# Patient Record
Sex: Male | Born: 1950 | Race: White | Hispanic: No | Marital: Married | Smoking: Never smoker
Health system: Southern US, Community
[De-identification: ages and names within clinical notes are randomized; demographics above are authoritative.]

## PROBLEM LIST (undated history)

## (undated) DIAGNOSIS — R42 Dizziness and giddiness: Secondary | ICD-10-CM

## (undated) DIAGNOSIS — E669 Obesity, unspecified: Secondary | ICD-10-CM

## (undated) DIAGNOSIS — E119 Type 2 diabetes mellitus without complications: Secondary | ICD-10-CM

## (undated) DIAGNOSIS — Z85820 Personal history of malignant melanoma of skin: Secondary | ICD-10-CM

## (undated) DIAGNOSIS — Z79899 Other long term (current) drug therapy: Secondary | ICD-10-CM

## (undated) DIAGNOSIS — K5900 Constipation, unspecified: Secondary | ICD-10-CM

## (undated) DIAGNOSIS — D72829 Elevated white blood cell count, unspecified: Secondary | ICD-10-CM

## (undated) DIAGNOSIS — D649 Anemia, unspecified: Secondary | ICD-10-CM

## (undated) DIAGNOSIS — M1990 Unspecified osteoarthritis, unspecified site: Secondary | ICD-10-CM

## (undated) DIAGNOSIS — Z85828 Personal history of other malignant neoplasm of skin: Secondary | ICD-10-CM

## (undated) DIAGNOSIS — Z8589 Personal history of malignant neoplasm of other organs and systems: Secondary | ICD-10-CM

## (undated) DIAGNOSIS — K219 Gastro-esophageal reflux disease without esophagitis: Secondary | ICD-10-CM

## (undated) DIAGNOSIS — F32A Depression, unspecified: Secondary | ICD-10-CM

## (undated) DIAGNOSIS — F5104 Psychophysiologic insomnia: Secondary | ICD-10-CM

## (undated) DIAGNOSIS — E785 Hyperlipidemia, unspecified: Secondary | ICD-10-CM

## (undated) DIAGNOSIS — N528 Other male erectile dysfunction: Secondary | ICD-10-CM

## (undated) DIAGNOSIS — F419 Anxiety disorder, unspecified: Secondary | ICD-10-CM

## (undated) DIAGNOSIS — F329 Major depressive disorder, single episode, unspecified: Secondary | ICD-10-CM

## (undated) HISTORY — DX: Major depressive disorder, single episode, unspecified: F32.9

## (undated) HISTORY — DX: Anxiety disorder, unspecified: F41.9

## (undated) HISTORY — DX: Hyperlipidemia, unspecified: E78.5

## (undated) HISTORY — DX: Other long term (current) drug therapy: Z79.899

## (undated) HISTORY — DX: Constipation, unspecified: K59.00

## (undated) HISTORY — DX: Obesity, unspecified: E66.9

## (undated) HISTORY — DX: Depression, unspecified: F32.A

## (undated) HISTORY — DX: Psychophysiologic insomnia: F51.04

## (undated) HISTORY — DX: Personal history of malignant melanoma of skin: Z85.820

## (undated) HISTORY — DX: Other male erectile dysfunction: N52.8

## (undated) HISTORY — DX: Personal history of malignant neoplasm of other organs and systems: Z85.89

## (undated) HISTORY — DX: Elevated white blood cell count, unspecified: D72.829

## (undated) HISTORY — DX: Unspecified osteoarthritis, unspecified site: M19.90

## (undated) HISTORY — DX: Anemia, unspecified: D64.9

## (undated) HISTORY — DX: Gastro-esophageal reflux disease without esophagitis: K21.9

## (undated) HISTORY — DX: Type 2 diabetes mellitus without complications: E11.9

## (undated) HISTORY — DX: Personal history of other malignant neoplasm of skin: Z85.828

## (undated) HISTORY — PX: NASAL SEPTUM SURGERY: SHX37

## (undated) HISTORY — PX: COLONOSCOPY: SHX174

---

## 2007-02-03 LAB — HM COLONOSCOPY

## 2007-02-15 ENCOUNTER — Ambulatory Visit: Payer: Self-pay | Admitting: Gastroenterology

## 2012-09-10 LAB — PSA: PSA: NORMAL

## 2014-08-25 LAB — LIPID PANEL
CHOLESTEROL: 139 mg/dL (ref 0–200)
HDL: 52 mg/dL (ref 35–70)
LDL Cholesterol: 51 mg/dL
Triglycerides: 182 mg/dL — AB (ref 40–160)

## 2014-09-29 ENCOUNTER — Emergency Department: Payer: Self-pay | Admitting: Emergency Medicine

## 2014-09-29 LAB — CBC
HCT: 40.4 % (ref 40.0–52.0)
HGB: 12.9 g/dL — AB (ref 13.0–18.0)
MCH: 26.8 pg (ref 26.0–34.0)
MCHC: 31.9 g/dL — ABNORMAL LOW (ref 32.0–36.0)
MCV: 84 fL (ref 80–100)
PLATELETS: 203 10*3/uL (ref 150–440)
RBC: 4.8 10*6/uL (ref 4.40–5.90)
RDW: 15.5 % — AB (ref 11.5–14.5)
WBC: 11.2 10*3/uL — AB (ref 3.8–10.6)

## 2014-09-29 LAB — BASIC METABOLIC PANEL
Anion Gap: 7 (ref 7–16)
BUN: 10 mg/dL (ref 7–18)
CALCIUM: 8.6 mg/dL (ref 8.5–10.1)
Chloride: 104 mmol/L (ref 98–107)
Co2: 30 mmol/L (ref 21–32)
Creatinine: 0.96 mg/dL (ref 0.60–1.30)
EGFR (Non-African Amer.): >60
Glucose: 191 mg/dL — ABNORMAL HIGH (ref 65–99)
Osmolality: 285 (ref 275–301)
POTASSIUM: 4.1 mmol/L (ref 3.5–5.1)
SODIUM: 141 mmol/L (ref 136–145)

## 2014-09-29 LAB — TROPONIN I: Troponin-I: <0.02 ng/mL

## 2015-04-21 LAB — HEMOGLOBIN A1C: Hgb A1c MFr Bld: 6 % (ref 4.0–6.0)

## 2015-07-13 LAB — HM DIABETES EYE EXAM

## 2015-07-19 ENCOUNTER — Encounter: Payer: Self-pay | Admitting: Family Medicine

## 2015-07-19 DIAGNOSIS — F339 Major depressive disorder, recurrent, unspecified: Secondary | ICD-10-CM | POA: Insufficient documentation

## 2015-07-19 DIAGNOSIS — N529 Male erectile dysfunction, unspecified: Secondary | ICD-10-CM | POA: Insufficient documentation

## 2015-07-19 DIAGNOSIS — E1140 Type 2 diabetes mellitus with diabetic neuropathy, unspecified: Secondary | ICD-10-CM | POA: Insufficient documentation

## 2015-07-19 DIAGNOSIS — E785 Hyperlipidemia, unspecified: Secondary | ICD-10-CM | POA: Insufficient documentation

## 2015-07-19 DIAGNOSIS — Z8619 Personal history of other infectious and parasitic diseases: Secondary | ICD-10-CM | POA: Insufficient documentation

## 2015-07-19 DIAGNOSIS — K5900 Constipation, unspecified: Secondary | ICD-10-CM | POA: Insufficient documentation

## 2015-07-19 DIAGNOSIS — Z8589 Personal history of malignant neoplasm of other organs and systems: Secondary | ICD-10-CM | POA: Insufficient documentation

## 2015-07-19 DIAGNOSIS — F419 Anxiety disorder, unspecified: Secondary | ICD-10-CM | POA: Insufficient documentation

## 2015-07-19 DIAGNOSIS — N521 Erectile dysfunction due to diseases classified elsewhere: Secondary | ICD-10-CM

## 2015-07-19 DIAGNOSIS — Z85820 Personal history of malignant melanoma of skin: Secondary | ICD-10-CM | POA: Insufficient documentation

## 2015-07-19 DIAGNOSIS — Z85828 Personal history of other malignant neoplasm of skin: Secondary | ICD-10-CM | POA: Insufficient documentation

## 2015-07-19 DIAGNOSIS — E669 Obesity, unspecified: Secondary | ICD-10-CM | POA: Insufficient documentation

## 2015-07-19 DIAGNOSIS — G4700 Insomnia, unspecified: Secondary | ICD-10-CM | POA: Insufficient documentation

## 2015-07-19 DIAGNOSIS — K219 Gastro-esophageal reflux disease without esophagitis: Secondary | ICD-10-CM | POA: Insufficient documentation

## 2015-07-23 ENCOUNTER — Ambulatory Visit: Payer: Self-pay | Admitting: Family Medicine

## 2015-07-29 ENCOUNTER — Ambulatory Visit (INDEPENDENT_AMBULATORY_CARE_PROVIDER_SITE_OTHER): Payer: Medicare Other | Admitting: Family Medicine

## 2015-07-29 ENCOUNTER — Encounter: Payer: Self-pay | Admitting: Family Medicine

## 2015-07-29 VITALS — BP 110/62 | HR 85 | Temp 98.7°F | Resp 14 | Ht 69.0 in | Wt 219.4 lb

## 2015-07-29 DIAGNOSIS — Z79899 Other long term (current) drug therapy: Secondary | ICD-10-CM | POA: Diagnosis not present

## 2015-07-29 DIAGNOSIS — E1140 Type 2 diabetes mellitus with diabetic neuropathy, unspecified: Principal | ICD-10-CM

## 2015-07-29 DIAGNOSIS — N521 Erectile dysfunction due to diseases classified elsewhere: Secondary | ICD-10-CM | POA: Diagnosis not present

## 2015-07-29 DIAGNOSIS — G4700 Insomnia, unspecified: Secondary | ICD-10-CM | POA: Diagnosis not present

## 2015-07-29 DIAGNOSIS — D649 Anemia, unspecified: Secondary | ICD-10-CM

## 2015-07-29 DIAGNOSIS — F419 Anxiety disorder, unspecified: Secondary | ICD-10-CM | POA: Diagnosis not present

## 2015-07-29 DIAGNOSIS — F339 Major depressive disorder, recurrent, unspecified: Secondary | ICD-10-CM

## 2015-07-29 DIAGNOSIS — E1149 Type 2 diabetes mellitus with other diabetic neurological complication: Secondary | ICD-10-CM | POA: Diagnosis not present

## 2015-07-29 DIAGNOSIS — M542 Cervicalgia: Secondary | ICD-10-CM | POA: Insufficient documentation

## 2015-07-29 DIAGNOSIS — E669 Obesity, unspecified: Secondary | ICD-10-CM | POA: Diagnosis not present

## 2015-07-29 DIAGNOSIS — Z23 Encounter for immunization: Secondary | ICD-10-CM

## 2015-07-29 DIAGNOSIS — N528 Other male erectile dysfunction: Secondary | ICD-10-CM | POA: Diagnosis not present

## 2015-07-29 DIAGNOSIS — E66811 Obesity, class 1: Secondary | ICD-10-CM

## 2015-07-29 DIAGNOSIS — E785 Hyperlipidemia, unspecified: Secondary | ICD-10-CM

## 2015-07-29 LAB — POCT GLYCOSYLATED HEMOGLOBIN (HGB A1C): HEMOGLOBIN A1C: 6.2

## 2015-07-29 MED ORDER — AMPHETAMINE-DEXTROAMPHETAMINE 15 MG PO TABS: 1.0000 | ORAL_TABLET | Freq: Two times a day (BID) | ORAL | Status: DC

## 2015-07-29 MED ORDER — AMPHETAMINE-DEXTROAMPHETAMINE 15 MG PO TABS: 15.0000 mg | ORAL_TABLET | Freq: Every day | ORAL | Status: DC

## 2015-07-29 MED ORDER — TIZANIDINE HCL 4 MG PO CAPS: 4.0000 mg | ORAL_CAPSULE | Freq: Three times a day (TID) | ORAL | Status: DC

## 2015-07-29 MED ORDER — SILDENAFIL CITRATE 20 MG PO TABS: 20.0000 mg | ORAL_TABLET | ORAL | Status: DC | PRN

## 2015-07-29 MED ORDER — ASPIRIN EC 81 MG PO TBEC: 81.0000 mg | DELAYED_RELEASE_TABLET | Freq: Every day | ORAL | Status: DC

## 2015-07-29 MED ORDER — ATORVASTATIN CALCIUM 80 MG PO TABS: 80.0000 mg | ORAL_TABLET | Freq: Every day | ORAL | Status: DC

## 2015-07-29 MED ORDER — ALPRAZOLAM 0.5 MG PO TABS: 0.5000 mg | ORAL_TABLET | ORAL | Status: DC | PRN

## 2015-07-29 MED ORDER — METFORMIN HCL 500 MG PO TABS: 500.0000 mg | ORAL_TABLET | Freq: Two times a day (BID) | ORAL | Status: DC

## 2015-07-29 MED ORDER — VALACYCLOVIR HCL 1 G PO TABS: 1000.0000 mg | ORAL_TABLET | ORAL | Status: DC | PRN

## 2015-07-29 MED ORDER — BUPROPION HCL ER (XL) 300 MG PO TB24: 300.0000 mg | ORAL_TABLET | Freq: Every day | ORAL | Status: DC

## 2015-07-29 MED ORDER — DOCUSATE SODIUM 100 MG PO CAPS: 100.0000 mg | ORAL_CAPSULE | Freq: Two times a day (BID) | ORAL | Status: DC

## 2015-07-29 MED ORDER — OMEPRAZOLE 40 MG PO CPDR: 40.0000 mg | DELAYED_RELEASE_CAPSULE | Freq: Every day | ORAL | Status: DC

## 2015-07-29 NOTE — Progress Notes (Signed)
Name: Diane Mochizuki.   MRN: [id number]    DOB: 01-14-1951   Date:07/29/2015       Progress Note  Subjective  Chief Complaint  Chief Complaint  Patient presents with  . Diabetes  . Depression  . Hyperlipidemia  . Gastrophageal Reflux    HPI  DMII with ED: he is taking Metformin, weight is stable, denies polyphagia, polydipsia or polyuria. He has ED but is doing well on medication  ED: taking generic Viagra and is doing well, denies side effects of medication  Major Depressive Disorder: he states he is doing well, taking his medications, doing well with his new routine since his wife retired last year. He takes Adderal to help with his energy and focus, he is happy with therapy at this time.   GERD: he takes medication daily, if he skips medication symptoms are severe , he is aware of possible side effects of medication.   Neck pain: he has noticed that his neck is very sore on the left side for the past few months, worse at the end of the day, feels tight.  He needs to shift his position to make it better  Patient Active Problem List   Diagnosis Date Noted  . Anxiety 07/19/2015  . Insomnia, persistent 07/19/2015  . CN (constipation) 07/19/2015  . 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 Dyslipidemia 07/19/2015  . Gastric reflux 07/19/2015  . History of shingles 07/19/2015  . History of basal cell cancer 07/19/2015  . H/O Malignant melanoma 07/19/2015  . Personal history of malignant neoplasm of head and neck 07/19/2015  . Chronic recurrent major depressive disorder 07/19/2015  . Obesity (BMI 30.0-34.9) 07/19/2015  . ED (erectile dysfunction) 07/19/2015    Past Surgical History  Procedure Laterality Date  . Nasal septum surgery      Family History  Problem Relation Age of Onset  . Diabetes Mother   . Heart disease Mother   . Hyperlipidemia Mother   . CVA Father   . Cancer Father     Colon  . Depression Father     Social  History   Social History  . Marital Status: Married    Spouse Name: N/A  . Number of Children: N/A  . Years of Education: N/A   Occupational History  . Not on file.   Social History Main Topics  . Smoking status: Never Smoker   . Smokeless tobacco: Never Used  . Alcohol Use: 0.0 oz/week    0 Standard drinks or equivalent per week  . Drug Use: No  . Sexual Activity: Yes   Other Topics Concern  . Not on file   Social History Narrative     Current outpatient prescriptions:  .  ALPRAZolam (XANAX) 0.5 MG tablet, Take 1 tablet (0.5 mg total) by mouth as needed., Disp: 60 tablet, Rfl: 2 .  amphetamine-dextroamphetamine (ADDERALL) 15 MG tablet, Take 1 tablet by mouth 2 (two) times daily., Disp: 60 tablet, Rfl: 0 .  atorvastatin (LIPITOR) 80 MG tablet, Take 1 tablet (80 mg total) by mouth daily., Disp: 90 tablet, Rfl: 1 .  buPROPion (WELLBUTRIN XL) 300 MG 24 hr tablet, Take 1 tablet (300 mg total) by mouth daily., Disp: 90 tablet, Rfl: 1 .  metFORMIN (GLUCOPHAGE) 500 MG tablet, Take 1 tablet (500 mg total) by mouth 2 (two) times daily., Disp: 90 tablet, Rfl: 1 .  omeprazole (PRILOSEC) 40 MG capsule, Take 1 capsule (40 mg total) by mouth daily., Disp: 90  capsule, Rfl: 1 .  sildenafil (REVATIO) 20 MG tablet, Take 1 tablet (20 mg total) by mouth as needed., Disp: 20 tablet, Rfl: 5 .  valACYclovir (VALTREX) 1000 MG tablet, Take 1 tablet (1,000 mg total) by mouth as needed., Disp: 30 tablet, Rfl: 0 .  amphetamine-dextroamphetamine (ADDERALL) 15 MG tablet, Take 1 tablet by mouth daily., Disp: 60 tablet, Rfl: 0 .  amphetamine-dextroamphetamine (ADDERALL) 15 MG tablet, Take 1 tablet by mouth daily., Disp: 60 tablet, Rfl: 0 .  aspirin EC 81 MG tablet, Take 1 tablet (81 mg total) by mouth daily., Disp: 30 tablet, Rfl: 0 .  docusate sodium (COLACE) 100 MG capsule, Take 1 capsule (100 mg total) by mouth 2 (two) times daily., Disp: 30 capsule, Rfl: 0 .  tiZANidine (ZANAFLEX) 4 MG capsule, Take 1  capsule (4 mg total) by mouth 3 (three) times daily., Disp: 90 capsule, Rfl: 1  No Known Allergies   ROS  Constitutional: Negative for fever or weight change.  Respiratory: Negative for cough and shortness of breath.   Cardiovascular: Negative for chest pain or palpitations.  Gastrointestinal: Negative for abdominal pain, no bowel changes.  Musculoskeletal: Negative for gait problem or joint swelling.  Skin: Negative for rash.  Neurological: Negative for dizziness or headache.  No other specific complaints in a complete review of systems (except as listed in HPI above).  Objective  Filed Vitals:   07/29/15 1207  BP: 110/62  Pulse: 85  Temp: 98.7 F (37.1 C)  TempSrc: Oral  Resp: 14  Height: 5\' 9"  (1.753 m)  Weight: 219 lb 6.4 oz (99.519 kg)  SpO2: 95%    Body mass index is 32.38 kg/(m^2).  Physical Exam  Constitutional: Patient appears well-developed and well-nourished. Obese  No distress.  HEENT: head atraumatic, normocephalic, pupils equal and reactive to light, neck supple, throat within normal limits Cardiovascular: Normal rate, regular rhythm and normal heart sounds.  No murmur heard. No BLE edema. Pulmonary/Chest: Effort normal and breath sounds normal. No respiratory distress. Abdominal: Soft.  There is no tenderness. Psychiatric: Patient has a normal mood and affect. behavior is normal. Judgment and thought content normal. Muscular Skeletal: neck pain on left lateral area around C-7, normal ROM of neck  Recent Results (from the past 2160 hour(s))  HM DIABETES EYE EXAM     Status: None   Collection Time: 07/13/15 12:00 AM  Result Value Ref Range   HM Diabetic Eye Exam No Retinopathy No Retinopathy    Comment: Done by [facility], and recommends yearly exam  POCT HgB A1C     Status: Abnormal   Collection Time: 07/29/15 12:19 PM  Result Value Ref Range   Hemoglobin A1C 6.2     Diabetic Foot Exam: Diabetic Foot Exam - Simple   Simple Foot Form   Visual Inspection  No deformities, no ulcerations, no other skin breakdown bilaterally:  Yes  Sensation Testing  Intact to touch and monofilament testing bilaterally:  Yes  Pulse Check  Posterior Tibialis and Dorsalis pulse intact bilaterally:  Yes  Comments      PHQ2/9: Depression screen PHQ 2/9 07/29/2015  Decreased Interest 1  Down, Depressed, Hopeless 1  PHQ - 2 Score 2  Altered sleeping 0  Tired, decreased energy 1  Change in appetite 1  Feeling bad or failure about yourself  0  Trouble concentrating 0  Moving slowly or fidgety/restless 0  Suicidal thoughts 0  PHQ-9 Score 4  Difficult doing work/chores Not difficult at all  Doing well on medication   Fall Risk: Fall Risk  07/29/2015  Falls in the past year? No     Assessment & Plan  1. 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 Needs flu shot High dose today  3. Need for pneumococcal vaccination  - Pneumococcal conjugate vaccine 13-valent IM  4. Obesity (BMI 30.0-34.9) Discussed with the patient the risk posed by an increased BMI. Discussed importance of portion control, calorie counting and at least 150 minutes of physical activity weekly. Avoid sweet beverages and drink more water. Eat at least 6 servings of fruit and vegetables daily   5. Chronic recurrent major depressive disorder Doing well on medication   6. Dyslipidemia  - Lipid panel  7. Insomnia, persistent Sleeping well now  8. Anemia, unspecified anemia type  - CBC with Differential/Platelet  9. Other male erectile dysfunction Continue generic Viagra  10. Long-term use of high-risk medication  - Comprehensive metabolic panel  11. Anxiety Continue Alprazolam prn

## 2015-08-31 ENCOUNTER — Telehealth: Payer: Self-pay

## 2015-08-31 NOTE — Telephone Encounter (Signed)
Pharmacist called regarding Adderall prescription, stating one of the prescriptions stated take 1 tablet two times daily, but the other two prescriptions stated one tablet daily with quantity 60 for 30 days. I informed the pharmacist from the old chart in [employer] patient has always taken 1 tablet two times daily and Dr. Ancil Boozer probably just had the wrong instructions on the other two prescriptions, but the first one seemed to be right with what he was always given in the past.

## 2015-09-30 DIAGNOSIS — Z85828 Personal history of other malignant neoplasm of skin: Secondary | ICD-10-CM | POA: Insufficient documentation

## 2015-10-05 ENCOUNTER — Other Ambulatory Visit: Payer: Self-pay | Admitting: Family Medicine

## 2015-10-05 NOTE — Telephone Encounter (Signed)
Patient requesting refill. 

## 2015-10-12 ENCOUNTER — Telehealth: Payer: Self-pay | Admitting: Family Medicine

## 2015-10-12 ENCOUNTER — Other Ambulatory Visit: Payer: Self-pay | Admitting: Family Medicine

## 2015-10-12 MED ORDER — ALPRAZOLAM 0.5 MG PO TABS: 0.5000 mg | ORAL_TABLET | Freq: Two times a day (BID) | ORAL | Status: DC | PRN

## 2015-10-12 NOTE — Telephone Encounter (Signed)
Shane Boyd from [place] need new script for [facility]. [name] usually take it twice daily, however, they received a hard copy with directions for him to take it by mouth once daily with a quantity of 60. He will need a new prescription for the patient to take it twice daily #60 for insurance to cover.

## 2015-10-12 NOTE — Telephone Encounter (Signed)
Called in.

## 2015-11-07 LAB — COMPREHENSIVE METABOLIC PANEL
A/G RATIO: 2 (ref 1.1–2.5)
ALBUMIN: 4.3 g/dL (ref 3.6–4.8)
ALK PHOS: 118 IU/L — AB (ref 39–117)
ALT: 15 IU/L (ref 0–44)
AST: 22 IU/L (ref 0–40)
BUN / CREAT RATIO: 14 (ref 10–22)
BUN: 13 mg/dL (ref 8–27)
Bilirubin Total: 0.3 mg/dL (ref 0.0–1.2)
CO2: 25 mmol/L (ref 18–29)
CREATININE: 0.91 mg/dL (ref 0.76–1.27)
Calcium: 9.4 mg/dL (ref 8.6–10.2)
Chloride: 102 mmol/L (ref 97–106)
GFR calc Af Amer: 101 mL/min/{1.73_m2} (ref >59)
GFR, EST NON AFRICAN AMERICAN: 88 mL/min/{1.73_m2} (ref >59)
GLOBULIN, TOTAL: 2.1 g/dL (ref 1.5–4.5)
Glucose: 99 mg/dL (ref 65–99)
Potassium: 5.1 mmol/L (ref 3.5–5.2)
SODIUM: 144 mmol/L (ref 136–144)
Total Protein: 6.4 g/dL (ref 6.0–8.5)

## 2015-11-07 LAB — CBC WITH DIFFERENTIAL/PLATELET
BASOS: 0 %
Basophils Absolute: 0 10*3/uL (ref 0.0–0.2)
EOS (ABSOLUTE): 0.2 10*3/uL (ref 0.0–0.4)
EOS: 3 %
HEMATOCRIT: 39.7 % (ref 37.5–51.0)
Hemoglobin: 13.2 g/dL (ref 12.6–17.7)
Immature Grans (Abs): 0 10*3/uL (ref 0.0–0.1)
Immature Granulocytes: 0 %
LYMPHS ABS: 2.7 10*3/uL (ref 0.7–3.1)
Lymphs: 40 %
MCH: 26.8 pg (ref 26.6–33.0)
MCHC: 33.2 g/dL (ref 31.5–35.7)
MCV: 81 fL (ref 79–97)
MONOS ABS: 0.5 10*3/uL (ref 0.1–0.9)
Monocytes: 8 %
Neutrophils Absolute: 3.5 10*3/uL (ref 1.4–7.0)
Neutrophils: 49 %
Platelets: 235 10*3/uL (ref 150–379)
RBC: 4.92 x10E6/uL (ref 4.14–5.80)
RDW: 15.3 % (ref 12.3–15.4)
WBC: 6.9 10*3/uL (ref 3.4–10.8)

## 2015-11-07 LAB — HEMOGLOBIN A1C
Est. average glucose Bld gHb Est-mCnc: 140 mg/dL
Hgb A1c MFr Bld: 6.5 % — ABNORMAL HIGH (ref 4.8–5.6)

## 2015-11-07 LAB — LIPID PANEL
CHOLESTEROL TOTAL: 148 mg/dL (ref 100–199)
Chol/HDL Ratio: 3.8 ratio units (ref 0.0–5.0)
HDL: 39 mg/dL — AB (ref >39)
LDL Calculated: 60 mg/dL (ref 0–99)
TRIGLYCERIDES: 246 mg/dL — AB (ref 0–149)
VLDL CHOLESTEROL CAL: 49 mg/dL — AB (ref 5–40)

## 2015-11-11 ENCOUNTER — Ambulatory Visit (INDEPENDENT_AMBULATORY_CARE_PROVIDER_SITE_OTHER): Payer: Medicare Other | Admitting: Family Medicine

## 2015-11-11 ENCOUNTER — Encounter: Payer: Self-pay | Admitting: Family Medicine

## 2015-11-11 VITALS — BP 118/76 | HR 92 | Temp 98.0°F | Resp 18 | Ht 69.0 in | Wt 215.6 lb

## 2015-11-11 DIAGNOSIS — N529 Male erectile dysfunction, unspecified: Secondary | ICD-10-CM

## 2015-11-11 DIAGNOSIS — E1140 Type 2 diabetes mellitus with diabetic neuropathy, unspecified: Principal | ICD-10-CM

## 2015-11-11 DIAGNOSIS — E1149 Type 2 diabetes mellitus with other diabetic neurological complication: Secondary | ICD-10-CM | POA: Diagnosis not present

## 2015-11-11 DIAGNOSIS — E785 Hyperlipidemia, unspecified: Secondary | ICD-10-CM

## 2015-11-11 DIAGNOSIS — G4700 Insomnia, unspecified: Secondary | ICD-10-CM

## 2015-11-11 DIAGNOSIS — K219 Gastro-esophageal reflux disease without esophagitis: Secondary | ICD-10-CM | POA: Diagnosis not present

## 2015-11-11 DIAGNOSIS — Z23 Encounter for immunization: Secondary | ICD-10-CM

## 2015-11-11 DIAGNOSIS — N521 Erectile dysfunction due to diseases classified elsewhere: Secondary | ICD-10-CM

## 2015-11-11 DIAGNOSIS — F339 Major depressive disorder, recurrent, unspecified: Secondary | ICD-10-CM

## 2015-11-11 DIAGNOSIS — M542 Cervicalgia: Secondary | ICD-10-CM | POA: Diagnosis not present

## 2015-11-11 MED ORDER — METFORMIN HCL 500 MG PO TABS: 500.0000 mg | ORAL_TABLET | Freq: Two times a day (BID) | ORAL | Status: DC

## 2015-11-11 MED ORDER — AMPHETAMINE-DEXTROAMPHETAMINE 15 MG PO TABS: 15.0000 mg | ORAL_TABLET | Freq: Every day | ORAL | Status: DC

## 2015-11-11 MED ORDER — TIZANIDINE HCL 4 MG PO CAPS: ORAL_CAPSULE | ORAL | Status: DC

## 2015-11-11 MED ORDER — BUPROPION HCL ER (XL) 300 MG PO TB24: 300.0000 mg | ORAL_TABLET | Freq: Every day | ORAL | Status: DC

## 2015-11-11 MED ORDER — AMPHETAMINE-DEXTROAMPHETAMINE 15 MG PO TABS: 1.0000 | ORAL_TABLET | Freq: Two times a day (BID) | ORAL | Status: DC

## 2015-11-11 MED ORDER — OMEPRAZOLE 40 MG PO CPDR: 40.0000 mg | DELAYED_RELEASE_CAPSULE | Freq: Every day | ORAL | Status: DC

## 2015-11-11 MED ORDER — ATORVASTATIN CALCIUM 80 MG PO TABS: 80.0000 mg | ORAL_TABLET | Freq: Every day | ORAL | Status: DC

## 2015-11-11 NOTE — Progress Notes (Signed)
Name: Shane Boyd.   MRN: [id number]    DOB: March 02, 1951   Date:11/11/2015       Progress Note  Subjective  Chief Complaint  Chief Complaint  Patient presents with  . Medication Refill    follow-up  . Diabetes  . Depression  . Hyperlipidemia  . 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 He has ED but is doing well on medication. He states that he has not been compliant with his diet, eating potatoes, bread and candy bar. HgbA1C has increased but still at goal.  ED: taking generic Viagra and is doing well, denies side effects of medication.   Major Depressive Disorder: he states he is doing well, taking his medications, doing well with his new routine since his wife retired last year. He takes Adderal to help with his energy and focus, he is happy with therapy at this time. He has been racking his yard three times weekly. They are trying to motivate each other to join [employer] sne[employer] in 2017  GERD: he takes medication daily, if he skips medication symptoms are severe , he is aware of possible side effects of medication.He does not want to try weaning self off at this time   Neck pain: he has noticed that his neck was very sore on the left side about 6 months ago, on his last visit we gave him Zanaflex and he states he is doing much better, symptoms are intermittent. Described as a sore , spasms sometimes.   Patient Active Problem List   Diagnosis Date Noted  . Neck pain 07/29/2015  . Anxiety 07/19/2015  . Insomnia, persistent 07/19/2015  . CN (constipation) 07/19/2015  . 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 Dyslipidemia 07/19/2015  . Gastric reflux 07/19/2015  . History of shingles 07/19/2015  . History of basal cell cancer 07/19/2015  . H/O Malignant melanoma 07/19/2015  . Personal history of malignant neoplasm of head and neck 07/19/2015  .  Chronic recurrent major depressive disorder ([place]) 07/19/2015  . Obesity (BMI 30.0-34.9) 07/19/2015  . ED (erectile dysfunction) 07/19/2015    Past Surgical History  Procedure Laterality Date  . Nasal septum surgery      Family History  Problem Relation Age of Onset  . Diabetes Mother   . Heart disease Mother   . Hyperlipidemia Mother   . CVA Father   . Cancer Father     Colon  . Depression Father     Social History   Social History  . Marital Status: Married    Spouse Name: N/A  . Number of Children: N/A  . Years of Education: N/A   Occupational History  . Not on file.   Social History Main Topics  . Smoking status: Never Smoker   . Smokeless tobacco: Never Used  . Alcohol Use: 0.0 oz/week    0 Standard drinks or equivalent per week  . Drug Use: No  . Sexual Activity: Yes   Other Topics Concern  . Not on file   Social History Narrative     Current outpatient prescriptions:  .  ALPRAZolam (XANAX) 0.5 MG tablet, Take 1 tablet (0.5 mg total) by mouth 2 (two) times daily as needed., Disp: 60 tablet, Rfl: 2 .  amphetamine-dextroamphetamine (ADDERALL) 15 MG tablet, Take 1 tablet by mouth 2 (two) times daily., Disp: 60 tablet, Rfl: 0 .  amphetamine-dextroamphetamine (ADDERALL) 15 MG  tablet, Take 1 tablet by mouth daily., Disp: 60 tablet, Rfl: 0 .  amphetamine-dextroamphetamine (ADDERALL) 15 MG tablet, Take 1 tablet by mouth daily., Disp: 60 tablet, Rfl: 0 .  aspirin EC 81 MG tablet, Take 1 tablet (81 mg total) by mouth daily., Disp: 30 tablet, Rfl: 0 .  atorvastatin (LIPITOR) 80 MG tablet, Take 1 tablet (80 mg total) by mouth daily., Disp: 90 tablet, Rfl: 1 .  buPROPion (WELLBUTRIN XL) 300 MG 24 hr tablet, Take 1 tablet (300 mg total) by mouth daily., Disp: 90 tablet, Rfl: 1 .  docusate sodium (COLACE) 100 MG capsule, Take 1 capsule (100 mg total) by mouth 2 (two) times daily., Disp: 30 capsule, Rfl: 0 .  metFORMIN (GLUCOPHAGE) 500 MG tablet, Take 1 tablet (500 mg  total) by mouth 2 (two) times daily., Disp: 180 tablet, Rfl: 1 .  omeprazole (PRILOSEC) 40 MG capsule, Take 1 capsule (40 mg total) by mouth daily., Disp: 90 capsule, Rfl: 1 .  sildenafil (REVATIO) 20 MG tablet, Take 1 tablet (20 mg total) by mouth as needed., Disp: 20 tablet, Rfl: 5 .  tiZANidine (ZANAFLEX) 4 MG capsule, take 1 capsule by mouth three times a day, Disp: 90 capsule, Rfl: 2 .  valACYclovir (VALTREX) 1000 MG tablet, Take 1 tablet (1,000 mg total) by mouth as needed., Disp: 30 tablet, Rfl: 0  No Known Allergies   ROS  Constitutional: Negative for fever or weight change.  Respiratory: Negative for cough and shortness of breath.   Cardiovascular: Negative for chest pain or palpitations.  Gastrointestinal: Negative for abdominal pain, no bowel changes.  Musculoskeletal: Negative for gait problem or joint swelling.  Skin: Negative for rash.  Neurological: Negative for dizziness or headache.  No other specific complaints in a complete review of systems (except as listed in HPI above).  Objective  Filed Vitals:   11/11/15 1202  BP: 118/76  Pulse: 92  Temp: 98 F (36.7 C)  TempSrc: Oral  Resp: 18  Height: 5\' 9"  (1.753 m)  Weight: 215 lb 9.6 oz (97.796 kg)  SpO2: 96%    Body mass index is 31.82 kg/(m^2).  Physical Exam  Constitutional: Patient appears well-developed and well-nourished. Obese No distress.  HEENT: head atraumatic, normocephalic, pupils equal and reactive to light, neck supple, throat within normal limits Cardiovascular: Normal rate, regular rhythm and normal heart sounds.  No murmur heard. No BLE edema. Pulmonary/Chest: Effort normal and breath sounds normal. No respiratory distress. Abdominal: Soft.  There is no tenderness. Psychiatric: Patient has a normal mood and affect. behavior is normal. Judgment and thought content normal.  Recent Results (from the past 2160 hour(s))  Lipid panel     Status: Abnormal   Collection Time: 11/06/15  8:32 AM   Result Value Ref Range   Cholesterol, Total 148 100 - 199 mg/dL   Triglycerides 246 (H) 0 - 149 mg/dL   HDL 39 (L) >39 mg/dL   VLDL Cholesterol Cal 49 (H) 5 - 40 mg/dL   LDL Calculated 60 0 - 99 mg/dL   Chol/HDL Ratio 3.8 0.0 - 5.0 ratio units    Comment:                                   T. Chol/HDL Ratio  Men  Women                               1/2 Avg.Risk  3.4    3.3                                   Avg.Risk  5.0    4.4                                2X Avg.Risk  9.6    7.1                                3X Avg.Risk 23.4   11.0   Comprehensive metabolic panel     Status: Abnormal   Collection Time: 11/06/15  8:32 AM  Result Value Ref Range   Glucose 99 65 - 99 mg/dL   BUN 13 8 - 27 mg/dL   Creatinine, Ser 0.91 0.76 - 1.27 mg/dL   GFR calc non Af Amer 88 >59 mL/min/1.73   GFR calc Af Amer 101 >59 mL/min/1.73   BUN/Creatinine Ratio 14 10 - 22   Sodium 144 136 - 144 mmol/L    Comment: **Effective November 16, 2015 the reference interval**   for Sodium, Serum will be changing to:                                             134 - 144    Potassium 5.1 3.5 - 5.2 mmol/L   Chloride 102 97 - 106 mmol/L    Comment: **Effective November 16, 2015 the reference interval**   for Chloride, Serum will be changing to:                                              96 - 106    CO2 25 18 - 29 mmol/L   Calcium 9.4 8.6 - 10.2 mg/dL   Total Protein 6.4 6.0 - 8.5 g/dL   Albumin 4.3 3.6 - 4.8 g/dL   Globulin, Total 2.1 1.5 - 4.5 g/dL   Albumin/Globulin Ratio 2.0 1.1 - 2.5   Bilirubin Total 0.3 0.0 - 1.2 mg/dL   Alkaline Phosphatase 118 (H) 39 - 117 IU/L   AST 22 0 - 40 IU/L   ALT 15 0 - 44 IU/L  CBC with Differential/Platelet     Status: None   Collection Time: 11/06/15  8:32 AM  Result Value Ref Range   WBC 6.9 3.4 - 10.8 x10E3/uL   RBC 4.92 4.14 - 5.80 x10E6/uL   Hemoglobin 13.2 12.6 - 17.7 g/dL   Hematocrit 39.7 37.5 - 51.0 %   MCV 81 79 -  97 fL   MCH 26.8 26.6 - 33.0 pg   MCHC 33.2 31.5 - 35.7 g/dL   RDW 15.3 12.3 - 15.4 %   Platelets 235 150 - 379 x10E3/uL   Neutrophils 49 %   Lymphs 40 %   Monocytes 8 %   Eos 3 %   Basos 0 %  Neutrophils Absolute 3.5 1.4 - 7.0 x10E3/uL   Lymphocytes Absolute 2.7 0.7 - 3.1 x10E3/uL   Monocytes Absolute 0.5 0.1 - 0.9 x10E3/uL   EOS (ABSOLUTE) 0.2 0.0 - 0.4 x10E3/uL   Basophils Absolute 0.0 0.0 - 0.2 x10E3/uL   Immature Granulocytes 0 %   Immature Grans (Abs) 0.0 0.0 - 0.1 x10E3/uL  Hemoglobin A1c     Status: Abnormal   Collection Time: 11/06/15  8:32 AM  Result Value Ref Range   Hgb A1c MFr Bld 6.5 (H) 4.8 - 5.6 %    Comment:          Pre-diabetes: 5.7 - 6.4          Diabetes: >6.4          Glycemic control for adults with diabetes: <7.0    Est. average glucose Bld gHb Est-mCnc 140 mg/dL     PHQ2/9: Depression screen [facility] 2/9 11/11/2015 07/29/2015  Decreased Interest 0 1  Down, Depressed, Hopeless 1 1  PHQ - 2 Score 1 2  Altered sleeping - 0  Tired, decreased energy - 1  Change in appetite - 1  Feeling bad or failure about yourself  - 0  Trouble concentrating - 0  Moving slowly or fidgety/restless - 0  Suicidal thoughts - 0  PHQ-9 Score - 4  Difficult doing work/chores - Not difficult at all    Fall Risk: Fall Risk  11/11/2015 07/29/2015  Falls in the past year? No No    Functional Status Survey: Is the patient deaf or have difficulty hearing?: No Does the patient have difficulty seeing, even when wearing glasses/contacts?: Yes (glassses) Does the patient have difficulty concentrating, remembering, or making decisions?: No Does the patient have difficulty walking or climbing stairs?: No Does the patient have difficulty dressing or bathing?: No Does the patient have difficulty doing errands alone such as visiting a doctor's office or shopping?: No   Assessment & Plan  1. 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  Dispense: 180 tablet; Refill: 1  2. Insomnia, persistent  Continue prn medication   3. Need for pneumococcal vaccination  - Pneumococcal conjugate vaccine 13-valent IM - not given   4. Chronic recurrent major depressive disorder ([place])  Doing well on medication  - amphetamine-dextroamphetamine (ADDERALL) 15 MG tablet; Take 1 tablet by mouth 2 (two) times daily.  Dispense: 60 tablet; Refill: 0 - amphetamine-dextroamphetamine (ADDERALL) 15 MG tablet; Take 1 tablet by mouth daily.  Dispense: 60 tablet; Refill: 0 - amphetamine-dextroamphetamine (ADDERALL) 15 MG tablet; Take 1 tablet by mouth daily.  Dispense: 60 tablet; Refill: 0 - buPROPion (WELLBUTRIN XL) 300 MG 24 hr tablet; Take 1 tablet (300 mg total) by mouth daily.  Dispense: 90 tablet; Refill: 1  5. Dyslipidemia  Advised to  improve HDL patient  needs to eat tree nuts ( pecans/pistachios/almonds ) four times weekly, eat fish two times weekly  and exercise  at least 150 minutes per week   atorvastatin (LIPITOR) 80 MG tablet; Take 1 tablet (80 mg total) by mouth daily.  Dispense: 90 tablet; Refill: 1  6. Erectile dysfunction, unspecified erectile dysfunction type  Continue prn medication   7. Neck pain  Doing well with Zanaflex prn  - tiZANidine (ZANAFLEX) 4 MG capsule; take 1 capsule by mouth three times a day  Dispense: 90 capsule; Refill:  2  8. Gastric reflux  He does not want to stop medication  - omeprazole (PRILOSEC) 40 MG capsule; Take 1 capsule (40 mg total) by mouth daily.  Dispense: 90 capsule; Refill: 1

## 2015-11-18 ENCOUNTER — Other Ambulatory Visit: Payer: Self-pay | Admitting: Family Medicine

## 2015-11-18 ENCOUNTER — Telehealth: Payer: Self-pay

## 2015-11-18 DIAGNOSIS — F339 Major depressive disorder, recurrent, unspecified: Secondary | ICD-10-CM

## 2015-11-18 MED ORDER — AMPHETAMINE-DEXTROAMPHETAMINE 15 MG PO TABS: 15.0000 mg | ORAL_TABLET | Freq: Two times a day (BID) | ORAL | Status: DC

## 2015-11-18 NOTE — Telephone Encounter (Signed)
I called the pharmacy, you wrote the 2 out of the 3 adderall wrong.  So you will have to rewrite and he will have to pick up since controlled they will not take over the phone!  You wrote 1 tab qd [id number]

## 2015-11-18 NOTE — Telephone Encounter (Signed)
done

## 2015-11-23 ENCOUNTER — Telehealth: Payer: Self-pay | Admitting: Family Medicine

## 2015-11-23 NOTE — Telephone Encounter (Signed)
Patient wanted me to express his concerns about him and his wife's prescriptions being wrong.  He States him and his wife on the old rx's their metformin was written for only #90 instead of #180 and had problems with his adderall as well.  They have been corrected and the metformin was fixed at this last appointment but he said he was very concerned.

## 2015-11-23 NOTE — Telephone Encounter (Signed)
Yes I did verify and it was incorrect from the last old rx, this new one you just wrote in dec. Is correct.

## 2015-11-23 NOTE — Telephone Encounter (Signed)
Pt would like a call back about a prescription that has been written incorrectly. Pt states this is the third time over the past several weeks that his prescription has been written wrong. Metformin was written for 45 days and is supposed to be written for either 30 or 90 days.Pt is running out of this meds. He states he is not sure whats going on and wants a call back.

## 2015-11-23 NOTE — Telephone Encounter (Signed)
Did you verify? Was it incorrect?

## 2015-12-10 ENCOUNTER — Telehealth: Payer: Self-pay

## 2015-12-10 NOTE — Telephone Encounter (Signed)
Patient called due to not having any refills on his Alprazolam prescription from November and has been out since the beginning of December. I called [facility] and they informed me his prescription did not show any refills in which our system did have 2 refills. So I called in a 1 month supply with 1 refill per Dr. Ancil Boozer due to patient being out and pharmacy making this mistake.

## 2016-02-09 ENCOUNTER — Ambulatory Visit (INDEPENDENT_AMBULATORY_CARE_PROVIDER_SITE_OTHER): Payer: Medicare Other | Admitting: Family Medicine

## 2016-02-09 ENCOUNTER — Encounter: Payer: Self-pay | Admitting: Family Medicine

## 2016-02-09 VITALS — BP 120/78 | HR 101 | Temp 98.2°F | Resp 16 | Ht 69.0 in | Wt 221.5 lb

## 2016-02-09 DIAGNOSIS — G4700 Insomnia, unspecified: Secondary | ICD-10-CM

## 2016-02-09 DIAGNOSIS — E1140 Type 2 diabetes mellitus with diabetic neuropathy, unspecified: Secondary | ICD-10-CM | POA: Diagnosis not present

## 2016-02-09 DIAGNOSIS — N521 Erectile dysfunction due to diseases classified elsewhere: Secondary | ICD-10-CM | POA: Diagnosis not present

## 2016-02-09 DIAGNOSIS — Z23 Encounter for immunization: Secondary | ICD-10-CM | POA: Diagnosis not present

## 2016-02-09 DIAGNOSIS — K219 Gastro-esophageal reflux disease without esophagitis: Secondary | ICD-10-CM

## 2016-02-09 DIAGNOSIS — F339 Major depressive disorder, recurrent, unspecified: Secondary | ICD-10-CM | POA: Diagnosis not present

## 2016-02-09 DIAGNOSIS — E785 Hyperlipidemia, unspecified: Secondary | ICD-10-CM

## 2016-02-09 LAB — POCT GLYCOSYLATED HEMOGLOBIN (HGB A1C): HEMOGLOBIN A1C: 6.2

## 2016-02-09 MED ORDER — AMPHETAMINE-DEXTROAMPHETAMINE 15 MG PO TABS: 1.0000 | ORAL_TABLET | Freq: Two times a day (BID) | ORAL | Status: DC

## 2016-02-09 MED ORDER — AMPHETAMINE-DEXTROAMPHETAMINE 15 MG PO TABS: 15.0000 mg | ORAL_TABLET | Freq: Two times a day (BID) | ORAL | Status: DC

## 2016-02-09 MED ORDER — ALPRAZOLAM 1 MG PO TABS: 1.0000 mg | ORAL_TABLET | Freq: Two times a day (BID) | ORAL | Status: DC

## 2016-02-09 MED ORDER — CITALOPRAM HYDROBROMIDE 20 MG PO TABS: 20.0000 mg | ORAL_TABLET | Freq: Every day | ORAL | Status: DC

## 2016-02-09 NOTE — Progress Notes (Signed)
Name: Shane Boyd.   MRN: [id number]    DOB: [birth year]   Date:02/09/2016       Progress Note  Subjective  Chief Complaint  Chief Complaint  Patient presents with  . Diabetes    patient is here for a 48-month f/u. patient has not been checking his blood sugar. he states he has not had any negative s/s from his dm  . Insomnia  . Depression  . Erectile Dysfunction  . 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 He has ED . He states that he has not been compliant with his diet, eating potatoes, bread and candy bar. HgbA1C has improved since last visit  ED: taking generic Viagra and is doing well, denies side effects of medication.   Major Depressive Disorder: he is doing much worse, having sexual problems with his wife, and is very upset. Depression is worse. He is willing to try SSRI now, not worried about sexual side effects, but wants to feel better. Denies suicidal planning.   GERD: he takes medication daily, if he skips medication symptoms are severe , he is aware of possible side effects of medication.He does not want to try weaning self off at this time   Neck pain: he has noticed that his neck was very sore on the left side about 6 months ago, on his last visit we gave him Zanaflex and he states he is doing much better, symptoms are intermittent. Described as a sore , spasms sometimes. He states secondary to the way he sits down and reading from his phone  Dyslipidemia: taking statin therapy and denies myalgia.   Insomnia: taking Alprazolam prn and is working well for her  Patient Active Problem List   Diagnosis Date Noted  . Neck pain 07/29/2015  . Anxiety 07/19/2015  . Insomnia, persistent 07/19/2015  . CN (constipation) 07/19/2015  . 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 Dyslipidemia 07/19/2015  . Gastric reflux 07/19/2015  . History of  shingles 07/19/2015  . History of basal cell cancer 07/19/2015  . H/O Malignant melanoma 07/19/2015  . Personal history of malignant neoplasm of head and neck 07/19/2015  . Chronic recurrent major depressive disorder ([place]) 07/19/2015  . Obesity (BMI 30.0-34.9) 07/19/2015  . ED (erectile dysfunction) 07/19/2015    Past Surgical History  Procedure Laterality Date  . Nasal septum surgery      Family History  Problem Relation Age of Onset  . Diabetes Mother   . Heart disease Mother   . Hyperlipidemia Mother   . CVA Father   . Cancer Father     Colon  . Depression Father     Social History   Social History  . Marital Status: Married    Spouse Name: N/A  . Number of Children: N/A  . Years of Education: N/A   Occupational History  . retired    Social History Main Topics  . Smoking status: Never Smoker   . Smokeless tobacco: Never Used  . Alcohol Use: 0.0 oz/week    0 Standard drinks or equivalent per week  . Drug Use: No  . Sexual Activity: Yes   Other Topics Concern  . Not on file   Social History Narrative   One son: [name] lives in [place]   One son Trip , died from possible suicide while living in [place]     Current outpatient prescriptions:  .  ALPRAZolam (XANAX) 0.5 MG tablet, Take 1 tablet (0.5 mg total) by mouth 2 (two) times daily as needed., Disp: 60 tablet, Rfl: 2 .  ALPRAZolam (XANAX) 1 MG tablet, , Disp: , Rfl: 0 .  amphetamine-dextroamphetamine (ADDERALL) 15 MG tablet, Take 1 tablet by mouth 2 (two) times daily., Disp: 60 tablet, Rfl: 0 .  amphetamine-dextroamphetamine (ADDERALL) 15 MG tablet, Take 1 tablet by mouth 2 (two) times daily., Disp: 60 tablet, Rfl: 0 .  amphetamine-dextroamphetamine (ADDERALL) 15 MG tablet, Take 1 tablet by mouth 2 (two) times daily., Disp: 60 tablet, Rfl: 0 .  aspirin EC 81 MG tablet, Take 1 tablet (81 mg total) by mouth daily., Disp: 30 tablet, Rfl: 0 .  atorvastatin (LIPITOR) 80 MG tablet, Take 1 tablet (80 mg total) by  mouth daily., Disp: 90 tablet, Rfl: 1 .  buPROPion (WELLBUTRIN XL) 300 MG 24 hr tablet, Take 1 tablet (300 mg total) by mouth daily., Disp: 90 tablet, Rfl: 1 .  docusate sodium (COLACE) 100 MG capsule, Take 1 capsule (100 mg total) by mouth 2 (two) times daily., Disp: 30 capsule, Rfl: 0 .  metFORMIN (GLUCOPHAGE) 500 MG tablet, Take 1 tablet (500 mg total) by mouth 2 (two) times daily., Disp: 180 tablet, Rfl: 1 .  omeprazole (PRILOSEC) 40 MG capsule, Take 1 capsule (40 mg total) by mouth daily., Disp: 90 capsule, Rfl: 1 .  sildenafil (REVATIO) 20 MG tablet, Take 1 tablet (20 mg total) by mouth as needed., Disp: 20 tablet, Rfl: 5 .  tiZANidine (ZANAFLEX) 4 MG capsule, take 1 capsule by mouth three times a day, Disp: 90 capsule, Rfl: 2 .  valACYclovir (VALTREX) 1000 MG tablet, Take 1 tablet (1,000 mg total) by mouth as needed., Disp: 30 tablet, Rfl: 0  No Known Allergies   ROS  Constitutional: Negative for fever , positive for weight change.  Respiratory: Negative for cough and shortness of breath.   Cardiovascular: Negative for chest pain or palpitations.  Gastrointestinal: Negative for abdominal pain, no bowel changes.  Musculoskeletal: Negative for gait problem or joint swelling.  Skin: Negative for rash.  Neurological: Negative for dizziness or headache.  No other specific complaints in a complete review of systems (except as listed in HPI above).  Objective  Filed Vitals:   02/09/16 1142  BP: 120/78  Pulse: 101  Temp: 98.2 F (36.8 C)  TempSrc: Oral  Resp: 16  Height: 5\' 9"  (1.753 m)  Weight: 221 lb 8 oz (100.472 kg)  SpO2: 98%    Body mass index is 32.69 kg/(m^2).  Physical Exam  Constitutional: Patient appears well-developed and well-nourished. Obese  No distress.  HEENT: head atraumatic, normocephalic, pupils equal and reactive to light,neck supple, throat within normal limits Cardiovascular: Normal rate, regular rhythm and normal heart sounds.  No murmur heard. No  BLE edema. Pulmonary/Chest: Effort normal and breath sounds normal. No respiratory distress. Abdominal: Soft.  There is no tenderness. Psychiatric: Patient has a depressed mood,  behavior is normal. Judgment and thought content normal.  Recent Results (from the past 2160 hour(s))  POCT glycosylated hemoglobin (Hb A1C)     Status: Normal   Collection Time: 02/09/16 11:54 AM  Result Value Ref Range   Hemoglobin A1C 6.2      PHQ2/9: Depression screen [facility] 2/9 02/09/2016 11/11/2015 07/29/2015  Decreased Interest 0 0 1  Down, Depressed, Hopeless 0 1 1  PHQ - 2 Score 0 1 2  Altered sleeping - - 0  Tired, decreased energy - - 1  Change in appetite - - 1  Feeling bad or failure about yourself  - - 0  Trouble concentrating - - 0  Moving slowly or fidgety/restless - - 0  Suicidal thoughts - - 0  PHQ-9 Score - - 4  Difficult doing work/chores - - Not difficult at all    Fall Risk: Fall Risk  02/09/2016 11/11/2015 07/29/2015  Falls in the past year? Yes No No  Number falls in past yr: 1 - -  Injury with Fall? Yes - -    Functional Status Survey: Is the patient deaf or have difficulty hearing?: No Does the patient have difficulty seeing, even when wearing glasses/contacts?: No Does the patient have difficulty concentrating, remembering, or making decisions?: No Does the patient have difficulty walking or climbing stairs?: No Does the patient have difficulty dressing or bathing?: No Does the patient have difficulty doing errands alone such as visiting a doctor's office or shopping?: No    Assessment & Plan  1. 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 Chronic recurrent major depressive disorder [facility]  Discussed grieving through Hospice. He is willing to start an SSRI, discussed possible side effects of medications - amphetamine-dextroamphetamine (ADDERALL) 15 MG tablet; Take 1 tablet by  mouth 2 (two) times daily.  Dispense: 60 tablet; Refill: 0 - amphetamine-dextroamphetamine (ADDERALL) 15 MG tablet; Take 1 tablet by mouth 2 (two) times daily.  Dispense: 60 tablet; Refill: 0 - amphetamine-dextroamphetamine (ADDERALL) 15 MG tablet; Take 1 tablet by mouth 2 (two) times daily.  Dispense: 60 tablet; Refill: 0 - citalopram (CELEXA) 20 MG tablet; Take 1 tablet (20 mg total) by mouth daily.  Dispense: 30 tablet; Refill: 0  3. Need for pneumococcal vaccination  - Pneumococcal conjugate vaccine 13-valent IM  4. Insomnia, persistent  Continue medication , Alprazolam, also taking it during the day for anxiety  5. Dyslipidemia  Taking medication, up to date  6. Gastric reflux  Stable at this time

## 2016-03-09 ENCOUNTER — Encounter: Payer: Self-pay | Admitting: Family Medicine

## 2016-03-09 ENCOUNTER — Ambulatory Visit (INDEPENDENT_AMBULATORY_CARE_PROVIDER_SITE_OTHER): Payer: Medicare Other | Admitting: Family Medicine

## 2016-03-09 VITALS — BP 112/74 | HR 95 | Temp 98.2°F | Resp 14 | Wt 222.9 lb

## 2016-03-09 DIAGNOSIS — R42 Dizziness and giddiness: Secondary | ICD-10-CM | POA: Diagnosis not present

## 2016-03-09 DIAGNOSIS — F339 Major depressive disorder, recurrent, unspecified: Secondary | ICD-10-CM | POA: Diagnosis not present

## 2016-03-09 DIAGNOSIS — M542 Cervicalgia: Secondary | ICD-10-CM | POA: Diagnosis not present

## 2016-03-09 MED ORDER — TIZANIDINE HCL 4 MG PO CAPS: ORAL_CAPSULE | ORAL | Status: DC

## 2016-03-09 NOTE — Progress Notes (Signed)
Name: Shane Boyd.   MRN: [id number]    DOB: 11-16-1951   Date:03/09/2016       Progress Note  Subjective  Chief Complaint  Chief Complaint  Patient presents with  . Depression    chronic recurrent major  . Follow-up    1 month   . Dizziness    HPI  Dizziness: he states that for years he has episodes of dizziness/vertigo that is intermittent for the past 10 years. Episodes can last days, not associated with headache, tinnitus, or hearing loss. Usually associated with head movement, it has happened when he first woke up in the morning, and feels like the room is shaking, when severe associated with nausea and vomiting. Symptoms can last days. Symptoms improves when he stays still.  Last episode was last week, resolved now. He will call me back during his next episode for referral to ENT.   Major Depressive Disorder: he is feeling better now since started on Celexa, still has not talked to his wife about what triggered this episode, but is rehearsing his speech. He still has sexual problems with his wife, but Celexa has caused lack of libido and is helping somei. Denies suicidal planning.   Patient Active Problem List   Diagnosis Date Noted  . Dizziness 03/09/2016  . Neck pain 07/29/2015  . Anxiety 07/19/2015  . Insomnia, persistent 07/19/2015  . CN (constipation) 07/19/2015  . 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) 07/19/2015  . Dyslipidemia 07/19/2015  . Gastric reflux 07/19/2015  . History of shingles 07/19/2015  . History of basal cell cancer 07/19/2015  . H/O Malignant melanoma 07/19/2015  . Personal history of malignant neoplasm of head and neck 07/19/2015  . Chronic recurrent major depressive disorder ([place]) 07/19/2015  . Obesity (BMI 30.0-34.9) 07/19/2015  . ED (erectile dysfunction) 07/19/2015    Past Surgical History  Procedure Laterality Date  . Nasal septum surgery      Family History  Problem Relation Age of Onset  . Diabetes  Mother   . Heart disease Mother   . Hyperlipidemia Mother   . CVA Father   . Cancer Father     Colon  . Depression Father     Social History   Social History  . Marital Status: Married    Spouse Name: N/A  . Number of Children: N/A  . Years of Education: N/A   Occupational History  . retired    Social History Main Topics  . Smoking status: Never Smoker   . Smokeless tobacco: Never Used  . Alcohol Use: 0.0 oz/week    0 Standard drinks or equivalent per week  . Drug Use: No  . Sexual Activity: Yes   Other Topics Concern  . Not on file   Social History Narrative   One son: [name] lives in [place]   One son Trip , died from possible suicide while living in [place]     Current outpatient prescriptions:  .  ALPRAZolam (XANAX) 1 MG tablet, Take 1 tablet (1 mg total) by mouth 2 (two) times daily., Disp: 60 tablet, Rfl: 2 .  amphetamine-dextroamphetamine (ADDERALL) 15 MG tablet, Take 1 tablet by mouth 2 (two) times daily., Disp: 60 tablet, Rfl: 0 .  amphetamine-dextroamphetamine (ADDERALL) 15 MG tablet, Take 1 tablet by mouth 2 (two) times daily., Disp: 60 tablet, Rfl: 0 .  amphetamine-dextroamphetamine (ADDERALL) 15 MG tablet, Take 1 tablet by mouth 2 (two) times daily., Disp: 60 tablet, Rfl: 0 .  aspirin EC  81 MG tablet, Take 1 tablet (81 mg total) by mouth daily., Disp: 30 tablet, Rfl: 0 .  atorvastatin (LIPITOR) 80 MG tablet, Take 1 tablet (80 mg total) by mouth daily., Disp: 90 tablet, Rfl: 1 .  buPROPion (WELLBUTRIN XL) 300 MG 24 hr tablet, Take 1 tablet (300 mg total) by mouth daily., Disp: 90 tablet, Rfl: 1 .  citalopram (CELEXA) 20 MG tablet, Take 1 tablet (20 mg total) by mouth daily., Disp: 30 tablet, Rfl: 0 .  docusate sodium (COLACE) 100 MG capsule, Take 1 capsule (100 mg total) by mouth 2 (two) times daily., Disp: 30 capsule, Rfl: 0 .  metFORMIN (GLUCOPHAGE) 500 MG tablet, Take 1 tablet (500 mg total) by mouth 2 (two) times daily., Disp: 180 tablet, Rfl: 1 .   omeprazole (PRILOSEC) 40 MG capsule, Take 1 capsule (40 mg total) by mouth daily., Disp: 90 capsule, Rfl: 1 .  sildenafil (REVATIO) 20 MG tablet, Take 1 tablet (20 mg total) by mouth as needed., Disp: 20 tablet, Rfl: 5 .  tiZANidine (ZANAFLEX) 4 MG capsule, take 1 capsule by mouth three times a day, Disp: 90 capsule, Rfl: 2 .  valACYclovir (VALTREX) 1000 MG tablet, Take 1 tablet (1,000 mg total) by mouth as needed., Disp: 30 tablet, Rfl: 0  No Known Allergies   ROS  Ten systems reviewed and is negative except as mentioned in HPI   Objective  Filed Vitals:   03/09/16 1146  BP: 112/74  Pulse: 95  Temp: 98.2 F (36.8 C)  TempSrc: Oral  Resp: 14  Weight: 222 lb 14.4 oz (101.107 kg)  SpO2: 96%    Body mass index is 32.9 kg/(m^2).  Physical Exam  Constitutional: Patient appears well-developed and well-nourished. Obese  No distress.  HEENT: head atraumatic, normocephalic, pupils equal and reactive to light, ears TM normal bilaterally,  neck supple, throat within normal limits Cardiovascular: Normal rate, regular rhythm and normal heart sounds.  No murmur heard. No BLE edema. Pulmonary/Chest: Effort normal and breath sounds normal. No respiratory distress. Abdominal: Soft.  There is no tenderness. Psychiatric: Patient has a normal mood and affect. behavior is normal. Judgment and thought content normal. Neurological exam: Romberg negative, cranial nerves intact, normal sensation, no nystagmus  Recent Results (from the past 2160 hour(s))  POCT glycosylated hemoglobin (Hb A1C)     Status: Normal   Collection Time: 02/09/16 11:54 AM  Result Value Ref Range   Hemoglobin A1C 6.2      PHQ2/9: Depression screen [facility] 2/9 03/09/2016 02/09/2016 11/11/2015 07/29/2015  Decreased Interest 0 0 0 1  Down, Depressed, Hopeless 0 0 1 1  PHQ - 2 Score 0 0 1 2  Altered sleeping - - - 0  Tired, decreased energy - - - 1  Change in appetite - - - 1  Feeling bad or failure about yourself  - - - 0   Trouble concentrating - - - 0  Moving slowly or fidgety/restless - - - 0  Suicidal thoughts - - - 0  PHQ-9 Score - - - 4  Difficult doing work/chores - - - Not difficult at all     Fall Risk: Fall Risk  03/09/2016 02/09/2016 11/11/2015 07/29/2015  Falls in the past year? No Yes No No  Number falls in past yr: - 1 - -  Injury with Fall? - Yes - -     Functional Status Survey: Is the patient deaf or have difficulty hearing?: No Does the patient have difficulty seeing, even when wearing  glasses/contacts?: No Does the patient have difficulty concentrating, remembering, or making decisions?: No Does the patient have difficulty walking or climbing stairs?: No Does the patient have difficulty dressing or bathing?: No Does the patient have difficulty doing errands alone such as visiting a doctor's office or shopping?: No    Assessment & Plan  1. Chronic recurrent major depressive disorder (HCC)  Continue medication , he is feeling better 2. Dizziness  He will call back for referral to ENT if symptoms returns, seems to be BPPV  3. Neck pain  Doing well on medication and needs a refill - tiZANidine (ZANAFLEX) 4 MG capsule; take 1 capsule by mouth three times a day  Dispense: 90 capsule; Refill: 2

## 2016-03-16 ENCOUNTER — Other Ambulatory Visit: Payer: Self-pay | Admitting: Family Medicine

## 2016-03-17 ENCOUNTER — Other Ambulatory Visit: Payer: Self-pay

## 2016-03-17 NOTE — Telephone Encounter (Signed)
Patient requesting refill. 

## 2016-03-18 MED ORDER — CITALOPRAM HYDROBROMIDE 20 MG PO TABS: 20.0000 mg | ORAL_TABLET | Freq: Every day | ORAL | Status: DC

## 2016-05-10 ENCOUNTER — Other Ambulatory Visit: Payer: Self-pay | Admitting: Family Medicine

## 2016-05-10 DIAGNOSIS — E785 Hyperlipidemia, unspecified: Secondary | ICD-10-CM

## 2016-05-10 DIAGNOSIS — F339 Major depressive disorder, recurrent, unspecified: Secondary | ICD-10-CM

## 2016-05-10 MED ORDER — ATORVASTATIN CALCIUM 80 MG PO TABS: 80.0000 mg | ORAL_TABLET | Freq: Every day | ORAL | Status: DC

## 2016-05-10 MED ORDER — BUPROPION HCL ER (XL) 300 MG PO TB24: 300.0000 mg | ORAL_TABLET | Freq: Every day | ORAL | Status: DC

## 2016-05-12 ENCOUNTER — Other Ambulatory Visit: Payer: Self-pay | Admitting: Family Medicine

## 2016-05-13 ENCOUNTER — Other Ambulatory Visit: Payer: Self-pay | Admitting: Family Medicine

## 2016-05-13 DIAGNOSIS — F339 Major depressive disorder, recurrent, unspecified: Secondary | ICD-10-CM

## 2016-05-13 MED ORDER — AMPHETAMINE-DEXTROAMPHETAMINE 15 MG PO TABS: 15.0000 mg | ORAL_TABLET | Freq: Two times a day (BID) | ORAL | Status: DC

## 2016-06-03 ENCOUNTER — Other Ambulatory Visit: Payer: Self-pay | Admitting: Family Medicine

## 2016-06-03 DIAGNOSIS — K219 Gastro-esophageal reflux disease without esophagitis: Secondary | ICD-10-CM

## 2016-06-03 MED ORDER — OMEPRAZOLE 40 MG PO CPDR: 40.0000 mg | DELAYED_RELEASE_CAPSULE | Freq: Every day | ORAL | Status: DC

## 2016-06-08 ENCOUNTER — Encounter: Payer: Self-pay | Admitting: Family Medicine

## 2016-06-08 ENCOUNTER — Ambulatory Visit (INDEPENDENT_AMBULATORY_CARE_PROVIDER_SITE_OTHER): Payer: Medicare Other | Admitting: Family Medicine

## 2016-06-08 VITALS — BP 120/58 | HR 97 | Temp 98.0°F | Resp 18 | Wt 221.3 lb

## 2016-06-08 DIAGNOSIS — Z79899 Other long term (current) drug therapy: Secondary | ICD-10-CM

## 2016-06-08 DIAGNOSIS — F339 Major depressive disorder, recurrent, unspecified: Secondary | ICD-10-CM | POA: Diagnosis not present

## 2016-06-08 DIAGNOSIS — N529 Male erectile dysfunction, unspecified: Secondary | ICD-10-CM

## 2016-06-08 DIAGNOSIS — E785 Hyperlipidemia, unspecified: Secondary | ICD-10-CM

## 2016-06-08 DIAGNOSIS — E1140 Type 2 diabetes mellitus with diabetic neuropathy, unspecified: Secondary | ICD-10-CM

## 2016-06-08 DIAGNOSIS — N521 Erectile dysfunction due to diseases classified elsewhere: Secondary | ICD-10-CM

## 2016-06-08 DIAGNOSIS — F419 Anxiety disorder, unspecified: Secondary | ICD-10-CM

## 2016-06-08 DIAGNOSIS — A6000 Herpesviral infection of urogenital system, unspecified: Secondary | ICD-10-CM | POA: Diagnosis not present

## 2016-06-08 DIAGNOSIS — G4700 Insomnia, unspecified: Secondary | ICD-10-CM

## 2016-06-08 MED ORDER — ALPRAZOLAM ER 1 MG PO TB24: 1.0000 mg | ORAL_TABLET | Freq: Every day | ORAL | Status: DC

## 2016-06-08 MED ORDER — AMPHETAMINE-DEXTROAMPHETAMINE 15 MG PO TABS: 15.0000 mg | ORAL_TABLET | Freq: Two times a day (BID) | ORAL | Status: DC

## 2016-06-08 MED ORDER — AMPHETAMINE-DEXTROAMPHETAMINE 15 MG PO TABS: 1.0000 | ORAL_TABLET | Freq: Two times a day (BID) | ORAL | Status: DC

## 2016-06-08 MED ORDER — VALACYCLOVIR HCL 500 MG PO TABS: 500.0000 mg | ORAL_TABLET | Freq: Three times a day (TID) | ORAL | Status: DC

## 2016-06-08 MED ORDER — METFORMIN HCL 500 MG PO TABS: 500.0000 mg | ORAL_TABLET | Freq: Two times a day (BID) | ORAL | Status: DC

## 2016-06-08 NOTE — Progress Notes (Signed)
Name: Shane Boyd.   MRN: [id number]    DOB: [birth year]   Date:06/08/2016       Progress Note  Subjective  Chief Complaint  Chief Complaint  Patient presents with  . Medication Refill  . 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 He has ED . He lost 2 lbs since last visit. He is eating better now, he has decreased carbohydrate intake  ED: taking generic Viagra and is doing well, denies side effects of medication.   Major Depressive Disorder: he is doing better now, he states he stopped Celexa because of side effects. Sexual drive has improved, doing better at home. Denies suicidal ideation. They are going to see bands playing  GERD: he takes medication daily, if he skips medication symptoms are severe , he is aware of possible side effects of medication. He does not want to try weaning self off at this time .  Dizziness: resolved, no longer has sensation of movement with change in position  Dyslipidemia: taking statin therapy and denies myalgia. No chest pain.   Insomnia: taking Alprazolam prn and is working well for him, however discussed changing to Alprazolam XR, since he is taking immediate release twice daily for mild anxiety also.   Genital herpes: he has outbreaks on his buttocks about once a year and needs refill of medication    Patient Active Problem List   Diagnosis Date Noted  . Genital herpes 06/08/2016  . Dizziness 03/09/2016  . Neck pain 07/29/2015  . Anxiety 07/19/2015  . Insomnia, persistent 07/19/2015  . CN (constipation) 07/19/2015  . 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 Dyslipidemia 07/19/2015  . Gastric reflux 07/19/2015  . History of shingles 07/19/2015  . History of basal cell cancer 07/19/2015  . H/O Malignant melanoma 07/19/2015  . Personal history of malignant neoplasm of head and neck 07/19/2015  . Chronic recurrent major  depressive disorder ([place]) 07/19/2015  . Obesity (BMI 30.0-34.9) 07/19/2015  . ED (erectile dysfunction) 07/19/2015    Past Surgical History  Procedure Laterality Date  . Nasal septum surgery      Family History  Problem Relation Age of Onset  . Diabetes Mother   . Heart disease Mother   . Hyperlipidemia Mother   . CVA Father   . Cancer Father     Colon  . Depression Father     Social History   Social History  . Marital Status: Married    Spouse Name: N/A  . Number of Children: N/A  . Years of Education: N/A   Occupational History  . retired    Social History Main Topics  . Smoking status: Never Smoker   . Smokeless tobacco: Never Used  . Alcohol Use: 0.0 oz/week    0 Standard drinks or equivalent per week  . Drug Use: No  . Sexual Activity: Yes   Other Topics Concern  . Not on file   Social History Narrative   One son: Shane Boyd lives in [place]   One son Shane Boyd , died from possible suicide while living in [place]     Current outpatient prescriptions:  .  amphetamine-dextroamphetamine (ADDERALL) 15 MG tablet, Take 1 tablet by mouth 2 (two) times daily., Disp: 60 tablet, Rfl: 0 .  amphetamine-dextroamphetamine (ADDERALL) 15 MG tablet, Take 1 tablet by mouth 2 (two) times daily., Disp: 60 tablet, Rfl: 0 .  amphetamine-dextroamphetamine (ADDERALL) 15 MG tablet,  Take 1 tablet by mouth 2 (two) times daily., Disp: 60 tablet, Rfl: 0 .  aspirin EC 81 MG tablet, Take 1 tablet (81 mg total) by mouth daily., Disp: 30 tablet, Rfl: 0 .  atorvastatin (LIPITOR) 80 MG tablet, Take 1 tablet (80 mg total) by mouth daily., Disp: 90 tablet, Rfl: 1 .  buPROPion (WELLBUTRIN XL) 300 MG 24 hr tablet, Take 1 tablet (300 mg total) by mouth daily., Disp: 90 tablet, Rfl: 1 .  docusate sodium (COLACE) 100 MG capsule, Take 1 capsule (100 mg total) by mouth 2 (two) times daily., Disp: 30 capsule, Rfl: 0 .  metFORMIN (GLUCOPHAGE) 500 MG tablet, Take 1 tablet (500 mg total) by mouth 2 (two) times  daily., Disp: 180 tablet, Rfl: 1 .  omeprazole (PRILOSEC) 40 MG capsule, Take 1 capsule (40 mg total) by mouth daily., Disp: 90 capsule, Rfl: 1 .  sildenafil (REVATIO) 20 MG tablet, Take 1 tablet (20 mg total) by mouth as needed., Disp: 20 tablet, Rfl: 5 .  tiZANidine (ZANAFLEX) 4 MG capsule, take 1 capsule by mouth three times a day, Disp: 90 capsule, Rfl: 2 .  ALPRAZolam (XANAX XR) 1 MG 24 hr tablet, Take 1 tablet (1 mg total) by mouth daily., Disp: 30 tablet, Rfl: 2 .  valACYclovir (VALTREX) 500 MG tablet, Take 1 tablet (500 mg total) by mouth 3 (three) times daily., Disp: 30 tablet, Rfl: 2  No Known Allergies   ROS   Constitutional: Negative for fever or significant weight change.  Respiratory: Negative for cough and shortness of breath.   Cardiovascular: Negative for chest pain or palpitations.  Gastrointestinal: Negative for abdominal pain, no bowel changes.  Musculoskeletal: Negative for gait problem or joint swelling.  Skin: Negative for rash.  Neurological: Negative for dizziness or headache.  No other specific complaints in a complete review of systems (except as listed in HPI above).  Objective  Filed Vitals:   06/08/16 1159  BP: 120/58  Pulse: 97  Temp: 98 F (36.7 C)  TempSrc: Oral  Resp: 18  Weight: 221 lb 4.8 oz (100.381 kg)  SpO2: 98%    Body mass index is 32.67 kg/(m^2).  Physical Exam  Constitutional: Patient appears well-developed and well-nourished. Obese  No distress.  HEENT: head atraumatic, normocephalic, pupils equal and reactive to light,  neck supple, throat within normal limits Cardiovascular: Normal rate, regular rhythm and normal heart sounds.  No murmur heard. No BLE edema. Pulmonary/Chest: Effort normal and breath sounds normal. No respiratory distress. Abdominal: Soft.  There is no tenderness. Psychiatric: Patient has a normal mood and affect. behavior is normal. Judgment and thought content normal.  PHQ2/9: Depression screen [facility] 2/9  06/08/2016 03/09/2016 02/09/2016 11/11/2015 07/29/2015  Decreased Interest 0 0 0 0 1  Down, Depressed, Hopeless 0 0 0 1 1  PHQ - 2 Score 0 0 0 1 2  Altered sleeping - - - - 0  Tired, decreased energy - - - - 1  Change in appetite - - - - 1  Feeling bad or failure about yourself  - - - - 0  Trouble concentrating - - - - 0  Moving slowly or fidgety/restless - - - - 0  Suicidal thoughts - - - - 0  PHQ-9 Score - - - - 4  Difficult doing work/chores - - - - Not difficult at all     Fall Risk: Fall Risk  06/08/2016 03/09/2016 02/09/2016 11/11/2015 07/29/2015  Falls in the past year? No No Yes No  No  Number falls in past yr: - - 1 - -  Injury with Fall? - - Yes - -      Functional Status Survey: Is the patient deaf or have difficulty hearing?: Yes Does the patient have difficulty seeing, even when wearing glasses/contacts?: No Does the patient have difficulty concentrating, remembering, or making decisions?: No Does the patient have difficulty walking or climbing stairs?: No Does the patient have difficulty dressing or bathing?: No Does the patient have difficulty doing errands alone such as visiting a doctor's office or shopping?: No    Assessment & Plan  1. Chronic recurrent major depressive disorder (HCC)  - amphetamine-dextroamphetamine (ADDERALL) 15 MG tablet; Take 1 tablet by mouth 2 (two) times daily.  Dispense: 60 tablet; Refill: 0 - amphetamine-dextroamphetamine (ADDERALL) 15 MG tablet; Take 1 tablet by mouth 2 (two) times daily.  Dispense: 60 tablet; Refill: 0 - amphetamine-dextroamphetamine (ADDERALL) 15 MG tablet; Take 1 tablet by mouth 2 (two) times daily.  Dispense: 60 tablet; Refill: 0 - ALPRAZolam (XANAX XR) 1 MG 24 hr tablet; Take 1 tablet (1 mg total) by mouth daily.  Dispense: 30 tablet; Refill: 2  2. 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 Dyslipidemia  Lipid panel shows low HDL : to improve HDL patient   needs to eat tree nuts ( pecans/pistachios/almonds ) four times weekly, eat fish two times weekly  and exercise  at least 150 minutes per week -lipid panel    4. Insomnia, persistent  Continue medication   5. Erectile dysfunction, unspecified erectile dysfunction type  Continue medication   6. Genital herpes  - valACYclovir (VALTREX) 500 MG tablet; Take 1 tablet (500 mg total) by mouth 3 (three) times daily.  Dispense: 30 tablet; Refill: 2   7. Long-term use of high-risk medication  - Comprehensive metabolic panel

## 2016-06-13 MED ORDER — ALPRAZOLAM 1 MG PO TABS: 1.0000 mg | ORAL_TABLET | Freq: Two times a day (BID) | ORAL | Status: DC | PRN

## 2016-06-13 NOTE — Addendum Note (Signed)
Addended by: Inda Coke on: 06/13/2016 03:48 PM   Modules accepted: Orders, Medications

## 2016-06-19 ENCOUNTER — Other Ambulatory Visit: Payer: Self-pay | Admitting: Family Medicine

## 2016-07-14 LAB — HM DIABETES EYE EXAM

## 2016-09-09 ENCOUNTER — Ambulatory Visit: Payer: Medicare Other | Admitting: Family Medicine

## 2016-09-15 ENCOUNTER — Other Ambulatory Visit: Payer: Self-pay | Admitting: Family Medicine

## 2016-09-15 LAB — COMPREHENSIVE METABOLIC PANEL
ALBUMIN: 4.2 g/dL (ref 3.6–5.1)
ALT: 19 U/L (ref 9–46)
AST: 21 U/L (ref 10–35)
Alkaline Phosphatase: 97 U/L (ref 40–115)
BILIRUBIN TOTAL: 0.3 mg/dL (ref 0.2–1.2)
BUN: 14 mg/dL (ref 7–25)
CALCIUM: 9.2 mg/dL (ref 8.6–10.3)
CO2: 26 mmol/L (ref 20–31)
Chloride: 102 mmol/L (ref 98–110)
Creat: 0.86 mg/dL (ref 0.70–1.25)
Glucose, Bld: 101 mg/dL — ABNORMAL HIGH (ref 65–99)
Potassium: 4.8 mmol/L (ref 3.5–5.3)
Sodium: 140 mmol/L (ref 135–146)
TOTAL PROTEIN: 6.3 g/dL (ref 6.1–8.1)

## 2016-09-15 LAB — LIPID PANEL
CHOLESTEROL: 159 mg/dL (ref 125–200)
HDL: 39 mg/dL — ABNORMAL LOW (ref >=40)
LDL Cholesterol: 47 mg/dL (ref <130)
Total CHOL/HDL Ratio: 4.1 Ratio (ref <=5.0)
Triglycerides: 367 mg/dL — ABNORMAL HIGH (ref <150)
VLDL: 73 mg/dL — AB (ref <30)

## 2016-09-16 ENCOUNTER — Ambulatory Visit (INDEPENDENT_AMBULATORY_CARE_PROVIDER_SITE_OTHER): Payer: Medicare Other | Admitting: Family Medicine

## 2016-09-16 ENCOUNTER — Encounter: Payer: Self-pay | Admitting: Family Medicine

## 2016-09-16 VITALS — BP 104/64 | HR 87 | Temp 98.3°F | Resp 16 | Ht 69.0 in | Wt 221.0 lb

## 2016-09-16 DIAGNOSIS — Z23 Encounter for immunization: Secondary | ICD-10-CM

## 2016-09-16 DIAGNOSIS — G4700 Insomnia, unspecified: Secondary | ICD-10-CM

## 2016-09-16 DIAGNOSIS — E781 Pure hyperglyceridemia: Secondary | ICD-10-CM

## 2016-09-16 DIAGNOSIS — F411 Generalized anxiety disorder: Secondary | ICD-10-CM | POA: Diagnosis not present

## 2016-09-16 DIAGNOSIS — E785 Hyperlipidemia, unspecified: Secondary | ICD-10-CM

## 2016-09-16 DIAGNOSIS — E786 Lipoprotein deficiency: Secondary | ICD-10-CM

## 2016-09-16 DIAGNOSIS — F339 Major depressive disorder, recurrent, unspecified: Secondary | ICD-10-CM

## 2016-09-16 DIAGNOSIS — E1140 Type 2 diabetes mellitus with diabetic neuropathy, unspecified: Secondary | ICD-10-CM

## 2016-09-16 DIAGNOSIS — E782 Mixed hyperlipidemia: Secondary | ICD-10-CM

## 2016-09-16 DIAGNOSIS — E1169 Type 2 diabetes mellitus with other specified complication: Secondary | ICD-10-CM | POA: Insufficient documentation

## 2016-09-16 DIAGNOSIS — N521 Erectile dysfunction due to diseases classified elsewhere: Secondary | ICD-10-CM | POA: Diagnosis not present

## 2016-09-16 LAB — HEMOGLOBIN A1C
Hgb A1c MFr Bld: 6.2 % — ABNORMAL HIGH (ref <5.7)
MEAN PLASMA GLUCOSE: 131 mg/dL

## 2016-09-16 MED ORDER — AMPHETAMINE-DEXTROAMPHETAMINE 15 MG PO TABS: 15.0000 mg | ORAL_TABLET | Freq: Two times a day (BID) | ORAL | 0 refills | Status: DC

## 2016-09-16 MED ORDER — ALPRAZOLAM 1 MG PO TABS: 0.5000 mg | ORAL_TABLET | Freq: Two times a day (BID) | ORAL | 2 refills | Status: DC | PRN

## 2016-09-16 MED ORDER — AMPHETAMINE-DEXTROAMPHETAMINE 15 MG PO TABS: 1.0000 | ORAL_TABLET | Freq: Two times a day (BID) | ORAL | 0 refills | Status: DC

## 2016-09-16 NOTE — Progress Notes (Signed)
Name: Shane Boyd.   MRN: [id number]    DOB: 09-Mar-1951   Date:09/16/2016       Progress Note  Subjective  Chief Complaint  Chief Complaint  Patient presents with  . Diabetes    pt not checking his BS but once a month 110 avg  . Depression  . Flu Vaccine    HPI  DMII with ED: he is taking Metformin twice daily, denies polyphagia, polydipsia or polyuria. He has ED . He lost 2 lbs since last visit. He has low HDL and high triglycerides - discussed change in diet. He also needs to increase exercise  ED: taking generic Viagra and is doing well, denies side effects of medication.   Major Depressive Disorder: he is doing better now, he states he stopped Celexa because of side effects. Sexual drive has improved, doing better at home. Denies suicidal ideation. They are going to see bands playing  GAD: discussed importance of going down on BZD dose. He still feels anxious, denies recent panic attack.  GERD: he takes medication daily, if he skips medication symptoms are severe , he is aware of possible side effects of medication. He does not want to try weaning self off at this time .  Dyslipidemia: taking statin therapy and denies myalgia. No chest pain. Reviewed labs and discussed adding Lovaza but he wants to try life style modification  Insomnia: taking Alprazolam prn and is working well for him, however discussed changing to Alprazolam XR, since he is taking immediate release twice daily for mild anxiety also.    Patient Active Problem List   Diagnosis Date Noted  . Genital herpes 06/08/2016  . Dizziness 03/09/2016  . Neck pain 07/29/2015  . Anxiety 07/19/2015  . Insomnia, persistent 07/19/2015  . CN (constipation) 07/19/2015  . 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 Dyslipidemia 07/19/2015  . Gastric reflux 07/19/2015  . History of shingles 07/19/2015  . History of basal cell cancer 07/19/2015  . H/O Malignant melanoma  07/19/2015  . Personal history of malignant neoplasm of head and neck 07/19/2015  . Chronic recurrent major depressive disorder ([place]) 07/19/2015  . Obesity (BMI 30.0-34.9) 07/19/2015  . ED (erectile dysfunction) 07/19/2015    Past Surgical History:  Procedure Laterality Date  . NASAL SEPTUM SURGERY      Family History  Problem Relation Age of Onset  . Diabetes Mother   . Heart disease Mother   . Hyperlipidemia Mother   . CVA Father   . Cancer Father     Colon  . Depression Father     Social History   Social History  . Marital status: Married    Spouse name: N/A  . Number of children: N/A  . Years of education: N/A   Occupational History  . retired    Social History Main Topics  . Smoking status: Never Smoker  . Smokeless tobacco: Never Used  . Alcohol use 0.0 oz/week  . Drug use: No  . Sexual activity: Yes   Other Topics Concern  . Not on file   Social History Narrative   One son: [name] lives in [place]   One son Trip , died from possible suicide while living in [place]     Current Outpatient Prescriptions:  .  ALPRAZolam (XANAX) 1 MG tablet, Take 1 tablet (1 mg total) by mouth 2 (two) times daily as needed for anxiety., Disp: 60 tablet, Rfl: 2 .  amphetamine-dextroamphetamine (ADDERALL) 15 MG tablet, Take  1 tablet by mouth 2 (two) times daily., Disp: 60 tablet, Rfl: 0 .  amphetamine-dextroamphetamine (ADDERALL) 15 MG tablet, Take 1 tablet by mouth 2 (two) times daily., Disp: 60 tablet, Rfl: 0 .  amphetamine-dextroamphetamine (ADDERALL) 15 MG tablet, Take 1 tablet by mouth 2 (two) times daily., Disp: 60 tablet, Rfl: 0 .  aspirin EC 81 MG tablet, Take 1 tablet (81 mg total) by mouth daily., Disp: 30 tablet, Rfl: 0 .  atorvastatin (LIPITOR) 80 MG tablet, Take 1 tablet (80 mg total) by mouth daily., Disp: 90 tablet, Rfl: 1 .  buPROPion (WELLBUTRIN XL) 300 MG 24 hr tablet, Take 1 tablet (300 mg total) by mouth daily., Disp: 90 tablet, Rfl: 1 .  docusate sodium  (COLACE) 100 MG capsule, Take 1 capsule (100 mg total) by mouth 2 (two) times daily., Disp: 30 capsule, Rfl: 0 .  metFORMIN (GLUCOPHAGE) 500 MG tablet, Take 1 tablet (500 mg total) by mouth 2 (two) times daily., Disp: 180 tablet, Rfl: 1 .  omeprazole (PRILOSEC) 40 MG capsule, Take 1 capsule (40 mg total) by mouth daily., Disp: 90 capsule, Rfl: 1 .  sildenafil (REVATIO) 20 MG tablet, Take 1 tablet (20 mg total) by mouth as needed., Disp: 20 tablet, Rfl: 5 .  tiZANidine (ZANAFLEX) 4 MG capsule, TAKE ONE CAPSULE BY MOUTH THREE TIMES DAILY, Disp: 90 capsule, Rfl: 2 .  valACYclovir (VALTREX) 500 MG tablet, Take 1 tablet (500 mg total) by mouth 3 (three) times daily., Disp: 30 tablet, Rfl: 2  No Known Allergies   ROS  Constitutional: Negative for fever or weight change.  Respiratory: Negative for cough and shortness of breath.   Cardiovascular: Negative for chest pain or palpitations.  Gastrointestinal: Negative for abdominal pain, no bowel changes.  Musculoskeletal: Negative for gait problem or joint swelling.  Skin: Negative for rash.  Neurological: Negative for dizziness or headache.  No other specific complaints in a complete review of systems (except as listed in HPI above).  Objective  Vitals:   09/16/16 1203  BP: 104/64  Pulse: 87  Resp: 16  Temp: 98.3 F (36.8 C)  SpO2: 97%  Weight: 221 lb (100.2 kg)  Height: 5\' 9"  (1.753 m)    Body mass index is 32.64 kg/m.  Physical Exam  Constitutional: Patient appears well-developed and well-nourished. Obese No distress.  HEENT: head atraumatic, normocephalic, pupils equal and reactive to light,  neck supple, throat within normal limits Cardiovascular: Normal rate, regular rhythm and normal heart sounds.  No murmur heard. No BLE edema. Pulmonary/Chest: Effort normal and breath sounds normal. No respiratory distress. Abdominal: Soft.  There is no tenderness. Psychiatric: Patient has a normal mood and affect. behavior is normal.  Judgment and thought content normal.   Recent Results (from the past 2160 hour(s))  HM DIABETES EYE EXAM     Status: None   Collection Time: 07/14/16 12:00 AM  Result Value Ref Range   HM Diabetic Eye Exam No Retinopathy No Retinopathy    Comment: [employer]  Comprehensive metabolic panel     Status: Abnormal   Collection Time: 09/15/16  9:06 AM  Result Value Ref Range   Sodium 140 135 - 146 mmol/L   Potassium 4.8 3.5 - 5.3 mmol/L   Chloride 102 98 - 110 mmol/L   CO2 26 20 - 31 mmol/L   Glucose, Bld 101 (H) 65 - 99 mg/dL   BUN 14 7 - 25 mg/dL   Creat 0.86 0.70 - 1.25 mg/dL    Comment:  For patients > or = 67 years of age: The upper reference limit for Creatinine is approximately 13% higher for people identified as African-American.      Total Bilirubin 0.3 0.2 - 1.2 mg/dL   Alkaline Phosphatase 97 40 - 115 U/L   AST 21 10 - 35 U/L   ALT 19 9 - 46 U/L   Total Protein 6.3 6.1 - 8.1 g/dL   Albumin 4.2 3.6 - 5.1 g/dL   Calcium 9.2 8.6 - 10.3 mg/dL  Lipid panel     Status: Abnormal   Collection Time: 09/15/16  9:06 AM  Result Value Ref Range   Cholesterol 159 125 - 200 mg/dL   Triglycerides 367 (H) <150 mg/dL   HDL 39 (L) >=40 mg/dL   Total CHOL/HDL Ratio 4.1 <=5.0 Ratio   VLDL 73 (H) <30 mg/dL   LDL Cholesterol 47 <130 mg/dL    Comment:   Total Cholesterol/HDL Ratio:CHD Risk                        Coronary Heart Disease Risk Table                                        Men       Women          1/2 Average Risk              3.4        3.3              Average Risk              5.0        4.4           2X Average Risk              9.6        7.1           3X Average Risk             23.4       11.0 Use the calculated Patient Ratio above and the CHD Risk table  to determine the patient's CHD Risk.   Hemoglobin A1c     Status: Abnormal   Collection Time: 09/15/16  9:06 AM  Result Value Ref Range   Hgb A1c MFr Bld 6.2 (H) <5.7 %    Comment:   For someone  without known diabetes, a hemoglobin A1c value between 5.7% and 6.4% is consistent with prediabetes and should be confirmed with a follow-up test.   For someone with known diabetes, a value <7% indicates that their diabetes is well controlled. A1c targets should be individualized based on duration of diabetes, age, co-morbid conditions and other considerations.   This assay result is consistent with an increased risk of diabetes.   Currently, no consensus exists regarding use of hemoglobin A1c for diagnosis of diabetes in children.      Mean Plasma Glucose 131 mg/dL    Diabetic Foot Exam: Diabetic Foot Exam - Simple   Simple Foot Form Diabetic Foot exam was performed with the following findings:  Yes 09/16/2016 12:54 PM  Visual Inspection No deformities, no ulcerations, no other skin breakdown bilaterally:  Yes Sensation Testing Intact to touch and monofilament testing bilaterally:  Yes Pulse Check Posterior Tibialis and Dorsalis pulse intact bilaterally:  Yes Comments  PHQ2/9: Depression screen [facility] 2/9 09/16/2016 06/08/2016 03/09/2016 02/09/2016 11/11/2015  Decreased Interest 0 0 0 0 0  Down, Depressed, Hopeless 0 0 0 0 1  PHQ - 2 Score 0 0 0 0 1  Altered sleeping - - - - -  Tired, decreased energy - - - - -  Change in appetite - - - - -  Feeling bad or failure about yourself  - - - - -  Trouble concentrating - - - - -  Moving slowly or fidgety/restless - - - - -  Suicidal thoughts - - - - -  PHQ-9 Score - - - - -  Difficult doing work/chores - - - - -    GAD 7 : Generalized Anxiety Score 09/16/2016 06/08/2016  Nervous, Anxious, on Edge 1 1  Control/stop worrying 1 0  Worry too much - different things 1 0  Trouble relaxing 1 0  Restless 0 0  Easily annoyed or irritable 1 1  Afraid - awful might happen 0 0  Total GAD 7 Score 5 2  Anxiety Difficulty Somewhat difficult Not difficult at all     Fall Risk: Fall Risk  09/16/2016 06/08/2016 03/09/2016 02/09/2016  11/11/2015  Falls in the past year? No No No Yes No  Number falls in past yr: - - - 1 -  Injury with Fall? - - - Yes -     Functional Status Survey: Is the patient deaf or have difficulty hearing?: Yes Does the patient have difficulty seeing, even when wearing glasses/contacts?: No Does the patient have difficulty concentrating, remembering, or making decisions?: No Does the patient have difficulty walking or climbing stairs?: Yes Does the patient have difficulty dressing or bathing?: No Does the patient have difficulty doing errands alone such as visiting a doctor's office or shopping?: No    Assessment & Plan  1. 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 Need for influenza vaccination  - Flu vaccine HIGH DOSE PF (Fluzone High dose)  3. Dyslipidemia  Triglycerides is elevated, discussed resuming diet, and consider Lovaza if it remains elevated.   4. Insomnia, persistent  Continue Alprazolam qhs  5. Chronic recurrent major depressive disorder (HCC)  On Wellbutrin and seems to be doing well, he feels kind of purperless at times  - amphetamine-dextroamphetamine (ADDERALL) 15 MG tablet; Take 1 tablet by mouth 2 (two) times daily.  Dispense: 60 tablet; Refill: 0 - amphetamine-dextroamphetamine (ADDERALL) 15 MG tablet; Take 1 tablet by mouth 2 (two) times daily.  Dispense: 60 tablet; Refill: 0 - amphetamine-dextroamphetamine (ADDERALL) 15 MG tablet; Take 1 tablet by mouth 2 (two) times daily.  Dispense: 60 tablet; Refill: 0  6. GAD (generalized anxiety disorder)  - ALPRAZolam (XANAX) 1 MG tablet; Take 0.5-1 tablets (0.5-1 mg total) by mouth 2 (two) times daily as needed for anxiety.  Dispense: 45 tablet; Refill: 2 He is doing better, he has not been taking Alprazolam every morning, discussed weaning down dose, and we will go down to 45 pill per month instead of 60   7. Dyslipidemia with low high density  lipoprotein (HDL) cholesterol 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)  Continue medications

## 2016-10-05 ENCOUNTER — Other Ambulatory Visit: Payer: Self-pay | Admitting: Family Medicine

## 2016-10-11 ENCOUNTER — Encounter: Payer: Self-pay | Admitting: Family Medicine

## 2016-10-11 ENCOUNTER — Ambulatory Visit (INDEPENDENT_AMBULATORY_CARE_PROVIDER_SITE_OTHER): Payer: Medicare Other | Admitting: Family Medicine

## 2016-10-11 VITALS — BP 122/70 | HR 87 | Temp 97.7°F | Ht 69.0 in | Wt 216.8 lb

## 2016-10-11 DIAGNOSIS — M79671 Pain in right foot: Secondary | ICD-10-CM

## 2016-10-11 DIAGNOSIS — R94120 Abnormal auditory function study: Secondary | ICD-10-CM | POA: Diagnosis not present

## 2016-10-11 DIAGNOSIS — Z1211 Encounter for screening for malignant neoplasm of colon: Secondary | ICD-10-CM

## 2016-10-11 DIAGNOSIS — Z0000 Encounter for general adult medical examination without abnormal findings: Secondary | ICD-10-CM

## 2016-10-11 MED ORDER — MELOXICAM 15 MG PO TABS: 15.0000 mg | ORAL_TABLET | Freq: Every day | ORAL | 0 refills | Status: DC

## 2016-10-11 NOTE — Progress Notes (Signed)
Name: Shane Boyd.   MRN: [id number]    DOB: 11-Oct-1951   Date:10/11/2016       Progress Note  Subjective  Chief Complaint  Chief Complaint  Patient presents with  . Annual Exam    medicare wellness    HPI  Functional ability/safety issues: No Issues Hearing issues: wife complains that he can' hear well - he has difficulty hearing with right ear Activities of daily living: Discussed Home safety issues: No Issues  End Of Life Planning: Offered verbal information regarding advanced directives, healthcare power of attorney. - in our charts  Preventative care, Health maintenance, Preventative health measures discussed.  Preventative screenings discussed today: lab work, colonoscopy - we will schedule it, PSA - we will not check it.  Men age 31 to 64 years if ever smoked recommended to get a one time AAA ultrasound screening exam.  Low Dose CT Chest recommended if Age 47-80 years, 30 pack-year currently smoking OR have quit w/in 15years.   Lifestyle risk factor issued reviewed: Diet, exercise, weight management, advised patient smoking is not healthy, nutrition/diet.  Preventative health measures discussed (5-10 year plan).  Reviewed and recommended vaccinations: - Pneumovax  - Prevnar  - Annual Influenza - Zostavax - Tdap   Depression screening: Done - treated for it.  Fall risk screening: Done Discuss ADLs/IADLs: Done  Current medical providers: See HPI  Other health risk factors identified this visit: No other issues Cognitive impairment issues: None identified  All above discussed with patient. Appropriate education, counseling and referral will be made based upon the above.   Right foot pain: he developed pain on forefoot and 2nd and 3 rd toes since he went to a concert and walked for a long time about 1 month ago. Pain is intermittent, aggravated by applying pressure. Described as sharp  IPSS Questionnaire (AUA-7): Over the past month.   1)  How often  have you had a sensation of not emptying your bladder completely after you finish urinating?  0 - Not at all  2)  How often have you had to urinate again less than two hours after you finished urinating? 1 - Less than 1 time in 5  3)  How often have you found you stopped and started again several times when you urinated?  0 - Not at all  4) How difficult have you found it to postpone urination?  0 - Not at all  5) How often have you had a weak urinary stream?  5 - Almost always  6) How often have you had to push or strain to begin urination?  0 - Not at all  7) How many times did you most typically get up to urinate from the time you went to bed until the time you got up in the morning?  1 - 1 time  Total score:  0-7 mildly symptomatic   8-19 moderately symptomatic   20-35 severely symptomatic    Patient Active Problem List   Diagnosis Date Noted  . Dyslipidemia with low high density lipoprotein (HDL) cholesterol with hypertriglyceridemia due to type 2 diabetes mellitus ([place]) 09/16/2016  . GAD (generalized anxiety disorder) 09/16/2016  . Genital herpes 06/08/2016  . Dizziness 03/09/2016  . Neck pain 07/29/2015  . Anxiety 07/19/2015  . Insomnia, persistent 07/19/2015  . CN (constipation) 07/19/2015  . 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 Dyslipidemia 07/19/2015  . Gastric reflux 07/19/2015  . History of shingles 07/19/2015  . History  of basal cell cancer 07/19/2015  . H/O Malignant melanoma 07/19/2015  . Personal history of malignant neoplasm of head and neck 07/19/2015  . Chronic recurrent major depressive disorder ([place]) 07/19/2015  . Obesity (BMI 30.0-34.9) 07/19/2015  . ED (erectile dysfunction) 07/19/2015    Past Surgical History:  Procedure Laterality Date  . NASAL SEPTUM SURGERY      Family History  Problem Relation Age of Onset  . Diabetes Mother   . Heart disease Mother   . Hyperlipidemia Mother   . CVA Father   .  Cancer Father     Colon  . Depression Father     Social History   Social History  . Marital status: Married    Spouse name: N/A  . Number of children: N/A  . Years of education: N/A   Occupational History  . retired    Social History Main Topics  . Smoking status: Never Smoker  . Smokeless tobacco: Never Used  . Alcohol use 0.0 oz/week  . Drug use: No  . Sexual activity: Yes   Other Topics Concern  . Not on file   Social History Narrative   One son: [name] lives in [place]   One son Trip , died from possible suicide while living in [place]   125Hz  250Hz  500Hz  1000Hz  2000Hz  3000Hz  4000Hz  6000Hz  8000Hz   Right ear:   Fail Fail Fail  Fail    Left ear:   Pass Pass Fail  Fail      Current Outpatient Prescriptions:  .  ALPRAZolam (XANAX) 1 MG tablet, Take 0.5-1 tablets (0.5-1 mg total) by mouth 2 (two) times daily as needed for anxiety., Disp: 45 tablet, Rfl: 2 .  amphetamine-dextroamphetamine (ADDERALL) 15 MG tablet, Take 1 tablet by mouth 2 (two) times daily., Disp: 60 tablet, Rfl: 0 .  amphetamine-dextroamphetamine (ADDERALL) 15 MG tablet, Take 1 tablet by mouth 2 (two) times daily., Disp: 60 tablet, Rfl: 0 .  amphetamine-dextroamphetamine (ADDERALL) 15 MG tablet, Take 1 tablet by mouth 2 (two) times daily., Disp: 60 tablet, Rfl: 0 .  aspirin EC 81 MG tablet, Take 1 tablet (81 mg total) by mouth daily., Disp: 30 tablet, Rfl: 0 .  atorvastatin (LIPITOR) 80 MG tablet, Take 1 tablet (80 mg total) by mouth daily., Disp: 90 tablet, Rfl: 1 .  buPROPion (WELLBUTRIN XL) 300 MG 24 hr tablet, Take 1 tablet (300 mg total) by mouth daily., Disp: 90 tablet, Rfl: 1 .  docusate sodium (COLACE) 100 MG capsule, Take 1 capsule (100 mg total) by mouth 2 (two) times daily., Disp: 30 capsule, Rfl: 0 .  metFORMIN (GLUCOPHAGE) 500 MG tablet, Take 1 tablet (500 mg total) by mouth 2 (two) times daily., Disp: 180 tablet, Rfl: 1 .  omeprazole (PRILOSEC) 40 MG capsule, Take 1 capsule  (40 mg total) by mouth daily., Disp: 90 capsule, Rfl: 1 .  sildenafil (REVATIO) 20 MG tablet, Take 1 tablet (20 mg total) by mouth as needed., Disp: 20 tablet, Rfl: 5 .  tiZANidine (ZANAFLEX) 4 MG capsule, TAKE ONE CAPSULE BY MOUTH THREE TIMES DAILY, Disp: 90 capsule, Rfl: 2 .  valACYclovir (VALTREX) 500 MG tablet, Take 1 tablet (500 mg total) by mouth 3 (three) times daily., Disp: 30 tablet, Rfl: 2 .  meloxicam (MOBIC) 15 MG tablet, Take 1 tablet (15 mg total) by mouth daily., Disp: 30 tablet, Rfl: 0  No Known Allergies   ROS  Constitutional: Negative for fever or weight change.  Respiratory: Negative for  cough and shortness of breath.   Cardiovascular: Negative for chest pain or palpitations.  Gastrointestinal: Negative for abdominal pain, no bowel changes.  Musculoskeletal: Negative for gait problem or joint swelling.  Skin: Negative for rash.  Neurological: Negative for dizziness or headache.  No other specific complaints in a complete review of systems (except as listed in HPI above).  Objective  Vitals:   10/11/16 0817  BP: 122/70  Pulse: 87  Temp: 97.7 F (36.5 C)  SpO2: 98%  Weight: 216 lb 12.8 oz (98.3 kg)  Height: 5\' 9"  (1.753 m)    Body mass index is 32.02 kg/m.  Physical Exam  Constitutional: Patient appears well-developed and obese No distress.  HENT: Head: Normocephalic and atraumatic. Ears: B TMs ok, no erythema or effusion; Nose: Nose normal. Mouth/Throat: Oropharynx is clear and moist. No oropharyngeal exudate.  Eyes: Conjunctivae and EOM are normal. Pupils are equal, round, and reactive to light. No scleral icterus.  Neck: Normal range of motion. Neck supple. No JVD present. No thyromegaly present.  Cardiovascular: Normal rate, regular rhythm and normal heart sounds.  No murmur heard. No BLE edema. Pulmonary/Chest: Effort normal and breath sounds normal. No respiratory distress. Abdominal: Soft. Bowel sounds are normal, no distension. There is no  tenderness. no masses MALE GENITALIA: not done RECTAL: not done Musculoskeletal: Normal range of motion, no joint effusions. No gross deformities, pain during palpation of right 2nd and 3rd toes and methartasal foot pain , no increase in warmth or swelling Neurological: he is alert and oriented to person, place, and time. No cranial nerve deficit. Coordination, balance, strength, speech and gait are normal.  Skin: Skin is warm and dry. No rash noted. No erythema.  Psychiatric: Patient has a normal mood and affect. behavior is normal. Judgment and thought content normal.  Recent Results (from the past 2160 hour(s))  HM DIABETES EYE EXAM     Status: None   Collection Time: 07/14/16 12:00 AM  Result Value Ref Range   HM Diabetic Eye Exam No Retinopathy No Retinopathy    Comment: [employer]  Comprehensive metabolic panel     Status: Abnormal   Collection Time: 09/15/16  9:06 AM  Result Value Ref Range   Sodium 140 135 - 146 mmol/L   Potassium 4.8 3.5 - 5.3 mmol/L   Chloride 102 98 - 110 mmol/L   CO2 26 20 - 31 mmol/L   Glucose, Bld 101 (H) 65 - 99 mg/dL   BUN 14 7 - 25 mg/dL   Creat 0.86 0.70 - 1.25 mg/dL    Comment:   For patients > or = 67 years of age: The upper reference limit for Creatinine is approximately 13% higher for people identified as African-American.      Total Bilirubin 0.3 0.2 - 1.2 mg/dL   Alkaline Phosphatase 97 40 - 115 U/L   AST 21 10 - 35 U/L   ALT 19 9 - 46 U/L   Total Protein 6.3 6.1 - 8.1 g/dL   Albumin 4.2 3.6 - 5.1 g/dL   Calcium 9.2 8.6 - 10.3 mg/dL  Lipid panel     Status: Abnormal   Collection Time: 09/15/16  9:06 AM  Result Value Ref Range   Cholesterol 159 125 - 200 mg/dL   Triglycerides 367 (H) <150 mg/dL   HDL 39 (L) >=40 mg/dL   Total CHOL/HDL Ratio 4.1 <=5.0 Ratio   VLDL 73 (H) <30 mg/dL   LDL Cholesterol 47 <130 mg/dL    Comment:  Total Cholesterol/HDL Ratio:CHD Risk                        Coronary Heart Disease Risk  Table                                        Men       Women          1/2 Average Risk              3.4        3.3              Average Risk              5.0        4.4           2X Average Risk              9.6        7.1           3X Average Risk             23.4       11.0 Use the calculated Patient Ratio above and the CHD Risk table  to determine the patient's CHD Risk.   Hemoglobin A1c     Status: Abnormal   Collection Time: 09/15/16  9:06 AM  Result Value Ref Range   Hgb A1c MFr Bld 6.2 (H) <5.7 %    Comment:   For someone without known diabetes, a hemoglobin A1c value between 5.7% and 6.4% is consistent with prediabetes and should be confirmed with a follow-up test.   For someone with known diabetes, a value <7% indicates that their diabetes is well controlled. A1c targets should be individualized based on duration of diabetes, age, co-morbid conditions and other considerations.   This assay result is consistent with an increased risk of diabetes.   Currently, no consensus exists regarding use of hemoglobin A1c for diagnosis of diabetes in children.      Mean Plasma Glucose 131 mg/dL      PHQ2/9: Depression screen [facility] 2/9 10/11/2016 09/16/2016 06/08/2016 03/09/2016 02/09/2016  Decreased Interest 0 0 0 0 0  Down, Depressed, Hopeless 0 0 0 0 0  PHQ - 2 Score 0 0 0 0 0  Altered sleeping - - - - -  Tired, decreased energy - - - - -  Change in appetite - - - - -  Feeling bad or failure about yourself  - - - - -  Trouble concentrating - - - - -  Moving slowly or fidgety/restless - - - - -  Suicidal thoughts - - - - -  PHQ-9 Score - - - - -  Difficult doing work/chores - - - - -    Fall Risk: Fall Risk  10/11/2016 09/16/2016 06/08/2016 03/09/2016 02/09/2016  Falls in the past year? No No No No Yes  Number falls in past yr: - - - - 1  Injury with Fall? - - - - Yes     Functional Status Survey: Is the patient deaf or have difficulty hearing?: No Does the patient have  difficulty seeing, even when wearing glasses/contacts?: Yes (glasses) Does the patient have difficulty concentrating, remembering, or making decisions?: No Does the patient have difficulty walking or climbing stairs?: No Does the patient have difficulty dressing or bathing?:  No Does the patient have difficulty doing errands alone such as visiting a doctor's office or shopping?: No  .ks  Assessment & Plan  1. Medicare annual wellness visit, subsequent  Discussed importance of 150 minutes of physical activity weekly, eat two servings of fish weekly, eat one serving of tree nuts ( cashews, pistachios, pecans, almonds.Marland Kitchen) every other day, eat 6 servings of fruit/vegetables daily and drink plenty of water and avoid sweet beverages.   2. Colon cancer screening  - Ambulatory referral to Gastroenterology  3. Failed hearing screening  - Ambulatory referral to ENT  4. Foot pain, right  - meloxicam (MOBIC) 15 MG tablet; Take 1 tablet (15 mg total) by mouth daily.  Dispense: 30 tablet; Refill: 0 Advised patient to wear tennis shoes, avoid pressure on the area, and call back for referral to Podiatrist if no improvement

## 2016-10-21 ENCOUNTER — Encounter: Payer: Self-pay | Admitting: Family Medicine

## 2016-10-21 DIAGNOSIS — H905 Unspecified sensorineural hearing loss: Secondary | ICD-10-CM | POA: Insufficient documentation

## 2016-10-24 ENCOUNTER — Telehealth: Payer: Self-pay | Admitting: Gastroenterology

## 2016-10-24 NOTE — Telephone Encounter (Signed)
Patient would like to schedule his colonoscopy after Jan 8

## 2016-11-09 ENCOUNTER — Other Ambulatory Visit: Payer: Self-pay

## 2016-11-09 ENCOUNTER — Telehealth: Payer: Self-pay

## 2016-11-09 NOTE — Telephone Encounter (Signed)
Gastroenterology Pre-Procedure Review  Request Date: 12/16/15 Requesting Physician:   PATIENT REVIEW QUESTIONS: The patient responded to the following health history questions as indicated:    1. Are you having any GI issues? no 2. Do you have a personal history of Polyps? no 3. Do you have a family history of Colon Cancer or Polyps? yes (father colon cancer) 4. Diabetes Mellitus? no 5. Joint replacements in the past 12 months?no 6. Major health problems in the past 3 months?no 7. Any artificial heart valves, MVP, or defibrillator?no    MEDICATIONS & ALLERGIES:    Patient reports the following regarding taking any anticoagulation/antiplatelet therapy:   Plavix, Coumadin, Eliquis, Xarelto, Lovenox, Pradaxa, Brilinta, or Effient? no Aspirin? yes ([facility])  Patient confirms/reports the following medications:  Current Outpatient Prescriptions  Medication Sig Dispense Refill  . ALPRAZolam (XANAX) 1 MG tablet Take 0.5-1 tablets (0.5-1 mg total) by mouth 2 (two) times daily as needed for anxiety. 45 tablet 2  . amphetamine-dextroamphetamine (ADDERALL) 15 MG tablet Take 1 tablet by mouth 2 (two) times daily. 60 tablet 0  . aspirin EC 81 MG tablet Take 1 tablet (81 mg total) by mouth daily. 30 tablet 0  . atorvastatin (LIPITOR) 80 MG tablet Take 1 tablet (80 mg total) by mouth daily. 90 tablet 1  . buPROPion (WELLBUTRIN XL) 300 MG 24 hr tablet Take 1 tablet (300 mg total) by mouth daily. 90 tablet 1  . docusate sodium (COLACE) 100 MG capsule Take 1 capsule (100 mg total) by mouth 2 (two) times daily. 30 capsule 0  . metFORMIN (GLUCOPHAGE) 500 MG tablet Take 1 tablet (500 mg total) by mouth 2 (two) times daily. 180 tablet 1  . omeprazole (PRILOSEC) 40 MG capsule Take 1 capsule (40 mg total) by mouth daily. 90 capsule 1  . tiZANidine (ZANAFLEX) 4 MG capsule TAKE ONE CAPSULE BY MOUTH THREE TIMES DAILY 90 capsule 2  . valACYclovir (VALTREX) 500 MG tablet Take 1 tablet (500 mg total) by mouth  3 (three) times daily. 30 tablet 2   No current facility-administered medications for this visit.     Patient confirms/reports the following allergies:  No Known Allergies  No orders of the defined types were placed in this encounter.   AUTHORIZATION INFORMATION Primary Insurance: 1D#: Group #:  Secondary Insurance: 1D#: Group #:  SCHEDULE INFORMATION: Date: 12/16/15  Time: Location: [facility]

## 2016-11-09 NOTE — Telephone Encounter (Signed)
Scheduled for 12/15/2016 [facility] Dr. Allen Norris

## 2016-11-09 NOTE — Telephone Encounter (Signed)
Screening Colonoscopy Z12.11 [facility] 12/15/2016 Dr. Allen Norris [facility] Pre cert

## 2016-11-21 ENCOUNTER — Other Ambulatory Visit: Payer: Self-pay | Admitting: Family Medicine

## 2016-11-21 DIAGNOSIS — E785 Hyperlipidemia, unspecified: Secondary | ICD-10-CM

## 2016-11-21 NOTE — Telephone Encounter (Signed)
Patient requesting refill of Atorvastatin to [facility].

## 2016-12-06 ENCOUNTER — Telehealth: Payer: Self-pay

## 2016-12-06 NOTE — Telephone Encounter (Signed)
The notification/prior authorization case information was transmitted on 12/06/2016 at 9:59 AM CST. The notification/prior authorization reference number is [id number]

## 2016-12-07 ENCOUNTER — Encounter: Payer: Self-pay | Admitting: *Deleted

## 2016-12-14 ENCOUNTER — Other Ambulatory Visit: Payer: Self-pay | Admitting: Family Medicine

## 2016-12-14 DIAGNOSIS — K219 Gastro-esophageal reflux disease without esophagitis: Secondary | ICD-10-CM

## 2016-12-14 NOTE — Telephone Encounter (Signed)
Patient requesting refill of Omeprazole to [facility].

## 2016-12-14 NOTE — Discharge Instructions (Signed)

## 2016-12-15 ENCOUNTER — Ambulatory Visit: Payer: Medicare Other | Admitting: Anesthesiology

## 2016-12-15 ENCOUNTER — Ambulatory Visit
Admission: RE | Admit: 2016-12-15 | Discharge: 2016-12-15 | Disposition: A | Discharge status: Home / Self Care | Payer: Medicare Other | Source: Ambulatory Visit | Attending: Gastroenterology | Admitting: Gastroenterology

## 2016-12-15 ENCOUNTER — Encounter
Admission: RE | Disposition: A | Discharge status: Home / Self Care | Payer: Self-pay | Source: Ambulatory Visit | Attending: Gastroenterology

## 2016-12-15 DIAGNOSIS — Z1211 Encounter for screening for malignant neoplasm of colon: Secondary | ICD-10-CM | POA: Diagnosis not present

## 2016-12-15 DIAGNOSIS — D122 Benign neoplasm of ascending colon: Secondary | ICD-10-CM

## 2016-12-15 DIAGNOSIS — K219 Gastro-esophageal reflux disease without esophagitis: Secondary | ICD-10-CM | POA: Diagnosis not present

## 2016-12-15 DIAGNOSIS — F329 Major depressive disorder, single episode, unspecified: Secondary | ICD-10-CM | POA: Insufficient documentation

## 2016-12-15 DIAGNOSIS — D123 Benign neoplasm of transverse colon: Secondary | ICD-10-CM

## 2016-12-15 DIAGNOSIS — Z7984 Long term (current) use of oral hypoglycemic drugs: Secondary | ICD-10-CM | POA: Diagnosis not present

## 2016-12-15 DIAGNOSIS — E119 Type 2 diabetes mellitus without complications: Secondary | ICD-10-CM | POA: Insufficient documentation

## 2016-12-15 DIAGNOSIS — Z7982 Long term (current) use of aspirin: Secondary | ICD-10-CM | POA: Diagnosis not present

## 2016-12-15 DIAGNOSIS — Z85820 Personal history of malignant melanoma of skin: Secondary | ICD-10-CM | POA: Insufficient documentation

## 2016-12-15 DIAGNOSIS — Z79899 Other long term (current) drug therapy: Secondary | ICD-10-CM | POA: Diagnosis not present

## 2016-12-15 DIAGNOSIS — E785 Hyperlipidemia, unspecified: Secondary | ICD-10-CM | POA: Insufficient documentation

## 2016-12-15 DIAGNOSIS — F419 Anxiety disorder, unspecified: Secondary | ICD-10-CM | POA: Insufficient documentation

## 2016-12-15 DIAGNOSIS — Z6830 Body mass index (BMI) 30.0-30.9, adult: Secondary | ICD-10-CM | POA: Diagnosis not present

## 2016-12-15 DIAGNOSIS — E669 Obesity, unspecified: Secondary | ICD-10-CM | POA: Diagnosis not present

## 2016-12-15 DIAGNOSIS — K648 Other hemorrhoids: Secondary | ICD-10-CM | POA: Insufficient documentation

## 2016-12-15 HISTORY — DX: Dizziness and giddiness: R42

## 2016-12-15 HISTORY — PX: COLONOSCOPY WITH PROPOFOL: SHX5780

## 2016-12-15 HISTORY — PX: POLYPECTOMY: SHX5525

## 2016-12-15 LAB — GLUCOSE, CAPILLARY
GLUCOSE-CAPILLARY: 95 mg/dL (ref 65–99)
Glucose-Capillary: 101 mg/dL — ABNORMAL HIGH (ref 65–99)

## 2016-12-15 SURGERY — COLONOSCOPY WITH PROPOFOL
Anesthesia: Monitor Anesthesia Care | Wound class: Contaminated

## 2016-12-15 MED ORDER — LACTATED RINGERS IV SOLN
INTRAVENOUS | Status: DC
  Administered 2016-12-15: 07:00:00 via INTRAVENOUS

## 2016-12-15 MED ORDER — PROPOFOL 10 MG/ML IV BOLUS
INTRAVENOUS | Status: DC | PRN
  Administered 2016-12-15 (×3): 50 mg via INTRAVENOUS
  Administered 2016-12-15: 100 mg via INTRAVENOUS
  Administered 2016-12-15: 50 mg via INTRAVENOUS

## 2016-12-15 MED ORDER — STERILE WATER FOR IRRIGATION IR SOLN
Status: DC | PRN
  Administered 2016-12-15: 08:00:00

## 2016-12-15 MED ORDER — LIDOCAINE HCL (CARDIAC) 20 MG/ML IV SOLN
INTRAVENOUS | Status: DC | PRN
  Administered 2016-12-15: 40 mg via INTRAVENOUS

## 2016-12-15 SURGICAL SUPPLY — 23 items

## 2016-12-15 NOTE — Anesthesia Procedure Notes (Signed)
Procedure Name: MAC Date/Time: 12/15/2016 7:59 AM Performed by: Janna Arch Pre-anesthesia Checklist: Patient identified, Emergency Drugs available, Suction available and Patient being monitored Patient Re-evaluated:Patient Re-evaluated prior to inductionOxygen Delivery Method: Nasal cannula

## 2016-12-15 NOTE — Anesthesia Preprocedure Evaluation (Signed)
Anesthesia Evaluation  Patient identified by MRN, date of birth, ID band Patient awake    Reviewed: Allergy & Precautions, NPO status , Patient's Chart, lab work & pertinent test results  Airway Mallampati: II  TM Distance: >3 FB Neck ROM: Full    Dental no notable dental hx.    Pulmonary    Pulmonary exam normal        Cardiovascular Normal cardiovascular exam     Neuro/Psych PSYCHIATRIC DISORDERS Anxiety Depression    GI/Hepatic Neg liver ROS, GERD  Medicated and Controlled,  Endo/Other  diabetes, Well Controlled, Type 2  Renal/GU negative Renal ROS     Musculoskeletal negative musculoskeletal ROS (+)   Abdominal   Peds  Hematology negative hematology ROS (+)   Anesthesia Other Findings   Reproductive/Obstetrics negative OB ROS                             Anesthesia Physical Anesthesia Plan  ASA: II  Anesthesia Plan: MAC   Post-op Pain Management:    Induction: Intravenous  Airway Management Planned:   Additional Equipment:   Intra-op Plan:   Post-operative Plan:   Informed Consent: I have reviewed the patients History and Physical, chart, labs and discussed the procedure including the risks, benefits and alternatives for the proposed anesthesia with the patient or authorized representative who has indicated his/her understanding and acceptance.     Plan Discussed with: CRNA  Anesthesia Plan Comments:         Anesthesia Quick Evaluation

## 2016-12-15 NOTE — Op Note (Signed)
[facility] Gastroenterology Patient Name: Shane Boyd Procedure Date: 12/15/2016 7:50 AM MRN: [id number] Account #: [id number] Date of Birth: 11-20-1951 Admit Type: Outpatient Age: 66 Room: [facility] OR ROOM 01 Gender: Male Note Status: Finalized Procedure:            Colonoscopy Indications:          Screening for colorectal malignant neoplasm Providers:            Lucilla Lame MD, MD Referring MD:         Bethena Roys. Sowles, MD (Referring MD) Medicines:            Propofol per Anesthesia Complications:        No immediate complications. Procedure:            Pre-Anesthesia Assessment:                       - Prior to the procedure, a History and Physical was                        performed, and patient medications and allergies were                        reviewed. The patient's tolerance of previous                        anesthesia was also reviewed. The risks and benefits of                        the procedure and the sedation options and risks were                        discussed with the patient. All questions were                        answered, and informed consent was obtained. Prior                        Anticoagulants: The patient has taken no previous                        anticoagulant or antiplatelet agents. ASA Grade                        Assessment: II - A patient with mild systemic disease.                        After reviewing the risks and benefits, the patient was                        deemed in satisfactory condition to undergo the                        procedure.                       - Prior to the procedure, a History and Physical was                        performed, and patient medications and allergies were  reviewed. The patient's tolerance of previous                        anesthesia was also reviewed. The risks and benefits of                        the procedure and the sedation options and risks were                        discussed with the patient. All questions were                        answered, and informed consent was obtained. Prior                        Anticoagulants: The patient has taken no previous                        anticoagulant or antiplatelet agents. ASA Grade                        Assessment: II - A patient with mild systemic disease.                        After reviewing the risks and benefits, the patient was                        deemed in satisfactory condition to undergo the                        procedure.                       After obtaining informed consent, the colonoscope was                        passed under direct vision. Throughout the procedure,                        the patient's blood pressure, pulse, and oxygen                        saturations were monitored continuously. The was                        introduced through the anus and advanced to the the                        cecum, identified by appendiceal orifice and ileocecal                        valve. The colonoscopy was performed without                        difficulty. The patient tolerated the procedure well.                        The quality of the bowel preparation was excellent. Findings:      The perianal and digital rectal examinations were normal.      A 4 mm polyp  was found in the ascending colon. The polyp was sessile.       The polyp was removed with a cold snare. Resection and retrieval were       complete.      Two sessile polyps were found in the transverse colon. The polyps were 3       to 4 mm in size. These polyps were removed with a cold snare. Resection       and retrieval were complete.      Many small-mouthed diverticula were found in the sigmoid colon.      Non-bleeding internal hemorrhoids were found during retroflexion. The       hemorrhoids were Grade I (internal hemorrhoids that do not prolapse). Impression:           - One 4 mm polyp in the  ascending colon, removed with a                        cold snare. Resected and retrieved.                       - Two 3 to 4 mm polyps in the transverse colon, removed                        with a cold snare. Resected and retrieved.                       - Diverticulosis in the sigmoid colon.                       - Non-bleeding internal hemorrhoids. Recommendation:       - Discharge patient to home.                       - Resume previous diet.                       - Continue present medications.                       - Await pathology results.                       - Repeat colonoscopy in 5 years if polyp adenoma and 10                        years if hyperplastic Procedure Code(s):    --- Professional ---                       [phone], Colonoscopy, flexible; with removal of tumor(s),                        polyp(s), or other lesion(s) by snare technique Diagnosis Code(s):    --- Professional ---                       Z12.11, Encounter for screening for malignant neoplasm                        of colon                       D12.2, Benign neoplasm of  ascending colon                       D12.3, Benign neoplasm of transverse colon (hepatic                        flexure or splenic flexure) CPT copyright 2016 American Medical Association. All rights reserved. The codes documented in this report are preliminary and upon coder review may  be revised to meet current compliance requirements. Lucilla Lame MD, MD 12/15/2016 8:19:55 AM This report has been signed electronically. Number of Addenda: 0 Note Initiated On: 12/15/2016 7:50 AM Scope Withdrawal Time: 0 hours 9 minutes 34 seconds  Total Procedure Duration: 0 hours 14 minutes 35 seconds       [facility]

## 2016-12-15 NOTE — Anesthesia Postprocedure Evaluation (Signed)
Anesthesia Post Note  Patient: Shane Boyd.  Procedure(s) Performed: Procedure(s) (LRB): COLONOSCOPY WITH PROPOFOL (N/A) POLYPECTOMY  Patient location during evaluation: PACU Anesthesia Type: MAC Level of consciousness: awake and alert and oriented Pain management: pain level controlled Vital Signs Assessment: post-procedure vital signs reviewed and stable Respiratory status: spontaneous breathing and nonlabored ventilation Cardiovascular status: stable Postop Assessment: no signs of nausea or vomiting and adequate PO intake Anesthetic complications: no    Estill Batten

## 2016-12-15 NOTE — Transfer of Care (Signed)
Immediate Anesthesia Transfer of Care Note  Patient: Shane Boyd.  Procedure(s) Performed: Procedure(s) with comments: COLONOSCOPY WITH PROPOFOL (N/A) - diabetic - oral meds POLYPECTOMY  Patient Location: PACU  Anesthesia Type: MAC  Level of Consciousness: awake, alert  and patient cooperative  Airway and Oxygen Therapy: Patient Spontanous Breathing and Patient connected to supplemental oxygen  Post-op Assessment: Post-op Vital signs reviewed, Patient's Cardiovascular Status Stable, Respiratory Function Stable, Patent Airway and No signs of Nausea or vomiting  Post-op Vital Signs: Reviewed and stable  Complications: No apparent anesthesia complications

## 2016-12-15 NOTE — H&P (Signed)
Shane Lame, MD [address] Shane [place], [place] [address] Phone: [phone] Fax : [phone]  Primary Care Physician:  Loistine Chance, MD Primary Gastroenterologist:  Dr. Allen Norris  Pre-Procedure History & Physical: HPI:  Shane Boyd. is a 66 y.o. male is here for a screening colonoscopy.   Past Medical History:  Diagnosis Date  . Anemia   . Anxiety   . Chronic insomnia   . Constipation   . Depression   . Diabetes mellitus without complication ([place])   . Encounter for long-term (current) use of other high-risk medications   . GERD (gastroesophageal reflux disease)   . History of malignant melanoma    Dr. Koleen Nimrod  . History of squamous cell carcinoma   . Hx of basal cell carcinoma   . Hyperlipidemia   . Leukocytosis   . Obesity   . Other male erectile dysfunction   . Vertigo    1-2x/yr    Past Surgical History:  Procedure Laterality Date  . COLONOSCOPY    . NASAL SEPTUM SURGERY      Prior to Admission medications   Medication Sig Start Date End Date Taking? Authorizing Provider  ALPRAZolam Duanne Moron) 1 MG tablet Take 0.5-1 tablets (0.5-1 mg total) by mouth 2 (two) times daily as needed for anxiety. 09/16/16  Yes Steele Sizer, MD  amphetamine-dextroamphetamine (ADDERALL) 15 MG tablet Take 1 tablet by mouth 2 (two) times daily. 09/16/16  Yes Steele Sizer, MD  aspirin EC 81 MG tablet Take 1 tablet (81 mg total) by mouth daily. 07/29/15  Yes Steele Sizer, MD  atorvastatin (LIPITOR) 80 MG tablet TAKE ONE TABLET BY MOUTH ONCE DAILY 11/21/16  Yes Steele Sizer, MD  buPROPion (WELLBUTRIN XL) 300 MG 24 hr tablet Take 1 tablet (300 mg total) by mouth daily. 05/10/16  Yes Steele Sizer, MD  docusate sodium (COLACE) 100 MG capsule Take 1 capsule (100 mg total) by mouth 2 (two) times daily. 07/29/15  Yes Steele Sizer, MD  metFORMIN (GLUCOPHAGE) 500 MG tablet Take 1 tablet (500 mg total) by mouth 2 (two) times daily. 06/08/16  Yes Steele Sizer, MD    omeprazole (PRILOSEC) 40 MG capsule TAKE ONE CAPSULE BY MOUTH ONCE DAILY 12/14/16  Yes Steele Sizer, MD  tiZANidine (ZANAFLEX) 4 MG capsule TAKE ONE CAPSULE BY MOUTH THREE TIMES DAILY 10/06/16  Yes Steele Sizer, MD  valACYclovir (VALTREX) 500 MG tablet Take 1 tablet (500 mg total) by mouth 3 (three) times daily. 06/08/16  Yes Steele Sizer, MD    Allergies as of 11/09/2016  . (No Known Allergies)    Family History  Problem Relation Age of Onset  . Diabetes Mother   . Heart disease Mother   . Hyperlipidemia Mother   . CVA Father   . Cancer Father     Colon  . Depression Father     Social History   Social History  . Marital status: Married    Spouse name: N/A  . Number of children: N/A  . Years of education: N/A   Occupational History  . retired    Social History Main Topics  . Smoking status: Never Smoker  . Smokeless tobacco: Never Used  . Alcohol use 0.0 oz/week     Comment: Holidays  . Drug use: No  . Sexual activity: Yes   Other Topics Concern  . Not on file   Social History Narrative   One son: [name] lives in [place]   One son Trip , died from possible suicide  while living in [place]    Review of Systems: See HPI, otherwise negative ROS  Physical Exam: BP (!) 163/78   Pulse 74   Temp 97.8 F (36.6 C) (Tympanic)   Resp 16   Ht 5\' 9"  (1.753 m)   Wt 206 lb (93.4 kg)   SpO2 97%   BMI 30.42 kg/m  General:   Alert,  pleasant and cooperative in NAD Head:  Normocephalic and atraumatic. Neck:  Supple; no masses or thyromegaly. Lungs:  Clear throughout to auscultation.    Heart:  Regular rate and rhythm. Abdomen:  Soft, nontender and nondistended. Normal bowel sounds, without guarding, and without rebound.   Neurologic:  Alert and  oriented x4;  grossly normal neurologically.  Impression/Plan: Shane Boyd. is now here to undergo a screening colonoscopy.  Risks, benefits, and alternatives regarding colonoscopy have been reviewed with the  patient.  Questions have been answered.  All parties agreeable.

## 2016-12-16 ENCOUNTER — Encounter: Payer: Self-pay | Admitting: Gastroenterology

## 2016-12-18 ENCOUNTER — Encounter: Payer: Self-pay | Admitting: Gastroenterology

## 2016-12-19 ENCOUNTER — Ambulatory Visit (INDEPENDENT_AMBULATORY_CARE_PROVIDER_SITE_OTHER): Payer: Medicare Other | Admitting: Family Medicine

## 2016-12-19 ENCOUNTER — Encounter: Payer: Self-pay | Admitting: Family Medicine

## 2016-12-19 VITALS — BP 110/64 | HR 94 | Temp 97.8°F | Resp 18 | Ht 69.0 in | Wt 214.1 lb

## 2016-12-19 DIAGNOSIS — E781 Pure hyperglyceridemia: Secondary | ICD-10-CM

## 2016-12-19 DIAGNOSIS — E1140 Type 2 diabetes mellitus with diabetic neuropathy, unspecified: Secondary | ICD-10-CM | POA: Diagnosis not present

## 2016-12-19 DIAGNOSIS — F411 Generalized anxiety disorder: Secondary | ICD-10-CM

## 2016-12-19 DIAGNOSIS — E786 Lipoprotein deficiency: Secondary | ICD-10-CM

## 2016-12-19 DIAGNOSIS — N521 Erectile dysfunction due to diseases classified elsewhere: Secondary | ICD-10-CM | POA: Diagnosis not present

## 2016-12-19 DIAGNOSIS — E785 Hyperlipidemia, unspecified: Secondary | ICD-10-CM | POA: Diagnosis not present

## 2016-12-19 DIAGNOSIS — E782 Mixed hyperlipidemia: Secondary | ICD-10-CM

## 2016-12-19 DIAGNOSIS — E1169 Type 2 diabetes mellitus with other specified complication: Secondary | ICD-10-CM

## 2016-12-19 DIAGNOSIS — G4700 Insomnia, unspecified: Secondary | ICD-10-CM

## 2016-12-19 DIAGNOSIS — F339 Major depressive disorder, recurrent, unspecified: Secondary | ICD-10-CM | POA: Diagnosis not present

## 2016-12-19 LAB — POCT GLYCOSYLATED HEMOGLOBIN (HGB A1C): HEMOGLOBIN A1C: 6.3

## 2016-12-19 MED ORDER — ALPRAZOLAM 1 MG PO TABS: 0.5000 mg | ORAL_TABLET | Freq: Two times a day (BID) | ORAL | 2 refills | Status: DC | PRN

## 2016-12-19 MED ORDER — AMPHETAMINE-DEXTROAMPHETAMINE 15 MG PO TABS: 15.0000 mg | ORAL_TABLET | Freq: Two times a day (BID) | ORAL | 0 refills | Status: DC

## 2016-12-19 MED ORDER — OMEGA-3-ACID ETHYL ESTERS 1 G PO CAPS: 2.0000 g | ORAL_CAPSULE | Freq: Two times a day (BID) | ORAL | 1 refills | Status: DC

## 2016-12-19 MED ORDER — BUPROPION HCL ER (XL) 300 MG PO TB24: 300.0000 mg | ORAL_TABLET | Freq: Every day | ORAL | 1 refills | Status: DC

## 2016-12-19 MED ORDER — METFORMIN HCL 500 MG PO TABS: 500.0000 mg | ORAL_TABLET | Freq: Two times a day (BID) | ORAL | 1 refills | Status: DC

## 2016-12-19 NOTE — Progress Notes (Signed)
Name: Shane Boyd.   MRN: [id number]    DOB: October 31, 1951   Date:12/19/2016       Progress Note  Subjective  Chief Complaint  Chief Complaint  Patient presents with  . Medication Refill    3 month F/U  . Diabetes    Checks every so often in the mornings, averages-105  . Erectile Dysfunction  . Depression  . Gastroesophageal Reflux    Takes daily to control symptoms  . Insomnia    Well controlled, Average 6-7 hours nightly    HPI  DMII with ED: he is taking Metformin twice daily, denies polyphagia, polydipsia or polyuria. He has ED . He lost 3 lbs more pounds since last visit. He has low HDL and high triglycerides. His hgbA1C is at goal   ED: taking generic Viagra and is doing well, denies side effects of medication.   Major Depressive Disorder: he is doing better now, he states he stopped Celexa because of side effects. Sexual drive has improved, doing better at home. Denies suicidal ideation. They are going to see bands playing, holidays was hard, but doing better again  GAD: discussed importance of going down on BZD dose. He still feels anxious, denies recent panic attack. We will decrease Alprazolam from 45 pills to 40 pills monthly.   GERD: he takes medication daily, if he skips medication symptoms are severe , he is aware of possible side effects of medication. He does not want to try weaning self off at this time .  Dyslipidemia: taking statin therapy and denies myalgia. No chest pain. Reviewed labs and discussed adding Lovaza and he is willing to try, he stopped eating fast food, but is willing to try medication .  Low HDL and elevated triglycerides.   Insomnia: taking Alprazolam prn and is working well for him, however discussed changing to Alprazolam XR, since he is taking immediate release twice daily for mild anxiety also. He will try taking half pill at night instead of one.   Patient Active Problem List   Diagnosis Date Noted  . Special screening for  malignant neoplasms, colon   . Benign neoplasm of ascending colon   . Benign neoplasm of transverse colon   . Hearing loss, sensorineural, unilateral 10/21/2016  . Dyslipidemia with low high density lipoprotein (HDL) cholesterol with hypertriglyceridemia due to type 2 diabetes mellitus ([place]) 09/16/2016  . GAD (generalized anxiety disorder) 09/16/2016  . Genital herpes 06/08/2016  . Dizziness 03/09/2016  . Neck pain 07/29/2015  . Anxiety 07/19/2015  . Insomnia, persistent 07/19/2015  . CN (constipation) 07/19/2015  . 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 Dyslipidemia 07/19/2015  . Gastric reflux 07/19/2015  . History of shingles 07/19/2015  . History of basal cell cancer 07/19/2015  . H/O Malignant melanoma 07/19/2015  . Personal history of malignant neoplasm of head and neck 07/19/2015  . Chronic recurrent major depressive disorder ([place]) 07/19/2015  . Obesity (BMI 30.0-34.9) 07/19/2015  . ED (erectile dysfunction) 07/19/2015    Past Surgical History:  Procedure Laterality Date  . COLONOSCOPY    . COLONOSCOPY WITH PROPOFOL N/A 12/15/2016   Procedure: COLONOSCOPY WITH PROPOFOL;  Surgeon: Lucilla Lame, MD;  Location: [facility];  Service: Endoscopy;  Laterality: N/A;  diabetic - oral meds  . NASAL SEPTUM SURGERY    . POLYPECTOMY  12/15/2016   Procedure: POLYPECTOMY;  Surgeon: Lucilla Lame, MD;  Location: [facility];  Service: Endoscopy;;    Family History  Problem  Relation Age of Onset  . Diabetes Mother   . Heart disease Mother   . Hyperlipidemia Mother   . CVA Father   . Cancer Father     Colon  . Depression Father     Social History   Social History  . Marital status: Married    Spouse name: N/A  . Number of children: N/A  . Years of education: N/A   Occupational History  . retired    Social History Main Topics  . Smoking status: Never Smoker  . Smokeless tobacco: Never Used  . Alcohol use 0.0  oz/week     Comment: Holidays  . Drug use: No  . Sexual activity: Yes    Partners: Female   Other Topics Concern  . Not on file   Social History Narrative   One son: [name] lives in [place]   One son Trip , died from possible suicide while living in [place]     Current Outpatient Prescriptions:  .  ALPRAZolam (XANAX) 1 MG tablet, Take 0.5-1 tablets (0.5-1 mg total) by mouth 2 (two) times daily as needed for anxiety., Disp: 40 tablet, Rfl: 2 .  amphetamine-dextroamphetamine (ADDERALL) 15 MG tablet, Take 1 tablet by mouth 2 (two) times daily., Disp: 60 tablet, Rfl: 0 .  aspirin EC 81 MG tablet, Take 1 tablet (81 mg total) by mouth daily., Disp: 30 tablet, Rfl: 0 .  atorvastatin (LIPITOR) 80 MG tablet, TAKE ONE TABLET BY MOUTH ONCE DAILY, Disp: 90 tablet, Rfl: 1 .  buPROPion (WELLBUTRIN XL) 300 MG 24 hr tablet, Take 1 tablet (300 mg total) by mouth daily., Disp: 90 tablet, Rfl: 1 .  docusate sodium (COLACE) 100 MG capsule, Take 1 capsule (100 mg total) by mouth 2 (two) times daily., Disp: 30 capsule, Rfl: 0 .  metFORMIN (GLUCOPHAGE) 500 MG tablet, Take 1 tablet (500 mg total) by mouth 2 (two) times daily., Disp: 180 tablet, Rfl: 1 .  omeprazole (PRILOSEC) 40 MG capsule, TAKE ONE CAPSULE BY MOUTH ONCE DAILY, Disp: 90 capsule, Rfl: 1 .  tiZANidine (ZANAFLEX) 4 MG capsule, TAKE ONE CAPSULE BY MOUTH THREE TIMES DAILY, Disp: 90 capsule, Rfl: 2 .  valACYclovir (VALTREX) 500 MG tablet, Take 1 tablet (500 mg total) by mouth 3 (three) times daily., Disp: 30 tablet, Rfl: 2 .  amphetamine-dextroamphetamine (ADDERALL) 15 MG tablet, Take 1 tablet by mouth 2 (two) times daily. Fill 01/18/2017, Disp: 60 tablet, Rfl: 0 .  amphetamine-dextroamphetamine (ADDERALL) 15 MG tablet, Take 1 tablet by mouth 2 (two) times daily. Fill March 13 th, 2018, Disp: 60 tablet, Rfl: 0  No Known Allergies   ROS  Constitutional: Negative for fever or weight change.  Respiratory: Negative for cough and shortness of breath.    Cardiovascular: Negative for chest pain or palpitations.  Gastrointestinal: Negative for abdominal pain, no bowel changes.  Musculoskeletal: Negative for gait problem or joint swelling.  Skin: Negative for rash.  Neurological: Negative for dizziness or headache.  No other specific complaints in a complete review of systems (except as listed in HPI above).  Objective  Vitals:   12/19/16 1139  BP: 110/64  Pulse: 94  Resp: 18  Temp: 97.8 F (36.6 C)  TempSrc: Oral  SpO2: 94%  Weight: 214 lb 1.6 oz (97.1 kg)  Height: 5\' 9"  (1.753 m)    Body mass index is 31.62 kg/m.  Physical Exam  Constitutional: Patient appears well-developed and well-nourished. Obese No distress.  HEENT: head atraumatic, normocephalic, pupils equal and reactive to  light,  neck supple, throat within normal limits Cardiovascular: Normal rate, regular rhythm and normal heart sounds.  No murmur heard. No BLE edema. Pulmonary/Chest: Effort normal and breath sounds normal. No respiratory distress. Abdominal: Soft.  There is no tenderness. Psychiatric: Patient has a normal mood and affect. behavior is normal. Judgment and thought content normal.  Recent Results (from the past 2160 hour(s))  Glucose, capillary     Status: Abnormal   Collection Time: 12/15/16  7:11 AM  Result Value Ref Range   Glucose-Capillary 101 (H) 65 - 99 mg/dL  Glucose, capillary     Status: None   Collection Time: 12/15/16  8:26 AM  Result Value Ref Range   Glucose-Capillary 95 65 - 99 mg/dL  POCT HgB A1C     Status: None   Collection Time: 12/19/16 11:44 AM  Result Value Ref Range   Hemoglobin A1C 6.3      PHQ2/9: Depression screen [facility] 2/9 12/19/2016 10/11/2016 09/16/2016 06/08/2016 03/09/2016  Decreased Interest 0 0 0 0 0  Down, Depressed, Hopeless 0 0 0 0 0  PHQ - 2 Score 0 0 0 0 0  Altered sleeping - - - - -  Tired, decreased energy - - - - -  Change in appetite - - - - -  Feeling bad or failure about yourself  - - - - -   Trouble concentrating - - - - -  Moving slowly or fidgety/restless - - - - -  Suicidal thoughts - - - - -  PHQ-9 Score - - - - -  Difficult doing work/chores - - - - -     Fall Risk: Fall Risk  12/19/2016 10/11/2016 09/16/2016 06/08/2016 03/09/2016  Falls in the past year? No No No No No  Number falls in past yr: - - - - -  Injury with Fall? - - - - -     Functional Status Survey: Is the patient deaf or have difficulty hearing?: No Does the patient have difficulty seeing, even when wearing glasses/contacts?: No Does the patient have difficulty concentrating, remembering, or making decisions?: No Does the patient have difficulty walking or climbing stairs?: No Does the patient have difficulty dressing or bathing?: No Does the patient have difficulty doing errands alone such as visiting a doctor's office or shopping?: No    Assessment & Plan  1. 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  Dispense: 180 tablet; Refill: 1  2. Dyslipidemia  We will try adding Lovaza  3. Insomnia, persistent  Trying to wean self off  4. Chronic recurrent major depressive disorder (HCC)  - amphetamine-dextroamphetamine (ADDERALL) 15 MG tablet; Take 1 tablet by mouth 2 (two) times daily.  Dispense: 60 tablet; Refill: 0 - amphetamine-dextroamphetamine (ADDERALL) 15 MG tablet; Take 1 tablet by mouth 2 (two) times daily. Fill 01/18/2017  Dispense: 60 tablet; Refill: 0 - amphetamine-dextroamphetamine (ADDERALL) 15 MG tablet; Take 1 tablet by mouth 2 (two) times daily. Fill March 13 th, 2018  Dispense: 60 tablet; Refill: 0 - buPROPion (WELLBUTRIN XL) 300 MG 24 hr tablet; Take 1 tablet (300 mg total) by mouth daily.  Dispense: 90 tablet; Refill: 1  5. GAD (generalized anxiety disorder)  - ALPRAZolam (XANAX) 1 MG tablet; Take 0.5-1 tablets (0.5-1 mg total) by  mouth 2 (two) times daily as needed for anxiety.  Dispense: 40 tablet; Refill: 2  6. Dyslipidemia with low high density lipoprotein (HDL) cholesterol 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)  - omega-3 acid ethyl esters (LOVAZA) 1 g capsule; Take 2 capsules (2 g total) by mouth 2 (two) times daily.  Dispense: 480 capsule; Refill: 1

## 2017-01-31 ENCOUNTER — Other Ambulatory Visit: Payer: Self-pay | Admitting: Family Medicine

## 2017-01-31 NOTE — Telephone Encounter (Signed)
Patient requesting refill of Tizanidine to [facility].

## 2017-02-20 ENCOUNTER — Encounter: Payer: Self-pay | Admitting: Family Medicine

## 2017-02-20 ENCOUNTER — Ambulatory Visit (INDEPENDENT_AMBULATORY_CARE_PROVIDER_SITE_OTHER): Payer: Medicare Other | Admitting: Family Medicine

## 2017-02-20 VITALS — BP 122/64 | HR 86 | Temp 97.9°F | Resp 16 | Ht 69.0 in | Wt 213.6 lb

## 2017-02-20 DIAGNOSIS — R519 Headache, unspecified: Secondary | ICD-10-CM

## 2017-02-20 DIAGNOSIS — R112 Nausea with vomiting, unspecified: Secondary | ICD-10-CM

## 2017-02-20 DIAGNOSIS — R42 Dizziness and giddiness: Secondary | ICD-10-CM | POA: Diagnosis not present

## 2017-02-20 DIAGNOSIS — R5383 Other fatigue: Secondary | ICD-10-CM

## 2017-02-20 DIAGNOSIS — Z8669 Personal history of other diseases of the nervous system and sense organs: Secondary | ICD-10-CM | POA: Diagnosis not present

## 2017-02-20 DIAGNOSIS — R51 Headache: Secondary | ICD-10-CM | POA: Diagnosis not present

## 2017-02-20 MED ORDER — SUMATRIPTAN SUCCINATE 50 MG PO TABS: 50.0000 mg | ORAL_TABLET | ORAL | 0 refills | Status: DC | PRN

## 2017-02-20 MED ORDER — TOPIRAMATE 50 MG PO TABS: 50.0000 mg | ORAL_TABLET | Freq: Every day | ORAL | 0 refills | Status: DC

## 2017-02-20 MED ORDER — PROMETHAZINE HCL 12.5 MG PO TABS: 12.5000 mg | ORAL_TABLET | Freq: Three times a day (TID) | ORAL | 0 refills | Status: DC | PRN

## 2017-02-20 NOTE — Progress Notes (Signed)
Name: Shane Boyd.   MRN: [id number]    DOB: [birth year]   Date:02/20/2017       Progress Note  Subjective  Chief Complaint  Chief Complaint  Patient presents with  . Dizziness    since the weekend  . Nausea    HPI  Dizziness: he states that about 3 weeks ago he was very sick with chills, body aches, fatigue, dizziness, nausea for about 24 hours and also lose stools. The worse symptoms was a lack of balance, no spinning sensation, but had a severe headache. He denies associated cold symptoms such as rhinorrhea, cough or sore throat. Symptoms improved for about one week and returned two days ago, could not get out of bed and was having chills and dizziness again. He is feeling better today, but is worried about the recurrence of symptoms. He states headache was localized on right frontal and temporal area, he had phonophobia and photophobia. He states he had one episode of migraine when he was in his 47's     Patient Active Problem List   Diagnosis Date Noted  . Special screening for malignant neoplasms, colon   . Benign neoplasm of ascending colon   . Benign neoplasm of transverse colon   . Hearing loss, sensorineural, unilateral 10/21/2016  . Dyslipidemia with low high density lipoprotein (HDL) cholesterol with hypertriglyceridemia due to type 2 diabetes mellitus ([place]) 09/16/2016  . GAD (generalized anxiety disorder) 09/16/2016  . Genital herpes 06/08/2016  . Dizziness 03/09/2016  . History of nonmelanoma skin cancer 09/30/2015  . Neck pain 07/29/2015  . Anxiety 07/19/2015  . Insomnia, persistent 07/19/2015  . CN (constipation) 07/19/2015  . 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 Dyslipidemia 07/19/2015  . Gastric reflux 07/19/2015  . History of shingles 07/19/2015  . History of basal cell cancer 07/19/2015  . H/O Malignant melanoma 07/19/2015  . Personal history of malignant neoplasm of head and neck 07/19/2015  . Chronic  recurrent major depressive disorder ([place]) 07/19/2015  . Obesity (BMI 30.0-34.9) 07/19/2015  . ED (erectile dysfunction) 07/19/2015    Past Surgical History:  Procedure Laterality Date  . COLONOSCOPY    . COLONOSCOPY WITH PROPOFOL N/A 12/15/2016   Procedure: COLONOSCOPY WITH PROPOFOL;  Surgeon: Lucilla Lame, MD;  Location: [facility];  Service: Endoscopy;  Laterality: N/A;  diabetic - oral meds  . NASAL SEPTUM SURGERY    . POLYPECTOMY  12/15/2016   Procedure: POLYPECTOMY;  Surgeon: Lucilla Lame, MD;  Location: [facility];  Service: Endoscopy;;    Family History  Problem Relation Age of Onset  . Diabetes Mother   . Heart disease Mother   . Hyperlipidemia Mother   . CVA Father   . Cancer Father     Colon  . Depression Father     Social History   Social History  . Marital status: Married    Spouse name: N/A  . Number of children: N/A  . Years of education: N/A   Occupational History  . retired    Social History Main Topics  . Smoking status: Never Smoker  . Smokeless tobacco: Never Used  . Alcohol use 0.0 oz/week     Comment: Holidays  . Drug use: No  . Sexual activity: Yes    Partners: Female   Other Topics Concern  . Not on file   Social History Narrative   One son: [name] lives in [place]   One son Trip , died from possible suicide  while living in [place]     Current Outpatient Prescriptions:  .  ALPRAZolam (XANAX) 1 MG tablet, Take 0.5-1 tablets (0.5-1 mg total) by mouth 2 (two) times daily as needed for anxiety., Disp: 40 tablet, Rfl: 2 .  amphetamine-dextroamphetamine (ADDERALL) 15 MG tablet, Take 1 tablet by mouth 2 (two) times daily., Disp: 60 tablet, Rfl: 0 .  amphetamine-dextroamphetamine (ADDERALL) 15 MG tablet, Take 1 tablet by mouth 2 (two) times daily. Fill 01/18/2017, Disp: 60 tablet, Rfl: 0 .  amphetamine-dextroamphetamine (ADDERALL) 15 MG tablet, Take 1 tablet by mouth 2 (two) times daily. Fill March 13 th, 2018, Disp: 60 tablet,  Rfl: 0 .  aspirin EC 81 MG tablet, Take 1 tablet (81 mg total) by mouth daily., Disp: 30 tablet, Rfl: 0 .  atorvastatin (LIPITOR) 80 MG tablet, TAKE ONE TABLET BY MOUTH ONCE DAILY, Disp: 90 tablet, Rfl: 1 .  buPROPion (WELLBUTRIN XL) 300 MG 24 hr tablet, Take 1 tablet (300 mg total) by mouth daily., Disp: 90 tablet, Rfl: 1 .  docusate sodium (COLACE) 100 MG capsule, Take 1 capsule (100 mg total) by mouth 2 (two) times daily., Disp: 30 capsule, Rfl: 0 .  metFORMIN (GLUCOPHAGE) 500 MG tablet, Take 1 tablet (500 mg total) by mouth 2 (two) times daily., Disp: 180 tablet, Rfl: 1 .  omega-3 acid ethyl esters (LOVAZA) 1 g capsule, Take 2 capsules (2 g total) by mouth 2 (two) times daily., Disp: 480 capsule, Rfl: 1 .  omeprazole (PRILOSEC) 40 MG capsule, TAKE ONE CAPSULE BY MOUTH ONCE DAILY, Disp: 90 capsule, Rfl: 1 .  tiZANidine (ZANAFLEX) 4 MG capsule, TAKE ONE CAPSULE BY MOUTH THREE TIMES DAILY, Disp: 90 capsule, Rfl: 2 .  valACYclovir (VALTREX) 500 MG tablet, Take 1 tablet (500 mg total) by mouth 3 (three) times daily., Disp: 30 tablet, Rfl: 2  No Known Allergies   ROS  Ten systems reviewed and is negative except as mentioned in HPI   Objective  Vitals:   02/20/17 1502  BP: 122/64  Pulse: 86  Resp: 16  Temp: 97.9 F (36.6 C)  SpO2: 98%  Weight: 213 lb 9 oz (96.9 kg)  Height: 5\' 9"  (1.753 m)    Body mass index is 31.54 kg/m.  Physical Exam  Constitutional: Patient appears well-developed and well-nourished. Obese  No distress.  HEENT: head atraumatic, normocephalic, pupils equal and reactive to light, ears normal TM,  neck supple, throat within normal limits Cardiovascular: Normal rate, regular rhythm and normal heart sounds.  No murmur heard. No BLE edema. Pulmonary/Chest: Effort normal and breath sounds normal. No respiratory distress. Abdominal: Soft.  There is no tenderness. Psychiatric: Patient has a normal mood and affect. behavior is normal. Judgment and thought content  normal. Neurological : Romberg negative, normal sensation, strength and gait  Recent Results (from the past 2160 hour(s))  Glucose, capillary     Status: Abnormal   Collection Time: 12/15/16  7:11 AM  Result Value Ref Range   Glucose-Capillary 101 (H) 65 - 99 mg/dL  Glucose, capillary     Status: None   Collection Time: 12/15/16  8:26 AM  Result Value Ref Range   Glucose-Capillary 95 65 - 99 mg/dL  POCT HgB A1C     Status: None   Collection Time: 12/19/16 11:44 AM  Result Value Ref Range   Hemoglobin A1C 6.3     PHQ2/9: Depression screen [facility] 2/9 12/19/2016 10/11/2016 09/16/2016 06/08/2016 03/09/2016  Decreased Interest 0 0 0 0 0  Down, Depressed, Hopeless 0  0 0 0 0  PHQ - 2 Score 0 0 0 0 0  Altered sleeping - - - - -  Tired, decreased energy - - - - -  Change in appetite - - - - -  Feeling bad or failure about yourself  - - - - -  Trouble concentrating - - - - -  Moving slowly or fidgety/restless - - - - -  Suicidal thoughts - - - - -  PHQ-9 Score - - - - -  Difficult doing work/chores - - - - -     Fall Risk: Fall Risk  12/19/2016 10/11/2016 09/16/2016 06/08/2016 03/09/2016  Falls in the past year? No No No No No  Number falls in past yr: - - - - -  Injury with Fall? - - - - -     Assessment & Plan   1. Dizziness   2. Nausea and vomiting in adult  - promethazine (PHENERGAN) 12.5 MG tablet; Take 1 tablet (12.5 mg total) by mouth every 8 (eight) hours as needed for nausea or vomiting.  Dispense: 20 tablet; Refill: 0  3. Fatigue, unspecified type   4. History of migraine headaches   5. Intractable episodic headache, unspecified headache type  He has a history of migraines, but no episodes in years. He states dizziness starts hours before  the severe headache. Discussed possible side effects of both medication. Also offered CT scan brain or referral to neurologist but he would like to hold off on that for now - SUMAtriptan (IMITREX) 50 MG tablet; Take 1 tablet (50 mg  total) by mouth every 2 (two) hours as needed for migraine. May repeat in 2 hours if headache persists or recurs.  Dispense: 10 tablet; Refill: 0 - topiramate (TOPAMAX) 50 MG tablet; Take 1-4 tablets (50-200 mg total) by mouth at bedtime.  Dispense: 100 tablet; Refill: 0

## 2017-03-10 ENCOUNTER — Encounter: Payer: Self-pay | Admitting: Family Medicine

## 2017-03-21 ENCOUNTER — Ambulatory Visit (INDEPENDENT_AMBULATORY_CARE_PROVIDER_SITE_OTHER): Payer: Medicare Other | Admitting: Family Medicine

## 2017-03-21 ENCOUNTER — Encounter: Payer: Self-pay | Admitting: Family Medicine

## 2017-03-21 VITALS — BP 118/58 | HR 89 | Temp 97.7°F | Resp 16 | Ht 69.0 in | Wt 216.2 lb

## 2017-03-21 DIAGNOSIS — R1110 Vomiting, unspecified: Secondary | ICD-10-CM

## 2017-03-21 DIAGNOSIS — R2689 Other abnormalities of gait and mobility: Secondary | ICD-10-CM

## 2017-03-21 DIAGNOSIS — F411 Generalized anxiety disorder: Secondary | ICD-10-CM

## 2017-03-21 DIAGNOSIS — R51 Headache: Secondary | ICD-10-CM | POA: Diagnosis not present

## 2017-03-21 DIAGNOSIS — F339 Major depressive disorder, recurrent, unspecified: Secondary | ICD-10-CM | POA: Diagnosis not present

## 2017-03-21 DIAGNOSIS — R519 Headache, unspecified: Secondary | ICD-10-CM

## 2017-03-21 DIAGNOSIS — N521 Erectile dysfunction due to diseases classified elsewhere: Secondary | ICD-10-CM | POA: Diagnosis not present

## 2017-03-21 DIAGNOSIS — E785 Hyperlipidemia, unspecified: Secondary | ICD-10-CM

## 2017-03-21 DIAGNOSIS — R809 Proteinuria, unspecified: Secondary | ICD-10-CM

## 2017-03-21 DIAGNOSIS — Z79899 Other long term (current) drug therapy: Secondary | ICD-10-CM | POA: Diagnosis not present

## 2017-03-21 DIAGNOSIS — E1129 Type 2 diabetes mellitus with other diabetic kidney complication: Secondary | ICD-10-CM | POA: Diagnosis not present

## 2017-03-21 DIAGNOSIS — IMO0001 Reserved for inherently not codable concepts without codable children: Secondary | ICD-10-CM

## 2017-03-21 DIAGNOSIS — K219 Gastro-esophageal reflux disease without esophagitis: Secondary | ICD-10-CM | POA: Diagnosis not present

## 2017-03-21 DIAGNOSIS — E1140 Type 2 diabetes mellitus with diabetic neuropathy, unspecified: Secondary | ICD-10-CM

## 2017-03-21 LAB — POCT UA - MICROALBUMIN: Microalbumin Ur, POC: 50 mg/L

## 2017-03-21 LAB — POCT GLYCOSYLATED HEMOGLOBIN (HGB A1C): Hemoglobin A1C: 6.7

## 2017-03-21 MED ORDER — AMPHETAMINE-DEXTROAMPHETAMINE 15 MG PO TABS: 15.0000 mg | ORAL_TABLET | Freq: Two times a day (BID) | ORAL | 0 refills | Status: DC

## 2017-03-21 MED ORDER — LISINOPRIL 10 MG PO TABS: 10.0000 mg | ORAL_TABLET | Freq: Every day | ORAL | 1 refills | Status: DC

## 2017-03-21 MED ORDER — OMEPRAZOLE 40 MG PO CPDR: 40.0000 mg | DELAYED_RELEASE_CAPSULE | Freq: Every day | ORAL | 0 refills | Status: DC

## 2017-03-21 MED ORDER — ALPRAZOLAM 1 MG PO TABS: 0.5000 mg | ORAL_TABLET | Freq: Every evening | ORAL | 2 refills | Status: DC | PRN

## 2017-03-21 MED ORDER — TOPIRAMATE 100 MG PO TABS: 100.0000 mg | ORAL_TABLET | Freq: Every day | ORAL | 0 refills | Status: DC

## 2017-03-21 NOTE — Progress Notes (Signed)
Name: Shane Boyd.   MRN: [id number]    DOB: 02-04-1951   Date:03/21/2017       Progress Note  Subjective  Chief Complaint  Chief Complaint  Patient presents with  . Diabetes  . Anxiety  . Hyperlipidemia  . 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 He has ED .  His hgbA1C is at goal, he has microalbuminuria now - and we will start lisinopril low dose, discussed possible side effects of medication  ED: taking generic Viagra and is doing well, denies side effects of medication.   Major Depressive Disorder: he is doing better now, he states he stopped Celexa because of side effects. Denies suicidal ideation. Her has not adjusted to his wife being home, struggling since she retired. No longer can listen to music all the time, she has been doing all the house work and he stays inside for days in a row. They don't have any friends and feels like his wife has different interests than he does.    GAD: discussed importance of going down on BZD dose. He still feels anxious, denies recent panic attack. We will decrease Alprazolam from 40 to 30 pills daily  GERD: he takes medication daily, he has noticed regurgitation at night, waking up with acid in his mouth. He has switched to Omeprazole at night, he is aware of long term use of PPI, but we will try increase dose to twice daily and monitor.   Dyslipidemia: taking statin therapy and denies myalgia. No chest pain. He is on Lovaza now. Denies side effects.  Dizziness: he states that a  couple of months he was very sick with chills, body aches, fatigue, dizziness, nausea for about 24 hours and also lose stools. The worse symptoms was a lack of balance, but no spinning sensation, but had a severe headache after episode. He denies associated cold symptoms such as rhinorrhea, cough or sore throat. Symptoms improved for about one week and returned about one month ago,  could  not get out of bed and was having chills and dizziness again. He states headache was localized on right frontal and temporal area, he had phonophobia and photophobia. He states he had one episode of migraine when he was in his 20's. We started him on Topamax and he denies any headaches, but he had one episode of dizziness ( lasted a couple of days ) but no episodes since he went up to 100 mg of Topamax. He has noticed that he has noticed that he has been off balance ( when showering ) a few times over the past month.    Patient Active Problem List   Diagnosis Date Noted  . Special screening for malignant neoplasms, colon   . Benign neoplasm of ascending colon   . Benign neoplasm of transverse colon   . Hearing loss, sensorineural, unilateral 10/21/2016  . Dyslipidemia with low high density lipoprotein (HDL) cholesterol with hypertriglyceridemia due to type 2 diabetes mellitus ([place]) 09/16/2016  . GAD (generalized anxiety disorder) 09/16/2016  . Genital herpes 06/08/2016  . Dizziness 03/09/2016  . History of nonmelanoma skin cancer 09/30/2015  . Neck pain 07/29/2015  . Anxiety 07/19/2015  . Insomnia, persistent 07/19/2015  . CN (constipation) 07/19/2015  . 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 Dyslipidemia 07/19/2015  . Gastric reflux 07/19/2015  . History of shingles 07/19/2015  . History of basal cell cancer  07/19/2015  . H/O Malignant melanoma 07/19/2015  . Personal history of malignant neoplasm of head and neck 07/19/2015  . Chronic recurrent major depressive disorder ([place]) 07/19/2015  . Obesity (BMI 30.0-34.9) 07/19/2015  . ED (erectile dysfunction) 07/19/2015    Past Surgical History:  Procedure Laterality Date  . COLONOSCOPY    . COLONOSCOPY WITH PROPOFOL N/A 12/15/2016   Procedure: COLONOSCOPY WITH PROPOFOL;  Surgeon: Lucilla Lame, MD;  Location: [facility];  Service: Endoscopy;  Laterality: N/A;  diabetic - oral meds  .  NASAL SEPTUM SURGERY    . POLYPECTOMY  12/15/2016   Procedure: POLYPECTOMY;  Surgeon: Lucilla Lame, MD;  Location: [facility];  Service: Endoscopy;;    Family History  Problem Relation Age of Onset  . Diabetes Mother   . Heart disease Mother   . Hyperlipidemia Mother   . CVA Father   . Cancer Father     Colon  . Depression Father     Social History   Social History  . Marital status: Married    Spouse name: N/A  . Number of children: N/A  . Years of education: N/A   Occupational History  . retired    Social History Main Topics  . Smoking status: Never Smoker  . Smokeless tobacco: Never Used  . Alcohol use 0.0 oz/week     Comment: Holidays  . Drug use: No  . Sexual activity: Yes    Partners: Female   Other Topics Concern  . Not on file   Social History Narrative   One son: [name] lives in [place]   One son Trip , died from possible suicide while living in [place]     Current Outpatient Prescriptions:  .  ALPRAZolam (XANAX) 1 MG tablet, Take 0.5-1 tablets (0.5-1 mg total) by mouth at bedtime as needed for anxiety., Disp: 30 tablet, Rfl: 2 .  amphetamine-dextroamphetamine (ADDERALL) 15 MG tablet, Take 1 tablet by mouth 2 (two) times daily., Disp: 60 tablet, Rfl: 0 .  amphetamine-dextroamphetamine (ADDERALL) 15 MG tablet, Take 1 tablet by mouth 2 (two) times daily. Fill 04/19/2017, Disp: 60 tablet, Rfl: 0 .  amphetamine-dextroamphetamine (ADDERALL) 15 MG tablet, Take 1 tablet by mouth 2 (two) times daily. Fill June 14 th, 2018, Disp: 60 tablet, Rfl: 0 .  aspirin EC 81 MG tablet, Take 1 tablet (81 mg total) by mouth daily., Disp: 30 tablet, Rfl: 0 .  atorvastatin (LIPITOR) 80 MG tablet, TAKE ONE TABLET BY MOUTH ONCE DAILY, Disp: 90 tablet, Rfl: 1 .  buPROPion (WELLBUTRIN XL) 300 MG 24 hr tablet, Take 1 tablet (300 mg total) by mouth daily., Disp: 90 tablet, Rfl: 1 .  docusate sodium (COLACE) 100 MG capsule, Take 1 capsule (100 mg total) by mouth 2 (two) times  daily., Disp: 30 capsule, Rfl: 0 .  lisinopril (PRINIVIL,ZESTRIL) 10 MG tablet, Take 1 tablet (10 mg total) by mouth daily., Disp: 90 tablet, Rfl: 1 .  metFORMIN (GLUCOPHAGE) 500 MG tablet, Take 1 tablet (500 mg total) by mouth 2 (two) times daily., Disp: 180 tablet, Rfl: 1 .  omega-3 acid ethyl esters (LOVAZA) 1 g capsule, Take 2 capsules (2 g total) by mouth 2 (two) times daily., Disp: 480 capsule, Rfl: 1 .  omeprazole (PRILOSEC) 40 MG capsule, Take 1 capsule (40 mg total) by mouth daily., Disp: 180 capsule, Rfl: 0 .  promethazine (PHENERGAN) 12.5 MG tablet, Take 1 tablet (12.5 mg total) by mouth every 8 (eight) hours as needed for nausea or vomiting., Disp:  20 tablet, Rfl: 0 .  SUMAtriptan (IMITREX) 50 MG tablet, Take 1 tablet (50 mg total) by mouth every 2 (two) hours as needed for migraine. May repeat in 2 hours if headache persists or recurs., Disp: 10 tablet, Rfl: 0 .  tiZANidine (ZANAFLEX) 4 MG capsule, TAKE ONE CAPSULE BY MOUTH THREE TIMES DAILY, Disp: 90 capsule, Rfl: 2 .  topiramate (TOPAMAX) 100 MG tablet, Take 1-2 tablets (100-200 mg total) by mouth at bedtime., Disp: 180 tablet, Rfl: 0 .  valACYclovir (VALTREX) 500 MG tablet, Take 1 tablet (500 mg total) by mouth 3 (three) times daily., Disp: 30 tablet, Rfl: 2  No Known Allergies   ROS  Constitutional: Negative for fever or weight change.  Respiratory: Negative for cough and shortness of breath.   Cardiovascular: Negative for chest pain or palpitations.  Gastrointestinal: Negative for abdominal pain, no bowel changes.  Musculoskeletal: Negative for gait problem or joint swelling.  Skin: Negative for rash.  Neurological: positive  for dizziness and intermittent  headaches.  No other specific complaints in a complete review of systems (except as listed in HPI above).  Objective  Vitals:   03/21/17 1130  BP: (!) 118/58  Pulse: 89  Resp: 16  Temp: 97.7 F (36.5 C)  SpO2: 97%  Weight: 216 lb 4 oz (98.1 kg)  Height: 5\' 9"   (1.753 m)    Body mass index is 31.93 kg/m.  Physical Exam  Constitutional: Patient appears well-developed and well-nourished. Obese  No distress.  HEENT: head atraumatic, normocephalic, pupils equal and reactive to light,  neck supple, throat within normal limits Cardiovascular: Normal rate, regular rhythm and normal heart sounds.  No murmur heard. No BLE edema. Pulmonary/Chest: Effort normal and breath sounds normal. No respiratory distress. Abdominal: Soft.  There is no tenderness. Psychiatric: Patient has a normal mood and affect. behavior is normal. Judgment and thought content normal.  Recent Results (from the past 2160 hour(s))  POCT HgB A1C     Status: Abnormal   Collection Time: 03/21/17 11:38 AM  Result Value Ref Range   Hemoglobin A1C 6.7   POCT UA - Microalbumin     Status: Abnormal   Collection Time: 03/21/17 11:39 AM  Result Value Ref Range   Microalbumin Ur, POC 50 mg/L   Creatinine, POC  mg/dL   Albumin/Creatinine Ratio, Urine, POC       PHQ2/9: Depression screen [facility] 2/9 12/19/2016 10/11/2016 09/16/2016 06/08/2016 03/09/2016  Decreased Interest 0 0 0 0 0  Down, Depressed, Hopeless 0 0 0 0 0  PHQ - 2 Score 0 0 0 0 0  Altered sleeping - - - - -  Tired, decreased energy - - - - -  Change in appetite - - - - -  Feeling bad or failure about yourself  - - - - -  Trouble concentrating - - - - -  Moving slowly or fidgety/restless - - - - -  Suicidal thoughts - - - - -  PHQ-9 Score - - - - -  Difficult doing work/chores - - - - -     Fall Risk: Fall Risk  12/19/2016 10/11/2016 09/16/2016 06/08/2016 03/09/2016  Falls in the past year? No No No No No  Number falls in past yr: - - - - -  Injury with Fall? - - - - -     Assessment & Plan  1. 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  Balance problems  - Ambulatory referral to Neurology  3. Regurgitation  Increase dose of Omeprazole to  twice daily and monitor  4. Chronic recurrent major depressive disorder (HCC)  Discussed adding Cymbalta and decrease Wellbutrin but he would like to hold off  They are married for almost 45 years, but discussed counseling, explained that they need to communicate better - amphetamine-dextroamphetamine (ADDERALL) 15 MG tablet; Take 1 tablet by mouth 2 (two) times daily.  Dispense: 60 tablet; Refill: 0 - amphetamine-dextroamphetamine (ADDERALL) 15 MG tablet; Take 1 tablet by mouth 2 (two) times daily. Fill 04/19/2017  Dispense: 60 tablet; Refill: 0 - amphetamine-dextroamphetamine (ADDERALL) 15 MG tablet; Take 1 tablet by mouth 2 (two) times daily. Fill June 14 th, 2018  Dispense: 60 tablet; Refill: 0  5. GAD (generalized anxiety disorder)  - ALPRAZolam (XANAX) 1 MG tablet; Take 0.5-1 tablets (0.5-1 mg total) by mouth at bedtime as needed for anxiety.  Dispense: 30 tablet; Refill: 2 We will try to decrease amount of alprazolam   6. Gastric reflux  - omeprazole (PRILOSEC) 40 MG capsule; Take 1 capsule (40 mg total) by mouth daily.  Dispense: 180 capsule; Refill: 0  7. Intractable episodic headache, unspecified headache type  - topiramate (TOPAMAX) 100 MG tablet; Take 1-2 tablets (100-200 mg total) by mouth at bedtime.  Dispense: 180 tablet; Refill: 0 - Ambulatory referral to Neurology  8. Diabetes mellitus with microalbuminuria (HCC)  - lisinopril (PRINIVIL,ZESTRIL) 10 MG tablet; Take 1 tablet (10 mg total) by mouth daily.  Dispense: 90 tablet; Refill: 1  9. Dyslipidemia  - Lipid panel  10. Long-term use of high-risk medication  - COMPLETE METABOLIC PANEL WITH GFR

## 2017-04-12 ENCOUNTER — Other Ambulatory Visit: Payer: Self-pay | Admitting: Family Medicine

## 2017-04-12 LAB — COMPLETE METABOLIC PANEL WITH GFR
ALBUMIN: 4.1 g/dL (ref 3.6–5.1)
ALT: 13 U/L (ref 9–46)
AST: 13 U/L (ref 10–35)
Alkaline Phosphatase: 87 U/L (ref 40–115)
BUN: 15 mg/dL (ref 7–25)
CALCIUM: 8.5 mg/dL — AB (ref 8.6–10.3)
CO2: 24 mmol/L (ref 20–31)
Chloride: 110 mmol/L (ref 98–110)
Creat: 1.08 mg/dL (ref 0.70–1.25)
GFR, EST AFRICAN AMERICAN: 82 mL/min (ref >=60)
GFR, Est Non African American: 71 mL/min (ref >=60)
Glucose, Bld: 102 mg/dL — ABNORMAL HIGH (ref 65–99)
Potassium: 4.5 mmol/L (ref 3.5–5.3)
Sodium: 142 mmol/L (ref 135–146)
TOTAL PROTEIN: 6.2 g/dL (ref 6.1–8.1)
Total Bilirubin: 0.3 mg/dL (ref 0.2–1.2)

## 2017-04-12 LAB — LIPID PANEL
CHOL/HDL RATIO: 3.8 ratio (ref <5.0)
Cholesterol: 142 mg/dL (ref <200)
HDL: 37 mg/dL — AB (ref >40)
LDL CALC: 52 mg/dL (ref <100)
TRIGLYCERIDES: 263 mg/dL — AB (ref <150)
VLDL: 53 mg/dL — ABNORMAL HIGH (ref <30)

## 2017-04-24 ENCOUNTER — Encounter: Payer: Self-pay | Admitting: Family Medicine

## 2017-05-08 ENCOUNTER — Encounter: Payer: Self-pay | Admitting: Family Medicine

## 2017-05-08 DIAGNOSIS — C4A9 Merkel cell carcinoma, unspecified: Secondary | ICD-10-CM

## 2017-05-09 ENCOUNTER — Encounter: Payer: Self-pay | Admitting: Family Medicine

## 2017-05-09 DIAGNOSIS — K219 Gastro-esophageal reflux disease without esophagitis: Secondary | ICD-10-CM

## 2017-05-09 DIAGNOSIS — C4A61 Merkel cell carcinoma of right upper limb, including shoulder: Secondary | ICD-10-CM | POA: Insufficient documentation

## 2017-05-09 MED ORDER — OMEPRAZOLE 40 MG PO CPDR: 40.0000 mg | DELAYED_RELEASE_CAPSULE | Freq: Two times a day (BID) | ORAL | 0 refills | Status: DC

## 2017-05-12 ENCOUNTER — Encounter: Payer: Self-pay | Admitting: Family Medicine

## 2017-05-14 ENCOUNTER — Encounter: Payer: Self-pay | Admitting: Family Medicine

## 2017-05-15 ENCOUNTER — Encounter: Payer: Self-pay | Admitting: Family Medicine

## 2017-05-16 DIAGNOSIS — R29818 Other symptoms and signs involving the nervous system: Secondary | ICD-10-CM | POA: Insufficient documentation

## 2017-05-18 HISTORY — PX: SENTINEL NODE BIOPSY: SHX6608

## 2017-05-18 HISTORY — PX: ARM SKIN LESION BIOPSY / EXCISION: SUR471

## 2017-05-22 ENCOUNTER — Encounter: Payer: Self-pay | Admitting: Family Medicine

## 2017-05-23 ENCOUNTER — Ambulatory Visit: Payer: Medicare Other | Admitting: Family Medicine

## 2017-06-18 ENCOUNTER — Other Ambulatory Visit: Payer: Self-pay | Admitting: Family Medicine

## 2017-06-18 ENCOUNTER — Encounter: Payer: Self-pay | Admitting: Family Medicine

## 2017-06-18 DIAGNOSIS — E785 Hyperlipidemia, unspecified: Secondary | ICD-10-CM

## 2017-07-04 ENCOUNTER — Encounter: Payer: Self-pay | Admitting: Family Medicine

## 2017-07-04 ENCOUNTER — Ambulatory Visit (INDEPENDENT_AMBULATORY_CARE_PROVIDER_SITE_OTHER): Payer: Medicare Other | Admitting: Family Medicine

## 2017-07-04 VITALS — BP 106/62 | HR 105 | Temp 98.2°F | Resp 16 | Wt 220.6 lb

## 2017-07-04 DIAGNOSIS — E782 Mixed hyperlipidemia: Principal | ICD-10-CM

## 2017-07-04 DIAGNOSIS — E1169 Type 2 diabetes mellitus with other specified complication: Secondary | ICD-10-CM | POA: Diagnosis not present

## 2017-07-04 DIAGNOSIS — E786 Lipoprotein deficiency: Secondary | ICD-10-CM | POA: Diagnosis not present

## 2017-07-04 DIAGNOSIS — F339 Major depressive disorder, recurrent, unspecified: Secondary | ICD-10-CM | POA: Diagnosis not present

## 2017-07-04 DIAGNOSIS — E781 Pure hyperglyceridemia: Secondary | ICD-10-CM | POA: Diagnosis not present

## 2017-07-04 DIAGNOSIS — N521 Erectile dysfunction due to diseases classified elsewhere: Secondary | ICD-10-CM

## 2017-07-04 DIAGNOSIS — E1140 Type 2 diabetes mellitus with diabetic neuropathy, unspecified: Secondary | ICD-10-CM | POA: Diagnosis not present

## 2017-07-04 DIAGNOSIS — C4A9 Merkel cell carcinoma, unspecified: Secondary | ICD-10-CM

## 2017-07-04 DIAGNOSIS — Z23 Encounter for immunization: Secondary | ICD-10-CM | POA: Diagnosis not present

## 2017-07-04 DIAGNOSIS — F411 Generalized anxiety disorder: Secondary | ICD-10-CM | POA: Diagnosis not present

## 2017-07-04 DIAGNOSIS — I890 Lymphedema, not elsewhere classified: Secondary | ICD-10-CM

## 2017-07-04 DIAGNOSIS — K219 Gastro-esophageal reflux disease without esophagitis: Secondary | ICD-10-CM

## 2017-07-04 MED ORDER — ALPRAZOLAM 1 MG PO TABS: 0.5000 mg | ORAL_TABLET | Freq: Every evening | ORAL | 2 refills | Status: DC | PRN

## 2017-07-04 MED ORDER — AMPHETAMINE-DEXTROAMPHETAMINE 15 MG PO TABS: 15.0000 mg | ORAL_TABLET | Freq: Two times a day (BID) | ORAL | 0 refills | Status: DC

## 2017-07-04 MED ORDER — BUPROPION HCL ER (XL) 300 MG PO TB24: 300.0000 mg | ORAL_TABLET | Freq: Every day | ORAL | 1 refills | Status: DC

## 2017-07-04 MED ORDER — OMEPRAZOLE 40 MG PO CPDR: 40.0000 mg | DELAYED_RELEASE_CAPSULE | Freq: Two times a day (BID) | ORAL | 1 refills | Status: DC

## 2017-07-04 NOTE — Patient Instructions (Signed)
Try taking Omeprazole only once a day, and take Ranitidine or Famotidine otc if needed in the pm's

## 2017-07-04 NOTE — Progress Notes (Signed)
Name: Shane Boyd.   MRN: [id number]    DOB: Mar 05, 1951   Date:07/04/2017       Progress Note  Subjective  Chief Complaint  Chief Complaint  Patient presents with  . Medication Refill  . Diabetes    Checks every so often, Average-110  . Depression  . Gastroesophageal Reflux    Taking twice daily  . Dyslipidemia    HPI  DMII with ED: he is taking Metformin twice daily, denies polyphagia, polydipsia or polyuria. He has ED .  His hgbA1C has been at goal, he has microalbuminuria also - he is on low dose ace.  ED: taking generic Viagra and is doing well, denies side effects of medication.   Major Depressive Disorder: he is doing better now, he was in survival mode when getting treated for Merkel skin cancer, and feels grateful now that he is doing well.  He stopped Celexa months ago  because of side effects. Denies suicidal ideation. Her has not adjusted to his wife being home, struggling since she retired. No longer can listen to music all the time   GAD: discussed importance of going down on BZD dose. He still feels anxious, denies recent panic attack. He is down to 30 pills monthly, but thinks taking less than that . Advised him to bring the amount of pills he is taking per month on his next visit  GERD: he takes medication daily, he has noticed regurgitation at night, waking up with acid in his mouth. He has switched to Omeprazole at night, he is aware of long term use of PPI, but we will try increase dose to twice daily and monitor.   Dyslipidemia: taking statin therapy and denies myalgia. No chest pain. He is on Lovaza now. Denies side effects. We will recheck labs yearly  History of skin cancer: previous history of melanoma and recent surgery for excision of right upper extremity Merkel Cell Carcinoma. He states his arm swelling has improve, but still difficulty putting ring on.   Patient Active Problem List   Diagnosis Date Noted  . Difficulty balancing 05/16/2017  .  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 Special screening for malignant neoplasms, colon   . Benign neoplasm of ascending colon   . Benign neoplasm of transverse colon   . Hearing loss, sensorineural, unilateral 10/21/2016  . Dyslipidemia with low high density lipoprotein (HDL) cholesterol with hypertriglyceridemia due to type 2 diabetes mellitus ([place]) 09/16/2016  . GAD (generalized anxiety disorder) 09/16/2016  . Genital herpes 06/08/2016  . Dizziness 03/09/2016  . History of nonmelanoma skin cancer 09/30/2015  . Neck pain 07/29/2015  . Anxiety 07/19/2015  . Insomnia, persistent 07/19/2015  . CN (constipation) 07/19/2015  . 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 Dyslipidemia 07/19/2015  . Gastric reflux 07/19/2015  . History of shingles 07/19/2015  . History of basal cell cancer 07/19/2015  . H/O Malignant melanoma 07/19/2015  . Personal history of malignant neoplasm of head and neck 07/19/2015  . Chronic recurrent major depressive disorder ([place]) 07/19/2015  . Obesity (BMI 30.0-34.9) 07/19/2015  . ED (erectile dysfunction) 07/19/2015    Past Surgical History:  Procedure Laterality Date  . ARM SKIN LESION BIOPSY / EXCISION Right 05/18/2017  . COLONOSCOPY    . COLONOSCOPY WITH PROPOFOL N/A 12/15/2016   Procedure: COLONOSCOPY WITH PROPOFOL;  Surgeon: Lucilla Lame, MD;  Location: [facility];  Service: Endoscopy;  Laterality: N/A;  diabetic - oral meds  .  NASAL SEPTUM SURGERY    . POLYPECTOMY  12/15/2016   Procedure: POLYPECTOMY;  Surgeon: Lucilla Lame, MD;  Location: [facility];  Service: Endoscopy;;  . SENTINEL NODE BIOPSY Right 05/18/2017   [facility]    Family History  Problem Relation Age of Onset  . Diabetes Mother   . Heart disease Mother   . Hyperlipidemia Mother   . CVA Father   . Cancer Father        Colon  . Depression Father     Social History   Social History  . Marital status:  Married    Spouse name: N/A  . Number of children: N/A  . Years of education: N/A   Occupational History  . retired    Social History Main Topics  . Smoking status: Never Smoker  . Smokeless tobacco: Never Used  . Alcohol use 0.0 oz/week     Comment: Holidays  . Drug use: No  . Sexual activity: Yes    Partners: Female   Other Topics Concern  . Not on file   Social History Narrative   One son: [name] lives in [place]   One son Trip , died from possible suicide while living in [place]     Current Outpatient Prescriptions:  .  ALPRAZolam (XANAX) 1 MG tablet, Take 0.5-1 tablets (0.5-1 mg total) by mouth at bedtime as needed for anxiety., Disp: 30 tablet, Rfl: 2 .  amphetamine-dextroamphetamine (ADDERALL) 15 MG tablet, Take 1 tablet by mouth 2 (two) times daily., Disp: 60 tablet, Rfl: 0 .  amphetamine-dextroamphetamine (ADDERALL) 15 MG tablet, Take 1 tablet by mouth 2 (two) times daily. Fill 08/03/2017, Disp: 60 tablet, Rfl: 0 .  amphetamine-dextroamphetamine (ADDERALL) 15 MG tablet, Take 1 tablet by mouth 2 (two) times daily. Fill 09/02/2017, Disp: 60 tablet, Rfl: 0 .  aspirin EC 81 MG tablet, Take 1 tablet (81 mg total) by mouth daily., Disp: 30 tablet, Rfl: 0 .  atorvastatin (LIPITOR) 80 MG tablet, TAKE ONE TABLET BY MOUTH ONCE DAILY, Disp: 90 tablet, Rfl: 1 .  buPROPion (WELLBUTRIN XL) 300 MG 24 hr tablet, Take 1 tablet (300 mg total) by mouth daily., Disp: 90 tablet, Rfl: 1 .  docusate sodium (COLACE) 100 MG capsule, Take 1 capsule (100 mg total) by mouth 2 (two) times daily., Disp: 30 capsule, Rfl: 0 .  fluoruracil (CARAC) 0.5 % cream, Apply to affected areas of scalp, foreheard and temples twice dialy for 4-5 days., Disp: , Rfl: 0 .  lisinopril (PRINIVIL,ZESTRIL) 5 MG tablet, Take 5 mg at night., Disp: , Rfl:  .  metFORMIN (GLUCOPHAGE) 500 MG tablet, Take 1 tablet (500 mg total) by mouth 2 (two) times daily., Disp: 180 tablet, Rfl: 1 .  nortriptyline (PAMELOR) 10 MG capsule,  Take 2 capsules by mouth at bedtime., Disp: , Rfl:  .  omega-3 acid ethyl esters (LOVAZA) 1 g capsule, Take 2 capsules (2 g total) by mouth 2 (two) times daily., Disp: 480 capsule, Rfl: 1 .  omeprazole (PRILOSEC) 40 MG capsule, Take 1 capsule (40 mg total) by mouth 2 (two) times daily., Disp: 180 capsule, Rfl: 1 .  promethazine (PHENERGAN) 12.5 MG tablet, Take 1 tablet (12.5 mg total) by mouth every 8 (eight) hours as needed for nausea or vomiting., Disp: 20 tablet, Rfl: 0 .  SUMAtriptan (IMITREX) 50 MG tablet, Take 1 tablet (50 mg total) by mouth every 2 (two) hours as needed for migraine. May repeat in 2 hours if headache persists or recurs., Disp: 10 tablet,  Rfl: 0 .  tiZANidine (ZANAFLEX) 4 MG capsule, TAKE 1 CAPSULE BY MOUTH THREE TIMES DAILY, Disp: 90 capsule, Rfl: 2 .  valACYclovir (VALTREX) 500 MG tablet, Take 1 tablet (500 mg total) by mouth 3 (three) times daily., Disp: 30 tablet, Rfl: 2  No Known Allergies   ROS  Constitutional: Negative for fever or weight change.  Respiratory: Negative for cough and shortness of breath.   Cardiovascular: Negative for chest pain or palpitations.  Gastrointestinal: Negative for abdominal pain, no bowel changes.  Musculoskeletal: Negative for gait problem or joint swelling.  Skin: Negative for rash.  Neurological: Negative for dizziness or headache.  No other specific complaints in a complete review of systems (except as listed in HPI above).  Objective  Vitals:   07/04/17 1549  BP: 106/62  Pulse: (!) 105  Resp: 16  Temp: 98.2 F (36.8 C)  TempSrc: Oral  SpO2: 98%  Weight: 220 lb 9.6 oz (100.1 kg)    Body mass index is 32.58 kg/m.  Physical Exam  Constitutional: Patient appears well-developed and well-nourished. Obese  No distress.  HEENT: head atraumatic, normocephalic, pupils equal and reactive to light, , neck supple, throat within normal limits Cardiovascular: Normal rate, regular rhythm and normal heart sounds.  No murmur  heard.BLE edema. Right upper extremity lymphedema, scars well healed Pulmonary/Chest: Effort normal and breath sounds normal. No respiratory distress. Abdominal: Soft.  There is no tenderness. Psychiatric: Patient has a normal mood and affect. behavior is normal. Judgment and thought content normal.  Recent Results (from the past 2160 hour(s))  COMPLETE METABOLIC PANEL WITH GFR     Status: Abnormal   Collection Time: 04/12/17  8:55 AM  Result Value Ref Range   Sodium 142 135 - 146 mmol/L   Potassium 4.5 3.5 - 5.3 mmol/L   Chloride 110 98 - 110 mmol/L   CO2 24 20 - 31 mmol/L   Glucose, Bld 102 (H) 65 - 99 mg/dL   BUN 15 7 - 25 mg/dL   Creat 1.08 0.70 - 1.25 mg/dL    Comment:   For patients > or = 68 years of age: The upper reference limit for Creatinine is approximately 13% higher for people identified as African-American.      Total Bilirubin 0.3 0.2 - 1.2 mg/dL   Alkaline Phosphatase 87 40 - 115 U/L   AST 13 10 - 35 U/L   ALT 13 9 - 46 U/L   Total Protein 6.2 6.1 - 8.1 g/dL   Albumin 4.1 3.6 - 5.1 g/dL   Calcium 8.5 (L) 8.6 - 10.3 mg/dL   GFR, Est African American 82 >=60 mL/min   GFR, Est Non African American 71 >=60 mL/min  Lipid panel     Status: Abnormal   Collection Time: 04/12/17  8:55 AM  Result Value Ref Range   Cholesterol 142 <200 mg/dL   Triglycerides 263 (H) <150 mg/dL   HDL 37 (L) >40 mg/dL   Total CHOL/HDL Ratio 3.8 <5.0 Ratio   VLDL 53 (H) <30 mg/dL   LDL Cholesterol 52 <100 mg/dL     PHQ2/9: Depression screen [facility] 2/9 12/19/2016 10/11/2016 09/16/2016 06/08/2016 03/09/2016  Decreased Interest 0 0 0 0 0  Down, Depressed, Hopeless 0 0 0 0 0  PHQ - 2 Score 0 0 0 0 0  Altered sleeping - - - - -  Tired, decreased energy - - - - -  Change in appetite - - - - -  Feeling bad or failure about  yourself  - - - - -  Trouble concentrating - - - - -  Moving slowly or fidgety/restless - - - - -  Suicidal thoughts - - - - -  PHQ-9 Score - - - - -  Difficult doing  work/chores - - - - -     Fall Risk: Fall Risk  07/04/2017 12/19/2016 10/11/2016 09/16/2016 06/08/2016  Falls in the past year? No No No No No  Number falls in past yr: - - - - -  Injury with Fall? - - - - -  Comment - - - - -     Functional Status Survey: Is the patient deaf or have difficulty hearing?: No Does the patient have difficulty seeing, even when wearing glasses/contacts?: No Does the patient have difficulty concentrating, remembering, or making decisions?: No Does the patient have difficulty walking or climbing stairs?: No Does the patient have difficulty dressing or bathing?: No Does the patient have difficulty doing errands alone such as visiting a doctor's office or shopping?: No   Assessment & Plan  1. Dyslipidemia with low high density lipoprotein (HDL) cholesterol with hypertriglyceridemia due to type 2 diabetes mellitus ([place])  Continue current regiment   2. 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 Merkel cell carcinoma (HCC)  Negative sentinel notes  4. GAD (generalized anxiety disorder)  - ALPRAZolam (XANAX) 1 MG tablet; Take 0.5-1 tablets (0.5-1 mg total) by mouth at bedtime as needed for anxiety.  Dispense: 30 tablet; Refill: 2 He will count how many pills he takes per month  5. Chronic recurrent major depressive disorder (HCC)  - buPROPion (WELLBUTRIN XL) 300 MG 24 hr tablet; Take 1 tablet (300 mg total) by mouth daily.  Dispense: 90 tablet; Refill: 1 - amphetamine-dextroamphetamine (ADDERALL) 15 MG tablet; Take 1 tablet by mouth 2 (two) times daily.  Dispense: 60 tablet; Refill: 0 - amphetamine-dextroamphetamine (ADDERALL) 15 MG tablet; Take 1 tablet by mouth 2 (two) times daily. Fill 08/03/2017  Dispense: 60 tablet; Refill: 0 - amphetamine-dextroamphetamine (ADDERALL) 15 MG tablet; Take 1 tablet by mouth 2 (two) times daily. Fill 09/02/2017  Dispense: 60 tablet; Refill: 0  6.  Lymphedema of right arm  Discussed raising arm, wearing a arm sleeve, improving since surgery - per patient  7. Gastric reflux  - omeprazole (PRILOSEC) 40 MG capsule; Take 1 capsule (40 mg total) by mouth 2 (two) times daily.  Dispense: 180 capsule; Refill: 1  8. Need for pneumococcal vaccine  - Pneumococcal polysaccharide vaccine 23-valent greater than or equal to 2yo subcutaneous/IM

## 2017-07-27 ENCOUNTER — Ambulatory Visit: Payer: Medicare Other | Attending: Surgery | Admitting: Occupational Therapy

## 2017-07-27 DIAGNOSIS — I890 Lymphedema, not elsewhere classified: Secondary | ICD-10-CM | POA: Diagnosis not present

## 2017-07-27 NOTE — Therapy (Signed)
[facility] [address], [place], [address] Phone: [phone]   Fax:  [phone]  Occupational Therapy Evaluation  Patient Details  Name: Shane Boyd. MRN: [id number] Date of Birth: 1951-05-05 Referring Provider: Olen Pel  Encounter Date: 07/27/2017      OT End of Session - 07/27/17 1123    Visit Number 1   Number of Visits 12   Date for OT Re-Evaluation 09/07/17   OT Start Time 0805   OT Stop Time 0908   OT Time Calculation (min) 63 min   Activity Tolerance Patient tolerated treatment well   Behavior During Therapy [facility] for tasks assessed/performed      Past Medical History:  Diagnosis Date  . Anemia   . Anxiety   . Chronic insomnia   . Constipation   . Depression   . Diabetes mellitus without complication ([place])   . Encounter for long-term (current) use of other high-risk medications   . GERD (gastroesophageal reflux disease)   . History of malignant melanoma    Dr. Koleen Nimrod  . History of squamous cell carcinoma   . Hx of basal cell carcinoma   . Hyperlipidemia   . Leukocytosis   . Obesity   . Other male erectile dysfunction   . Vertigo    1-2x/yr    Past Surgical History:  Procedure Laterality Date  . ARM SKIN LESION BIOPSY / EXCISION Right 05/18/2017  . COLONOSCOPY    . COLONOSCOPY WITH PROPOFOL N/A 12/15/2016   Procedure: COLONOSCOPY WITH PROPOFOL;  Surgeon: Lucilla Lame, MD;  Location: [place];  Service: Endoscopy;  Laterality: N/A;  diabetic - oral meds  . NASAL SEPTUM SURGERY    . POLYPECTOMY  12/15/2016   Procedure: POLYPECTOMY;  Surgeon: Lucilla Lame, MD;  Location: [place];  Service: Endoscopy;;  . SENTINEL NODE BIOPSY Right 05/18/2017   [facility]    There were no vitals filed for this visit.      Subjective Assessment - 07/27/17 1118    Subjective  I had Melanoma and that was such a big shot - and then surgery - then I see all these scars - ROM is  good but just tight or thick feeling - swelling started about 3 wks after I had the surgery - and it is just staying same    Patient Stated Goals Want to get the swelling down - so my hand and arm feels normal again - I am R handed    Currently in Pain? Yes   Pain Score 2    Pain Location Arm   Pain Orientation Right   Pain Descriptors / Indicators Aching   Pain Type Surgical pain   Pain Onset 1 to 4 weeks ago           [facility] OT Assessment - 07/27/17 0001      Assessment   Diagnosis Lymphedema R arm - post melanoma surgery    Referring Provider meyers   Onset Date 05/18/17     Home  Environment   Lives With Spouse     Prior Function   Vocation Retired   Leisure R hand dominant - yard work , movies ,fix deck ,      AROM   Right Wrist Extension 60 Degrees   Right Wrist Flexion 70 Degrees     Strength   Right Hand Grip (lbs) 94   Left Hand Grip (lbs) 80  LYMPHEDEMA/ONCOLOGY QUESTIONNAIRE - 07/27/17 0825      Right Upper Extremity Lymphedema   15 cm Proximal to Olecranon Process 33 cm   10 cm Proximal to Olecranon Process 31.2 cm   Olecranon Process 30.8 cm   15 cm Proximal to Ulnar Styloid Process 29.8 cm   10 cm Proximal to Ulnar Styloid Process 25.4 cm   Just Proximal to Ulnar Styloid Process 19.3 cm   Across Hand at [employer] 24 cm   At [place] of 2nd Digit 7.5 cm   At [facility] of Thumb 7.3 cm     Left Upper Extremity Lymphedema   15 cm Proximal to Olecranon Process 32 cm   10 cm Proximal to Olecranon Process 28.7 cm   Olecranon Process 27.2 cm   15 cm Proximal to Ulnar Styloid Process 27.2 cm   10 cm Proximal to Ulnar Styloid Process 23.3 cm   Just Proximal to Ulnar Styloid Process 18 cm   Across Hand at [employer] 23 cm   At [place] of 2nd Digit 7.2 cm   At [facility] of Thumb 7 cm       Pt ed on doing MLD to R arm : 3. Pump across chest from right to left 8 times 4. Pump down the right side of trunk from armpit to groin 8 times 5. Pump up the  outside of right upper arm 8 times, inside of upper arm to outside 8x, outside of upper arm again 8x  6. Pump across chest from R to L 8 times 7. Pump  down the right side of trunk from armpit to groin 8 times 8. Pump top of forearm from wrist to elbow 8 times 9. Pump up the outside of right upper arm 8 times 10.       Pump across chest from right to left 8 times 11. Pump down the right side of trunk from armpit to groin 8 times 12. Pump up the back of the forearm from wrist to elbow 8 times 13. Pump up the outside of right upper arm 8 times 14. Pump across chest from right to left 8 times 15. Pump down the right side of trunk from armpit to groin 8 times  Hand out provided and review with pt  Scar massage  isotoner glove for R hand , tubi grip D for daytime  Ed on wrapping from hand to upper arm - middle of day few hours and at night time  Will reassess in  4 wks                    OT Education - 07/27/17 1123    Education provided Yes   Education Details findings and HEP   Person(s) Educated Patient   Methods Explanation;Demonstration;Tactile cues;Verbal cues;Handout   Comprehension Verbal cues required;Returned demonstration;Verbalized understanding          OT Short Term Goals - 07/27/17 1724      OT SHORT TERM GOAL #1   Title R UE circumference decrease from forearm to upper arm by 2 cm to be fitted with correct compression sleeve and glove    Baseline R UE increase compare to L - 1 hand , 1.3 at wrist , 2,6cm at forearm , 3.6 at elbow , 2.5 cm at upper arm    Time 3   Period Weeks   Status New   Target Date 08/17/17           OT Long Term  Goals - 07/27/17 1726      OT LONG TERM GOAL #1   Title Pt to be ind in HEP for MLD, bandaging , HEP to decrease circumference to be measure for appropriate compressoin garments    Baseline no knowledge    Time 3   Period Weeks   Status New   Target Date 08/17/17     OT LONG TERM GOAL #2   Title Pt to be  indepened in use of correct compression garments to maintain lymphedema in R UE    Baseline had over the counter sleeve before but did not work - no knowledge how to keep decrease lymphedema    Time 6   Period Weeks   Status New   Target Date 09/07/17               Plan - 07/27/17 1125    Clinical Impression Statement Pt present this date for eval - s/p 9 wks surgery for melanoma on R arm - had removal of sentinel ln ,R axilla; 1 sentinel ln R epitrochlear  - was both negative - had also excision in forearm - pt present this date with lymphedema that started about 3 wks  after surgery per pt  after questioning - pt report he started using his arm then about that time - he remember his axilla was swollen , and tight - but that is down now - but arm stays swollen and ROM feels tight , and skin feels "funny" - pt present with R dominant UE increase in circumference compare to L  by .3 cm in digits, 1 cm in hand,  1.3 cm in wrist ; 2.6 cm in forearm,  3.6cm at elbow , 2.5cm in upper arm - forearm feels tight , hard and fibrotic - pt did has compression sleeve that he tried in past - but did not had glove and hand swell - pt was fitted with isotoner glove , tubigrip on hand and forearm - educated to do one layer bandage at night time and few hours during day - and MLD - will reassess pt in 4 days if need to do CDT - and do 3 layer bandaging by OT  - pt at  risk for infection , causing some discomfort    Occupational performance deficits (Please refer to evaluation for details): ADL's;IADL's;Rest and Sleep;Leisure   Rehab Potential Good   OT Frequency 2x / week   OT Duration 6 weeks   OT Treatment/Interventions Self-care/ADL training;Manual lymph drainage;Patient/family education;Therapeutic exercises;Compression bandaging;Contrast Bath;Scar mobilization;Manual Therapy   Plan assess progress with homeprogram    Clinical Decision Making Limited treatment options, no task modification necessary   OT  Home Exercise Plan see pti instruction    Consulted and Agree with Plan of Care Patient      Patient will benefit from skilled therapeutic intervention in order to improve the following deficits and impairments:  Increased edema, Impaired flexibility, Pain, Decreased scar mobility, Impaired UE functional use  Visit Diagnosis: Lymphedema, not elsewhere classified - Plan: Ot plan of care cert/re-cert    Problem List Patient Active Problem List   Diagnosis Date Noted  . Difficulty balancing 05/16/2017  . 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 Special screening for malignant neoplasms, colon   . Benign neoplasm of ascending colon   . Benign neoplasm of transverse colon   . Hearing loss, sensorineural, unilateral 10/21/2016  . Dyslipidemia with low high density lipoprotein (HDL) cholesterol with hypertriglyceridemia  due to type 2 diabetes mellitus ([place]) 09/16/2016  . GAD (generalized anxiety disorder) 09/16/2016  . Genital herpes 06/08/2016  . Dizziness 03/09/2016  . History of nonmelanoma skin cancer 09/30/2015  . Neck pain 07/29/2015  . Anxiety 07/19/2015  . Insomnia, persistent 07/19/2015  . CN (constipation) 07/19/2015  . 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 Dyslipidemia 07/19/2015  . Gastric reflux 07/19/2015  . History of shingles 07/19/2015  . History of basal cell cancer 07/19/2015  . H/O Malignant melanoma 07/19/2015  . Personal history of malignant neoplasm of head and neck 07/19/2015  . Chronic recurrent major depressive disorder ([place]) 07/19/2015  . Obesity (BMI 30.0-34.9) 07/19/2015  . ED (erectile dysfunction) 07/19/2015    Rosalyn Gess OTR/L,CLT 07/27/2017, 5:35 PM  [facility] [address] [place], [address] Phone: [phone]   Fax:  [phone]  Name: Zakiah Beckerman. MRN: [id number] Date of Birth:  04/15/1951

## 2017-07-27 NOTE — Patient Instructions (Signed)
Pt ed on doing MLD to R arm : 3. Pump across chest from right to left 8 times 4. Pump down the right side of trunk from armpit to groin 8 times 5. Pump up the outside of right upper arm 8 times, inside of upper arm to outside 8x, outside of upper arm again 8x  6. Pump across chest from R to L 8 times 7. Pump  down the right side of trunk from armpit to groin 8 times 8. Pump top of forearm from wrist to elbow 8 times 9. Pump up the outside of right upper arm 8 times 10.       Pump across chest from right to left 8 times 11. Pump down the right side of trunk from armpit to groin 8 times 12. Pump up the back of the forearm from wrist to elbow 8 times 13. Pump up the outside of right upper arm 8 times 14. Pump across chest from right to left 8 times 15. Pump down the right side of trunk from armpit to groin 8 times  Hand out provided and review with pt  Scar massage  isotoner glove for R hand , tubi grip D for daytime  Ed on wrapping from hand to upper arm - middle of day few hours and at night time  Will reassess in  4 wks

## 2017-08-01 ENCOUNTER — Ambulatory Visit: Payer: Medicare Other | Admitting: Occupational Therapy

## 2017-08-01 DIAGNOSIS — I890 Lymphedema, not elsewhere classified: Secondary | ICD-10-CM

## 2017-08-01 NOTE — Therapy (Signed)
[facility] [address], [place], [address] Phone: [phone]   Fax:  [phone]  Occupational Therapy Treatment  Patient Details  Name: Shane Boyd. MRN: [id number] Date of Birth: 1950-12-08 Referring Provider: Olen Pel  Encounter Date: 08/01/2017      OT End of Session - 08/01/17 1533    Visit Number 2   Number of Visits 12   Date for OT Re-Evaluation 09/07/17   OT Start Time 1215   OT Stop Time 1255   OT Time Calculation (min) 40 min   Activity Tolerance Patient tolerated treatment well   Behavior During Therapy [facility] for tasks assessed/performed      Past Medical History:  Diagnosis Date  . Anemia   . Anxiety   . Chronic insomnia   . Constipation   . Depression   . Diabetes mellitus without complication ([place])   . Encounter for long-term (current) use of other high-risk medications   . GERD (gastroesophageal reflux disease)   . History of malignant melanoma    Dr. Koleen Nimrod  . History of squamous cell carcinoma   . Hx of basal cell carcinoma   . Hyperlipidemia   . Leukocytosis   . Obesity   . Other male erectile dysfunction   . Vertigo    1-2x/yr    Past Surgical History:  Procedure Laterality Date  . ARM SKIN LESION BIOPSY / EXCISION Right 05/18/2017  . COLONOSCOPY    . COLONOSCOPY WITH PROPOFOL N/A 12/15/2016   Procedure: COLONOSCOPY WITH PROPOFOL;  Surgeon: Lucilla Lame, MD;  Location: [place];  Service: Endoscopy;  Laterality: N/A;  diabetic - oral meds  . NASAL SEPTUM SURGERY    . POLYPECTOMY  12/15/2016   Procedure: POLYPECTOMY;  Surgeon: Lucilla Lame, MD;  Location: [place];  Service: Endoscopy;;  . SENTINEL NODE BIOPSY Right 05/18/2017   [facility]    There were no vitals filed for this visit.      Subjective Assessment - 08/01/17 1225    Subjective  I did the massage once a day - did not know my forearm was so swollen until  Imassage it - did keep  the sleeve on and bandage at night time and during day sitting at home    Patient Stated Goals Want to get the swelling down - so my hand and arm feels normal again - I am R handed    Currently in Pain? No/denies             LYMPHEDEMA/ONCOLOGY QUESTIONNAIRE - 08/01/17 1226      Right Upper Extremity Lymphedema   15 cm Proximal to Olecranon Process 32.5 cm   10 cm Proximal to Olecranon Process 31 cm   Olecranon Process 29.7 cm   15 cm Proximal to Ulnar Styloid Process 30 cm   10 cm Proximal to Ulnar Styloid Process 26 cm   Just Proximal to Ulnar Styloid Process 19.5 cm   Across Hand at [employer] 23 cm   At [place] of 2nd Digit 7.5 cm   At [facility] of Thumb 7.5 cm     Assess circumference in R UE - see flowsheet  Fibrotic techniques done to volar forearm -by OT and pt ed on doing at home and wife to help   MLD to R arm : 3. Pump across chest from right to left 8 times 4. Pump down the right side of trunk from armpit to groin 8 times 5.  Pump up the outside of right upper arm 8 times, inside of upper arm to outside 8x, outside of upper arm again 8x  6.         Pump across chest from R to L 8 times 7. Pump  down the right side of trunk from armpit to groin 8 times 8.         Pump top of forearm from wrist to elbow 8 times 9.         Pump up the outside of right upper arm 8 times 10.       Pump across chest from right to left 8 times 11. Pump down the right side of trunk from armpit to groin 8 times 12.       Pump up the back of the forearm from wrist to elbow 8 times 13.       Pump up the outside of right upper arm 8 times 14. Pump across chest from right to left 8 times 15. Pump down the right side of trunk from armpit to groin 8 times  Komprex foam cut and fitted under compression on volar forearm to decrease fibrosis Scar massage  New isotoner glove for R hand , tubi grip D for daytime use - cut new 2 Re - Ed on wrapping from hand to upper arm  Per pt request -  middle of day few hours and at night time                   OT Education - 08/01/17 1532    Education provided Yes   Education Details bandaging review, use of komprex piece and fibrotic tech for massage to volar forearm    Person(s) Educated Patient   Methods Explanation;Demonstration;Tactile cues;Verbal cues   Comprehension Verbal cues required;Returned demonstration;Verbalized understanding          OT Short Term Goals - 07/27/17 1724      OT SHORT TERM GOAL #1   Title R UE circumference decrease from forearm to upper arm by 2 cm to be fitted with correct compression sleeve and glove    Baseline R UE increase compare to L - 1 hand , 1.3 at wrist , 2,6cm at forearm , 3.6 at elbow , 2.5 cm at upper arm    Time 3   Period Weeks   Status New   Target Date 08/17/17           OT Long Term Goals - 07/27/17 1726      OT LONG TERM GOAL #1   Title Pt to be ind in HEP for MLD, bandaging , HEP to decrease circumference to be measure for appropriate compressoin garments    Baseline no knowledge    Time 3   Period Weeks   Status New   Target Date 08/17/17     OT LONG TERM GOAL #2   Title Pt to be indepened in use of correct compression garments to maintain lymphedema in R UE    Baseline had over the counter sleeve before but did not work - no knowledge how to keep decrease lymphedema    Time 6   Period Weeks   Status New   Target Date 09/07/17               Plan - 08/01/17 1534    Clinical Impression Statement Pt's circumference decrease at hand and upper arm - but forearm still increase and fibrotic this date - pt ed on fibrotic  techniques to do at home and use of komprex foam under compression on volar forearm - and cont wtih MLD    Occupational performance deficits (Please refer to evaluation for details): ADL's;IADL's;Rest and Sleep;Leisure   Rehab Potential Good   OT Frequency 2x / week   OT Duration 6 weeks   OT Treatment/Interventions  Self-care/ADL training;Manual lymph drainage;Patient/family education;Therapeutic exercises;Compression bandaging;Contrast Bath;Scar mobilization;Manual Therapy   Plan assess progres in circumference in volar forearm fibrosis   OT Home Exercise Plan see pti instruction    Consulted and Agree with Plan of Care Patient      Patient will benefit from skilled therapeutic intervention in order to improve the following deficits and impairments:  Increased edema, Impaired flexibility, Pain, Decreased scar mobility, Impaired UE functional use  Visit Diagnosis: Lymphedema, not elsewhere classified    Problem List Patient Active Problem List   Diagnosis Date Noted  . Difficulty balancing 05/16/2017  . 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 Special screening for malignant neoplasms, colon   . Benign neoplasm of ascending colon   . Benign neoplasm of transverse colon   . Hearing loss, sensorineural, unilateral 10/21/2016  . Dyslipidemia with low high density lipoprotein (HDL) cholesterol with hypertriglyceridemia due to type 2 diabetes mellitus ([place]) 09/16/2016  . GAD (generalized anxiety disorder) 09/16/2016  . Genital herpes 06/08/2016  . Dizziness 03/09/2016  . History of nonmelanoma skin cancer 09/30/2015  . Neck pain 07/29/2015  . Anxiety 07/19/2015  . Insomnia, persistent 07/19/2015  . CN (constipation) 07/19/2015  . 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 Dyslipidemia 07/19/2015  . Gastric reflux 07/19/2015  . History of shingles 07/19/2015  . History of basal cell cancer 07/19/2015  . H/O Malignant melanoma 07/19/2015  . Personal history of malignant neoplasm of head and neck 07/19/2015  . Chronic recurrent major depressive disorder ([place]) 07/19/2015  . Obesity (BMI 30.0-34.9) 07/19/2015  . ED (erectile dysfunction) 07/19/2015    Rosalyn Gess OTR/L,CLT 08/01/2017, 3:38 PM  [facility] [address], [place], [address] Phone: [phone]   Fax:  [phone]  Name: Shane Boyd. MRN: [id number] Date of Birth: [birth year]

## 2017-08-01 NOTE — Patient Instructions (Signed)
Pt to cont with MLD taught last time  Fibrotic tech to be done on volar forearm by pt and wife  Komprex foam cut for volar forearm to be use under tubigrip and bandage to decrease fibrotic tissue  Cont with compression and bandaging

## 2017-08-02 ENCOUNTER — Other Ambulatory Visit: Payer: Self-pay | Admitting: Family Medicine

## 2017-08-02 DIAGNOSIS — N521 Erectile dysfunction due to diseases classified elsewhere: Principal | ICD-10-CM

## 2017-08-02 DIAGNOSIS — E1140 Type 2 diabetes mellitus with diabetic neuropathy, unspecified: Secondary | ICD-10-CM

## 2017-08-02 NOTE — Telephone Encounter (Signed)
Patient requesting refill of Metformin to [facility].  

## 2017-08-08 ENCOUNTER — Ambulatory Visit: Payer: Medicare Other | Attending: Surgery | Admitting: Occupational Therapy

## 2017-08-08 DIAGNOSIS — I890 Lymphedema, not elsewhere classified: Secondary | ICD-10-CM | POA: Diagnosis present

## 2017-08-08 NOTE — Patient Instructions (Signed)
Pt cont with same home program this week

## 2017-08-08 NOTE — Therapy (Signed)
[facility] [address], [place], [address] Phone: [phone]   Fax:  [phone]  Occupational Therapy Treatment  Patient Details  Name: Shane Boyd. MRN: [id number] Date of Birth: October 02, 1951 Referring Provider: Olen Pel  Encounter Date: 08/08/2017      OT End of Session - 08/08/17 1402    Visit Number 3   Number of Visits 12   Date for OT Re-Evaluation 09/07/17   OT Start Time 1220   OT Stop Time 1247   OT Time Calculation (min) 27 min   Activity Tolerance Patient tolerated treatment well   Behavior During Therapy [facility] for tasks assessed/performed      Past Medical History:  Diagnosis Date  . Anemia   . Anxiety   . Chronic insomnia   . Constipation   . Depression   . Diabetes mellitus without complication ([place])   . Encounter for long-term (current) use of other high-risk medications   . GERD (gastroesophageal reflux disease)   . History of malignant melanoma    Dr. Koleen Nimrod  . History of squamous cell carcinoma   . Hx of basal cell carcinoma   . Hyperlipidemia   . Leukocytosis   . Obesity   . Other male erectile dysfunction   . Vertigo    1-2x/yr    Past Surgical History:  Procedure Laterality Date  . ARM SKIN LESION BIOPSY / EXCISION Right 05/18/2017  . COLONOSCOPY    . COLONOSCOPY WITH PROPOFOL N/A 12/15/2016   Procedure: COLONOSCOPY WITH PROPOFOL;  Surgeon: Lucilla Lame, MD;  Location: [place];  Service: Endoscopy;  Laterality: N/A;  diabetic - oral meds  . NASAL SEPTUM SURGERY    . POLYPECTOMY  12/15/2016   Procedure: POLYPECTOMY;  Surgeon: Lucilla Lame, MD;  Location: [place];  Service: Endoscopy;;  . SENTINEL NODE BIOPSY Right 05/18/2017   [facility]    There were no vitals filed for this visit.      Subjective Assessment - 08/08/17 1222    Subjective  I think my arm come down - my wife helped with the exercises for forearm and massage and she could  tell different - and washing my hands feels normal now - just seen Dr Evorn Gong this am - he did freeze one place off - I am just now scared    Patient Stated Goals Want to get the swelling down - so my hand and arm feels normal again - I am R handed    Currently in Pain? No/denies             LYMPHEDEMA/ONCOLOGY QUESTIONNAIRE - 08/08/17 1224      Right Upper Extremity Lymphedema   15 cm Proximal to Olecranon Process 32.5 cm   10 cm Proximal to Olecranon Process 31 cm   Olecranon Process 29.5 cm   15 cm Proximal to Ulnar Styloid Process 29.2 cm   10 cm Proximal to Ulnar Styloid Process 24.5 cm   Just Proximal to Ulnar Styloid Process 18.7 cm   Across Hand at [employer] 23 cm   At [place] of 2nd Digit 7.4 cm   At [facility] of Thumb 7.2 cm        Assess circumference in R UE - see flowsheet Fibrotic techniques done to volar forearm -by OT and pt ed again on doing at home and wife to help  MLD to R arm : 3. Pump across chest from right to left 8 times  4. Pump down the right side of trunk from armpit to groin 8 times 5.Pump up the outside of right upper arm 8 times, inside of upper arm to outside 8x, outside of upper arm again 8x  6.Pump across chest from R to L 8 times 7. Pump down the right side of trunk from armpit to groin 8 times 8.Pump top of forearm from wrist to elbow 8 times 9.Pump up the outside of right upper arm 8 times 10. Pump across chest from right to left 8 times 11. Pump down the right side of trunk from armpit to groin 8 times 12.Pump up the back of the forearm from wrist to elbow 8 times 13.Pump up the outside of right upper arm 8 times 14. Pump across chest from right to left 8 times 15. Pump down the right side of trunk from armpit to groin 8 times  Komprex foam cut and fitted under compression on volar forearm to decrease fibrosis Scar massage  COnt with  isotoner glove for R hand , tubi grip D for  daytime use - cut new 2 Pt report he is using his over the counter compression sleeve and isotoner glove at night time- and appear to work better- and fits better now on arm - than prior to starting therapy   Fibrosis in forearm improving - but still increase by 1.2 to 2 cm in forearm , 1.3 in elbow and distal upper arm  But pt is R hand dominant                   OT Education - 08/08/17 1401    Education provided Yes   Education Details progress, and homeprogram review again    Person(s) Educated Patient   Methods Explanation;Demonstration;Tactile cues   Comprehension Verbalized understanding;Returned demonstration          OT Short Term Goals - 07/27/17 1724      OT SHORT TERM GOAL #1   Title R UE circumference decrease from forearm to upper arm by 2 cm to be fitted with correct compression sleeve and glove    Baseline R UE increase compare to L - 1 hand , 1.3 at wrist , 2,6cm at forearm , 3.6 at elbow , 2.5 cm at upper arm    Time 3   Period Weeks   Status New   Target Date 08/17/17           OT Long Term Goals - 07/27/17 1726      OT LONG TERM GOAL #1   Title Pt to be ind in HEP for MLD, bandaging , HEP to decrease circumference to be measure for appropriate compressoin garments    Baseline no knowledge    Time 3   Period Weeks   Status New   Target Date 08/17/17     OT LONG TERM GOAL #2   Title Pt to be indepened in use of correct compression garments to maintain lymphedema in R UE    Baseline had over the counter sleeve before but did not work - no knowledge how to keep decrease lymphedema    Time 6   Period Weeks   Status New   Target Date 09/07/17               Plan - 08/08/17 1402    Clinical Impression Statement Pt's circumference decrease in forearm and wrist - and fibrosis is softer and better in forearm - pt to cont with same homeprogram and  wife to assist with fibrotic tech    Occupational performance deficits (Please refer to  evaluation for details): ADL's;IADL's;Rest and Sleep   Rehab Potential Good   OT Frequency 1x / week   OT Duration 4 weeks   OT Treatment/Interventions Self-care/ADL training;Manual lymph drainage;Patient/family education;Therapeutic exercises;Compression bandaging;Contrast Bath;Scar mobilization;Manual Therapy   Clinical Decision Making Limited treatment options, no task modification necessary   OT Home Exercise Plan see pti instruction    Consulted and Agree with Plan of Care Patient      Patient will benefit from skilled therapeutic intervention in order to improve the following deficits and impairments:  Increased edema, Impaired flexibility, Pain, Decreased scar mobility, Impaired UE functional use  Visit Diagnosis: Lymphedema, not elsewhere classified    Problem List Patient Active Problem List   Diagnosis Date Noted  . Difficulty balancing 05/16/2017  . 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 Special screening for malignant neoplasms, colon   . Benign neoplasm of ascending colon   . Benign neoplasm of transverse colon   . Hearing loss, sensorineural, unilateral 10/21/2016  . Dyslipidemia with low high density lipoprotein (HDL) cholesterol with hypertriglyceridemia due to type 2 diabetes mellitus ([place]) 09/16/2016  . GAD (generalized anxiety disorder) 09/16/2016  . Genital herpes 06/08/2016  . Dizziness 03/09/2016  . History of nonmelanoma skin cancer 09/30/2015  . Neck pain 07/29/2015  . Anxiety 07/19/2015  . Insomnia, persistent 07/19/2015  . CN (constipation) 07/19/2015  . 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 Dyslipidemia 07/19/2015  . Gastric reflux 07/19/2015  . History of shingles 07/19/2015  . History of basal cell cancer 07/19/2015  . H/O Malignant melanoma 07/19/2015  . Personal history of malignant neoplasm of head and neck 07/19/2015  . Chronic recurrent major depressive disorder ([place])  07/19/2015  . Obesity (BMI 30.0-34.9) 07/19/2015  . ED (erectile dysfunction) 07/19/2015    Rosalyn Gess OTR/L,CLT 08/08/2017, 2:08 PM  [place] [facility] [address], [place], [address] Phone: [phone]   Fax:  [phone]  Name: Shane Boyd. MRN: [id number] Date of Birth: 12-11-1950

## 2017-08-15 ENCOUNTER — Ambulatory Visit: Payer: Medicare Other | Admitting: Occupational Therapy

## 2017-08-15 DIAGNOSIS — I890 Lymphedema, not elsewhere classified: Secondary | ICD-10-CM

## 2017-08-15 NOTE — Therapy (Signed)
[place] [facility] [address], [place], [address] Phone: [phone]   Fax:  [phone]  Occupational Therapy Treatment  Patient Details  Name: Shane Boyd. MRN: [id number] Date of Birth: [birth year] Referring Provider: Olen Pel  Encounter Date: 08/15/2017      OT End of Session - 08/15/17 1135    Visit Number 4   Number of Visits 12   Date for OT Re-Evaluation 09/07/17   OT Start Time 1103   OT Stop Time 1126   OT Time Calculation (min) 23 min   Activity Tolerance Patient tolerated treatment well   Behavior During Therapy [facility] for tasks assessed/performed      Past Medical History:  Diagnosis Date  . Anemia   . Anxiety   . Chronic insomnia   . Constipation   . Depression   . Diabetes mellitus without complication ([place])   . Encounter for long-term (current) use of other high-risk medications   . GERD (gastroesophageal reflux disease)   . History of malignant melanoma    Dr. Koleen Nimrod  . History of squamous cell carcinoma   . Hx of basal cell carcinoma   . Hyperlipidemia   . Leukocytosis   . Obesity   . Other male erectile dysfunction   . Vertigo    1-2x/yr    Past Surgical History:  Procedure Laterality Date  . ARM SKIN LESION BIOPSY / EXCISION Right 05/18/2017  . COLONOSCOPY    . COLONOSCOPY WITH PROPOFOL N/A 12/15/2016   Procedure: COLONOSCOPY WITH PROPOFOL;  Surgeon: Lucilla Lame, MD;  Location: [place];  Service: Endoscopy;  Laterality: N/A;  diabetic - oral meds  . NASAL SEPTUM SURGERY    . POLYPECTOMY  12/15/2016   Procedure: POLYPECTOMY;  Surgeon: Lucilla Lame, MD;  Location: [place];  Service: Endoscopy;;  . SENTINEL NODE BIOPSY Right 05/18/2017   [facility]    There were no vitals filed for this visit.      Subjective Assessment - 08/15/17 1109    Subjective  I slept with the sleeve every night - and wear duing day if I did somethings- massage done once day -  could not tell big difference from last time    Patient Stated Goals Want to get the swelling down - so my hand and arm feels normal again - I am R handed    Currently in Pain? No/denies             LYMPHEDEMA/ONCOLOGY QUESTIONNAIRE - 08/15/17 1111      Right Upper Extremity Lymphedema   15 cm Proximal to Olecranon Process 32.5 cm   10 cm Proximal to Olecranon Process 30.8 cm   Olecranon Process 28.5 cm   15 cm Proximal to Ulnar Styloid Process 29 cm   10 cm Proximal to Ulnar Styloid Process 24.4 cm   Just Proximal to Ulnar Styloid Process 18.5 cm   Across Hand at [employer] 23 cm   At [place] of 2nd Digit 7.4 cm   At [facility] of Thumb 7.2 cm      Assess circumference in R UE - see flowsheet Fibrotic techniques done to volar forearm and MLD to hand and digits and forearm   pt to have wife help with that   COnt to do   MLD to R arm : 3. Pump across chest from right to left 8 times 4. Pump down the right side of trunk from armpit to groin 8 times  5.Pump up the outside of right upper arm 8 times, inside of upper arm to outside 8x, outside of upper arm again 8x  6.Pump across chest from R to L 8 times 7. Pump down the right side of trunk from armpit to groin 8 times 8.Pump top of forearm from wrist to elbow 8 times 9.Pump up the outside of right upper arm 8 times 10. Pump across chest from right to left 8 times 11. Pump down the right side of trunk from armpit to groin 8 times 12.Pump up the back of the forearm from wrist to elbow 8 times 13.Pump up the outside of right upper arm 8 times 14. Pump across chest from right to left 8 times 15. Pump down the right side of trunk from armpit to groin 8 times   COnt with  isotoner glove for R hand ,provided small for pt   but to order compression glove to match the compression sleeve her ordered online  Pt report he is using his over the counter compression sleeve and  isotoner glove at night time- and appear to work  and fits better now on arm - than prior to starting therapy    R hand dominant                      OT Education - 08/15/17 1134    Education provided Yes   Education Details HEP for sleeve wearing and MLD -focus on hand   Person(s) Educated Patient   Methods Explanation;Demonstration;Tactile cues;Verbal cues   Comprehension Returned demonstration;Verbalized understanding          OT Short Term Goals - 07/27/17 1724      OT SHORT TERM GOAL #1   Title R UE circumference decrease from forearm to upper arm by 2 cm to be fitted with correct compression sleeve and glove    Baseline R UE increase compare to L - 1 hand , 1.3 at wrist , 2,6cm at forearm , 3.6 at elbow , 2.5 cm at upper arm    Time 3   Period Weeks   Status New   Target Date 08/17/17           OT Long Term Goals - 07/27/17 1726      OT LONG TERM GOAL #1   Title Pt to be ind in HEP for MLD, bandaging , HEP to decrease circumference to be measure for appropriate compressoin garments    Baseline no knowledge    Time 3   Period Weeks   Status New   Target Date 08/17/17     OT LONG TERM GOAL #2   Title Pt to be indepened in use of correct compression garments to maintain lymphedema in R UE    Baseline had over the counter sleeve before but did not work - no knowledge how to keep decrease lymphedema    Time 6   Period Weeks   Status New   Target Date 09/07/17               Plan - 08/15/17 1135    Clinical Impression Statement Pt R UE circumference cont to decrease - with wearing of compression and doing MLD- pt MC's of hand still increase , proximal forearm and distal upper arm compare to L - pt to order compression glove to match compression of his sleeve - and wear during day with act and night time as needed - and will reassess in 2 wks this  time - also do MLD to hand    Occupational performance deficits (Please refer to evaluation for  details): ADL's;IADL's;Rest and Sleep   Rehab Potential Good   OT Frequency Biweekly   OT Duration 4 weeks   OT Treatment/Interventions Self-care/ADL training;Manual lymph drainage;Patient/family education;Therapeutic exercises;Compression bandaging;Contrast Bath;Scar mobilization;Manual Therapy   Plan Will reassess circumference and wearing of compression in 2 wks    Clinical Decision Making Limited treatment options, no task modification necessary   OT Home Exercise Plan see pti instruction    Consulted and Agree with Plan of Care Patient      Patient will benefit from skilled therapeutic intervention in order to improve the following deficits and impairments:  Increased edema, Impaired flexibility, Pain, Decreased scar mobility, Impaired UE functional use  Visit Diagnosis: Lymphedema, not elsewhere classified    Problem List Patient Active Problem List   Diagnosis Date Noted  . Difficulty balancing 05/16/2017  . 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 Special screening for malignant neoplasms, colon   . Benign neoplasm of ascending colon   . Benign neoplasm of transverse colon   . Hearing loss, sensorineural, unilateral 10/21/2016  . Dyslipidemia with low high density lipoprotein (HDL) cholesterol with hypertriglyceridemia due to type 2 diabetes mellitus ([place]) 09/16/2016  . GAD (generalized anxiety disorder) 09/16/2016  . Genital herpes 06/08/2016  . Dizziness 03/09/2016  . History of nonmelanoma skin cancer 09/30/2015  . Neck pain 07/29/2015  . Anxiety 07/19/2015  . Insomnia, persistent 07/19/2015  . CN (constipation) 07/19/2015  . 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 Dyslipidemia 07/19/2015  . Gastric reflux 07/19/2015  . History of shingles 07/19/2015  . History of basal cell cancer 07/19/2015  . H/O Malignant melanoma 07/19/2015  . Personal history of malignant neoplasm of head and neck  07/19/2015  . Chronic recurrent major depressive disorder ([place]) 07/19/2015  . Obesity (BMI 30.0-34.9) 07/19/2015  . ED (erectile dysfunction) 07/19/2015    Rosalyn Gess  OTR/L,CLT  08/15/2017, 11:38 AM  [place] [facility] [address], [place], [address] Phone: [phone]   Fax:  [phone]  Name: Salomon Ganser. MRN: [id number] Date of Birth: 24-Mar-1951

## 2017-08-15 NOTE — Patient Instructions (Signed)
Same HEP -but focus on massage of hand - and order glove that match the arm sleeve in compression  isotoner gloves compresssion is less

## 2017-08-24 ENCOUNTER — Encounter: Payer: Self-pay | Admitting: Family Medicine

## 2017-08-25 ENCOUNTER — Other Ambulatory Visit: Payer: Self-pay | Admitting: Family Medicine

## 2017-08-25 DIAGNOSIS — K219 Gastro-esophageal reflux disease without esophagitis: Secondary | ICD-10-CM

## 2017-08-25 MED ORDER — OMEPRAZOLE 40 MG PO CPDR: 40.0000 mg | DELAYED_RELEASE_CAPSULE | Freq: Every day | ORAL | 1 refills | Status: DC

## 2017-08-29 ENCOUNTER — Ambulatory Visit: Payer: Medicare Other | Admitting: Occupational Therapy

## 2017-08-29 DIAGNOSIS — I890 Lymphedema, not elsewhere classified: Secondary | ICD-10-CM

## 2017-08-29 NOTE — Therapy (Signed)
[place] [facility] [address], [place], [address] Phone: [phone]   Fax:  [phone]  Occupational Therapy Treatment  Patient Details  Name: Shane Boyd. MRN: [id number] Date of Birth: 1951/04/26 Referring Provider: Olen Pel  Encounter Date: 08/29/2017      OT End of Session - 08/29/17 1127    Visit Number 5   Number of Visits 12   Date for OT Re-Evaluation 09/07/17   OT Start Time 1045   OT Stop Time 1114   OT Time Calculation (min) 29 min   Activity Tolerance Patient tolerated treatment well   Behavior During Therapy [facility] for tasks assessed/performed      Past Medical History:  Diagnosis Date  . Anemia   . Anxiety   . Chronic insomnia   . Constipation   . Depression   . Diabetes mellitus without complication ([place])   . Encounter for long-term (current) use of other high-risk medications   . GERD (gastroesophageal reflux disease)   . History of malignant melanoma    Dr. Koleen Nimrod  . History of squamous cell carcinoma   . Hx of basal cell carcinoma   . Hyperlipidemia   . Leukocytosis   . Obesity   . Other male erectile dysfunction   . Vertigo    1-2x/yr    Past Surgical History:  Procedure Laterality Date  . ARM SKIN LESION BIOPSY / EXCISION Right 05/18/2017  . COLONOSCOPY    . COLONOSCOPY WITH PROPOFOL N/A 12/15/2016   Procedure: COLONOSCOPY WITH PROPOFOL;  Surgeon: Lucilla Lame, MD;  Location: [place];  Service: Endoscopy;  Laterality: N/A;  diabetic - oral meds  . NASAL SEPTUM SURGERY    . POLYPECTOMY  12/15/2016   Procedure: POLYPECTOMY;  Surgeon: Lucilla Lame, MD;  Location: [place];  Service: Endoscopy;;  . SENTINEL NODE BIOPSY Right 05/18/2017   [facility]    There were no vitals filed for this visit.      Subjective Assessment - 08/29/17 1122    Subjective  My arm and hand feels good - no pain , good strength , no tightness - did wear the sleeve about 5 hrs  during day and at night time - and did get gauntlet for my hand for daytime - night time isotoner glove    Patient Stated Goals Want to get the swelling down - so my hand and arm feels normal again - I am R handed    Currently in Pain? No/denies             LYMPHEDEMA/ONCOLOGY QUESTIONNAIRE - 08/29/17 1051      Right Upper Extremity Lymphedema   15 cm Proximal to Olecranon Process 31.8 cm   10 cm Proximal to Olecranon Process 30.7 cm   Olecranon Process 28.5 cm   15 cm Proximal to Ulnar Styloid Process 29 cm   10 cm Proximal to Ulnar Styloid Process 24.8 cm   Just Proximal to Ulnar Styloid Process 18.7 cm   Across Hand at [employer] 23 cm   At [place] of 2nd Digit 7.4 cm   At [facility] of Thumb 7 cm      pt arrive with compression sleeve and gauntlet on -report was wearing it daytime about 5 hrs and night time with isotoner glove  MEasure circumference and compare  Assess arm and for fibrosis  - some in distal upper arm - at scar   compare to L side and did some  fibrotic tech - pt to do at home  Did mostly forearm before - great results after done by OT this date  Pt to wean daytime compression sleeve 3-6 days and measure arm - compare to today  If good can wean out of night time and do same thing 3-6 days measure  Will reassess R UE in 2 wks  Pt is  R hand dominant                    OT Education - 08/29/17 1127    Education provided Yes   Education Details wearing of sleeves and MLD    Person(s) Educated Patient   Methods Explanation;Demonstration;Tactile cues   Comprehension Returned demonstration;Verbalized understanding          OT Short Term Goals - 08/29/17 1135      OT SHORT TERM GOAL #1   Title R UE circumference decrease from forearm to upper arm by 2 cm to be fitted with correct compression sleeve and glove    Baseline R UE increase compare to L - 1 hand , 1.3 at wrist , 2,6cm at forearm , 3.6 at elbow , 2.5 cm at upper arm - decrease from  hand to upper arm see flowsheet    Time 3   Period Weeks   Status On-going   Target Date 09/19/17           OT Long Term Goals - 08/29/17 1136      OT LONG TERM GOAL #1   Title Pt to be ind in HEP for MLD, bandaging , HEP to decrease circumference to be measure for appropriate compressoin garments    Status Achieved     OT LONG TERM GOAL #2   Title Pt to be indepened in use of correct compression garments to maintain lymphedema in R UE    Baseline had over the counter sleeve - - decrease - assessing now if need comrpession or can wean out of it    Time 4   Period Weeks   Status On-going   Target Date 09/12/17               Plan - 08/29/17 1127    Clinical Impression Statement Pt R UE circumference decrease in upper arm - pt still increase in forearm , elbow and upper arm - but is R hand dominant  - pt to initiate wearing sleeve less- daytime first and measure every 3-6 days - if doing okay - can decrease night time wearing - cont iwth MLD and will reassess in 2 wks     Occupational performance deficits (Please refer to evaluation for details): ADL's;IADL's;Rest and Sleep   Rehab Potential Good   OT Frequency Biweekly   OT Duration 4 weeks   OT Treatment/Interventions Self-care/ADL training;Manual lymph drainage;Patient/family education;Therapeutic exercises;Compression bandaging;Contrast Bath;Scar mobilization;Manual Therapy   Plan reassess R UE and assess how he is doing weaning out of compression sleeve    Clinical Decision Making Limited treatment options, no task modification necessary   OT Home Exercise Plan see pti instruction    Consulted and Agree with Plan of Care Patient      Patient will benefit from skilled therapeutic intervention in order to improve the following deficits and impairments:  Increased edema, Impaired flexibility, Pain, Decreased scar mobility, Impaired UE functional use  Visit Diagnosis: Lymphedema, not elsewhere  classified    Problem List Patient Active Problem List   Diagnosis Date Noted  . Difficulty balancing  05/16/2017  . 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 Special screening for malignant neoplasms, colon   . Benign neoplasm of ascending colon   . Benign neoplasm of transverse colon   . Hearing loss, sensorineural, unilateral 10/21/2016  . Dyslipidemia with low high density lipoprotein (HDL) cholesterol with hypertriglyceridemia due to type 2 diabetes mellitus ([place]) 09/16/2016  . GAD (generalized anxiety disorder) 09/16/2016  . Genital herpes 06/08/2016  . Dizziness 03/09/2016  . History of nonmelanoma skin cancer 09/30/2015  . Neck pain 07/29/2015  . Anxiety 07/19/2015  . Insomnia, persistent 07/19/2015  . CN (constipation) 07/19/2015  . 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 Dyslipidemia 07/19/2015  . Gastric reflux 07/19/2015  . History of shingles 07/19/2015  . History of basal cell cancer 07/19/2015  . H/O Malignant melanoma 07/19/2015  . Personal history of malignant neoplasm of head and neck 07/19/2015  . Chronic recurrent major depressive disorder ([place]) 07/19/2015  . Obesity (BMI 30.0-34.9) 07/19/2015  . ED (erectile dysfunction) 07/19/2015    Rosalyn Gess OTR/L,CLT  08/29/2017, 11:37 AM  [place] [address], [place], [address] Phone: [phone]   Fax:  [phone]  Name: Estevon Fluke. MRN: [id number] Date of Birth: 09-16-1951

## 2017-08-29 NOTE — Patient Instructions (Signed)
Stop wearing sleeve during day for 3 days and then 6 days - measure arm and compare  Then if doing good and no increase  Can discharge night time sleeve - and remeasure again 3 days and 6 days  If no increase cont cont to go without sleeves - will follow up in 2 wks  If increase cont wearing  Cont with MLD and fibrotic tech to distal upper arm around scar

## 2017-09-12 ENCOUNTER — Ambulatory Visit: Payer: Medicare Other | Attending: Surgery | Admitting: Occupational Therapy

## 2017-09-12 DIAGNOSIS — I890 Lymphedema, not elsewhere classified: Secondary | ICD-10-CM | POA: Insufficient documentation

## 2017-09-12 NOTE — Therapy (Signed)
[facility] [address], [place], [address] Phone: [phone]   Fax:  [phone]  Occupational Therapy Treatment  Patient Details  Name: Shane Boyd. MRN: [id number] Date of Birth: June 25, 1951 Referring Provider: Olen Pel  Encounter Date: 09/12/2017      OT End of Session - 09/12/17 1031    Visit Number 6   Number of Visits 12   Date for OT Re-Evaluation 10/10/17   OT Start Time 1015   OT Stop Time 1031   OT Time Calculation (min) 16 min   Activity Tolerance Patient tolerated treatment well   Behavior During Therapy [facility] for tasks assessed/performed      Past Medical History:  Diagnosis Date  . Anemia   . Anxiety   . Chronic insomnia   . Constipation   . Depression   . Diabetes mellitus without complication ([place])   . Encounter for long-term (current) use of other high-risk medications   . GERD (gastroesophageal reflux disease)   . History of malignant melanoma    Dr. Koleen Nimrod  . History of squamous cell carcinoma   . Hx of basal cell carcinoma   . Hyperlipidemia   . Leukocytosis   . Obesity   . Other male erectile dysfunction   . Vertigo    1-2x/yr    Past Surgical History:  Procedure Laterality Date  . ARM SKIN LESION BIOPSY / EXCISION Right 05/18/2017  . COLONOSCOPY    . COLONOSCOPY WITH PROPOFOL N/A 12/15/2016   Procedure: COLONOSCOPY WITH PROPOFOL;  Surgeon: Lucilla Lame, MD;  Location: [facility];  Service: Endoscopy;  Laterality: N/A;  diabetic - oral meds  . NASAL SEPTUM SURGERY    . POLYPECTOMY  12/15/2016   Procedure: POLYPECTOMY;  Surgeon: Lucilla Lame, MD;  Location: [facility];  Service: Endoscopy;;  . SENTINEL NODE BIOPSY Right 05/18/2017   [facility]    There were no vitals filed for this visit.      Subjective Assessment - 09/12/17 1030    Subjective  My arm feels good -I don't feel anything anymore that reminds me that I had surgery - I do wear the  sleeve at night time still with glove    Patient Stated Goals Want to get the swelling down - so my hand and arm feels normal again - I am R handed    Currently in Pain? No/denies             LYMPHEDEMA/ONCOLOGY QUESTIONNAIRE - 09/12/17 1018      Right Upper Extremity Lymphedema   15 cm Proximal to Olecranon Process 31.5 cm   10 cm Proximal to Olecranon Process 30.1 cm   Olecranon Process 29 cm   15 cm Proximal to Ulnar Styloid Process 29.1 cm   10 cm Proximal to Ulnar Styloid Process 25 cm   Just Proximal to Ulnar Styloid Process 19 cm   Across Hand at [employer] 23.3 cm   At [place] of 2nd Digit 7.5 cm   At [facility] of Thumb 7.3 cm     Left Upper Extremity Lymphedema   15 cm Proximal to Olecranon Process 31.7 cm   10 cm Proximal to Olecranon Process 28.7 cm   Olecranon Process 28 cm   15 cm Proximal to Ulnar Styloid Process 27.5 cm   10 cm Proximal to Ulnar Styloid Process 23.3 cm   Just Proximal to Ulnar Styloid Process 17.5 cm      Pt arrive  with compression sleeve and gauntlet on  Pt report been very busy doing his deck   Arm feels more normal - no tightness or weakness  measure circumference  Pt to stop wearing compression for month Ed pt on assessing arm for tightness, fullness or fibrosis - if feels after act - maybe need to wear sleeve for that act - otherwise phone me in month                    OT Education - 09/12/17 1031    Education provided Yes   Education Details assessing tissue prior and after act - stop wearing compression    Person(s) Educated Patient   Methods Explanation;Demonstration;Tactile cues;Verbal cues   Comprehension Verbal cues required;Returned demonstration;Verbalized understanding          OT Short Term Goals - 09/12/17 1036      OT SHORT TERM GOAL #1   Title R UE circumference decrease from forearm to upper arm by 2 cm to be fitted with correct compression sleeve and glove    Baseline did decrease from [facility] - see  flowsheet    Period Weeks   Status Achieved           OT Long Term Goals - 09/12/17 1037      OT LONG TERM GOAL #1   Title Pt to be ind in HEP for MLD, bandaging , HEP to decrease circumference to be measure for appropriate compressoin garments    Status Achieved     OT LONG TERM GOAL #2   Title Pt to be indepened in use of correct compression garments to maintain lymphedema in R UE    Baseline did wear compression up to today - pt to stop and assess if need still for phyical act    Time 4   Period Weeks   Status On-going   Target Date 10/10/17               Plan - 09/12/17 1032    Clinical Impression Statement Pt R UE circumference assesse and compare - forearm went up less than .5 but pt it more physical working on his deck - and compare to L - pt still increase less than 2 cm and R hand dominant - pt to stop wearing compression for month - and  assess tissue of forearm prior and after physical act - if tigher or fuller maybe wear compression for that act - otherwis pt to phone OT in month for reassessment    Occupational performance deficits (Please refer to evaluation for details): ADL's;IADL's;Rest and Sleep   Rehab Potential Good   OT Frequency Monthly   OT Duration 4 weeks   OT Treatment/Interventions Self-care/ADL training;Manual lymph drainage;Patient/family education;Therapeutic exercises;Compression bandaging;Contrast Bath;Scar mobilization;Manual Therapy   Plan pt to phone in month for reassessment    Clinical Decision Making Limited treatment options, no task modification necessary   OT Home Exercise Plan see pti instruction    Consulted and Agree with Plan of Care Patient      Patient will benefit from skilled therapeutic intervention in order to improve the following deficits and impairments:  Increased edema, Impaired flexibility, Pain, Decreased scar mobility, Impaired UE functional use  Visit Diagnosis: Lymphedema, not elsewhere classified - Plan: Ot  plan of care cert/re-cert    Problem List Patient Active Problem List   Diagnosis Date Noted  . Difficulty balancing 05/16/2017  . 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  Special screening for malignant neoplasms, colon   . Benign neoplasm of ascending colon   . Benign neoplasm of transverse colon   . Hearing loss, sensorineural, unilateral 10/21/2016  . Dyslipidemia with low high density lipoprotein (HDL) cholesterol with hypertriglyceridemia due to type 2 diabetes mellitus ([place]) 09/16/2016  . GAD (generalized anxiety disorder) 09/16/2016  . Genital herpes 06/08/2016  . Dizziness 03/09/2016  . History of nonmelanoma skin cancer 09/30/2015  . Neck pain 07/29/2015  . Anxiety 07/19/2015  . Insomnia, persistent 07/19/2015  . CN (constipation) 07/19/2015  . 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 Dyslipidemia 07/19/2015  . Gastric reflux 07/19/2015  . History of shingles 07/19/2015  . History of basal cell cancer 07/19/2015  . H/O Malignant melanoma 07/19/2015  . Personal history of malignant neoplasm of head and neck 07/19/2015  . Chronic recurrent major depressive disorder ([place]) 07/19/2015  . Obesity (BMI 30.0-34.9) 07/19/2015  . ED (erectile dysfunction) 07/19/2015    Rosalyn Gess OTR/L,CLT 09/12/2017, 10:38 AM  [facility] [address], [place], [address] Phone: [phone]   Fax:  [phone]  Name: Shane Boyd. MRN: [id number] Date of Birth: 1951-04-03

## 2017-09-12 NOTE — Patient Instructions (Signed)
Pt to stop wearing compression  Ed on assessing arm tissue prior and after physical act  If tighter , fuller - can wear daytime compression sleeve with that act - otherwise can stop wearing compression

## 2017-10-04 ENCOUNTER — Encounter: Payer: Self-pay | Admitting: Family Medicine

## 2017-10-04 ENCOUNTER — Ambulatory Visit (INDEPENDENT_AMBULATORY_CARE_PROVIDER_SITE_OTHER): Payer: Medicare Other | Admitting: Family Medicine

## 2017-10-04 VITALS — BP 100/60 | HR 80 | Resp 14 | Ht 69.0 in | Wt 219.3 lb

## 2017-10-04 DIAGNOSIS — N521 Erectile dysfunction due to diseases classified elsewhere: Secondary | ICD-10-CM

## 2017-10-04 DIAGNOSIS — S61211A Laceration without foreign body of left index finger without damage to nail, initial encounter: Secondary | ICD-10-CM

## 2017-10-04 DIAGNOSIS — F411 Generalized anxiety disorder: Secondary | ICD-10-CM

## 2017-10-04 DIAGNOSIS — E1169 Type 2 diabetes mellitus with other specified complication: Secondary | ICD-10-CM | POA: Diagnosis not present

## 2017-10-04 DIAGNOSIS — F339 Major depressive disorder, recurrent, unspecified: Secondary | ICD-10-CM

## 2017-10-04 DIAGNOSIS — E1140 Type 2 diabetes mellitus with diabetic neuropathy, unspecified: Secondary | ICD-10-CM | POA: Diagnosis not present

## 2017-10-04 DIAGNOSIS — E785 Hyperlipidemia, unspecified: Secondary | ICD-10-CM

## 2017-10-04 DIAGNOSIS — E782 Mixed hyperlipidemia: Secondary | ICD-10-CM

## 2017-10-04 DIAGNOSIS — Z23 Encounter for immunization: Secondary | ICD-10-CM

## 2017-10-04 LAB — POCT GLYCOSYLATED HEMOGLOBIN (HGB A1C): Hemoglobin A1C: 6.5

## 2017-10-04 MED ORDER — OMEGA-3-ACID ETHYL ESTERS 1 G PO CAPS
2.0000 g | ORAL_CAPSULE | Freq: Two times a day (BID) | ORAL | 1 refills | Status: DC
Start: 2017-10-04 — End: 2018-04-06

## 2017-10-04 MED ORDER — AMPHETAMINE-DEXTROAMPHETAMINE 15 MG PO TABS: 15.0000 mg | ORAL_TABLET | Freq: Two times a day (BID) | ORAL | 0 refills | Status: DC

## 2017-10-04 MED ORDER — HYDROXYZINE HCL 25 MG PO TABS: 25.0000 mg | ORAL_TABLET | Freq: Three times a day (TID) | ORAL | 0 refills | Status: DC | PRN

## 2017-10-04 MED ORDER — ATORVASTATIN CALCIUM 80 MG PO TABS: 80.0000 mg | ORAL_TABLET | Freq: Every day | ORAL | 1 refills | Status: DC

## 2017-10-04 MED ORDER — ALPRAZOLAM 1 MG PO TABS: 0.5000 mg | ORAL_TABLET | Freq: Every evening | ORAL | 2 refills | Status: DC | PRN

## 2017-10-04 MED ORDER — AMPHETAMINE-DEXTROAMPHETAMINE 15 MG PO TABS
15.0000 mg | ORAL_TABLET | Freq: Two times a day (BID) | ORAL | 0 refills | Status: DC
Start: 2017-10-04 — End: 2018-01-03

## 2017-10-04 MED ORDER — BUPROPION HCL ER (XL) 300 MG PO TB24: 300.0000 mg | ORAL_TABLET | Freq: Every day | ORAL | 1 refills | Status: DC
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
## 2017-10-22 ENCOUNTER — Other Ambulatory Visit: Payer: Self-pay | Admitting: Family Medicine

## 2017-10-22 DIAGNOSIS — A6000 Herpesviral infection of urogenital system, unspecified: Secondary | ICD-10-CM

## 2017-10-24 ENCOUNTER — Other Ambulatory Visit (HOSPITAL_COMMUNITY): Payer: Self-pay

## 2017-10-24 ENCOUNTER — Encounter: Payer: Self-pay | Admitting: Family Medicine

## 2017-10-24 DIAGNOSIS — A6000 Herpesviral infection of urogenital system, unspecified: Secondary | ICD-10-CM

## 2017-10-24 NOTE — Telephone Encounter (Signed)
Refill request for general medication: Valacyclovir 500 mg  Last office visit: 10/04/2017  Last physical exam: 10/11/2016  Follow up visit: 01/03/2018

## 2017-10-25 MED ORDER — VALACYCLOVIR HCL 500 MG PO TABS
ORAL_TABLET | ORAL | 1 refills | Status: DC
Start: 2017-10-25 — End: 2018-11-10

## 2017-11-02 ENCOUNTER — Other Ambulatory Visit: Payer: Self-pay | Admitting: Family Medicine

## 2017-11-02 NOTE — Telephone Encounter (Signed)
Refill request for general medication: Tizanidine 4 mg  Last office visit: 10/04/2017  Last physical exam: 10/11/2016  Follow up visit: 01/03/2018

## 2017-12-19 ENCOUNTER — Encounter: Payer: Self-pay | Admitting: Family Medicine

## 2018-01-03 ENCOUNTER — Ambulatory Visit: Payer: Medicare Other | Admitting: Family Medicine

## 2018-01-03 ENCOUNTER — Encounter: Payer: Self-pay | Admitting: Family Medicine

## 2018-01-03 VITALS — BP 110/62 | HR 105 | Resp 16 | Ht 69.0 in | Wt 217.7 lb

## 2018-01-03 DIAGNOSIS — E782 Mixed hyperlipidemia: Secondary | ICD-10-CM | POA: Diagnosis not present

## 2018-01-03 DIAGNOSIS — R42 Dizziness and giddiness: Secondary | ICD-10-CM | POA: Diagnosis not present

## 2018-01-03 DIAGNOSIS — N521 Erectile dysfunction due to diseases classified elsewhere: Secondary | ICD-10-CM | POA: Diagnosis not present

## 2018-01-03 DIAGNOSIS — E1140 Type 2 diabetes mellitus with diabetic neuropathy, unspecified: Secondary | ICD-10-CM

## 2018-01-03 DIAGNOSIS — E1169 Type 2 diabetes mellitus with other specified complication: Secondary | ICD-10-CM | POA: Diagnosis not present

## 2018-01-03 DIAGNOSIS — F339 Major depressive disorder, recurrent, unspecified: Secondary | ICD-10-CM | POA: Diagnosis not present

## 2018-01-03 DIAGNOSIS — H9193 Unspecified hearing loss, bilateral: Secondary | ICD-10-CM

## 2018-01-03 DIAGNOSIS — K219 Gastro-esophageal reflux disease without esophagitis: Secondary | ICD-10-CM | POA: Diagnosis not present

## 2018-01-03 DIAGNOSIS — F411 Generalized anxiety disorder: Secondary | ICD-10-CM

## 2018-01-03 LAB — POCT GLYCOSYLATED HEMOGLOBIN (HGB A1C): Hemoglobin A1C: 6.7

## 2018-01-03 MED ORDER — METFORMIN HCL 500 MG PO TABS: 500.0000 mg | ORAL_TABLET | Freq: Two times a day (BID) | ORAL | 1 refills | Status: DC

## 2018-01-03 MED ORDER — AMPHETAMINE-DEXTROAMPHETAMINE 15 MG PO TABS: 15.0000 mg | ORAL_TABLET | Freq: Two times a day (BID) | ORAL | 0 refills | Status: DC

## 2018-01-03 MED ORDER — ALPRAZOLAM 1 MG PO TABS: 0.5000 mg | ORAL_TABLET | Freq: Every evening | ORAL | 0 refills | Status: DC | PRN

## 2018-01-03 MED ORDER — OMEPRAZOLE 40 MG PO CPDR: 40.0000 mg | DELAYED_RELEASE_CAPSULE | Freq: Every day | ORAL | 1 refills | Status: DC

## 2018-01-03 NOTE — Progress Notes (Signed)
Name: Shane Boyd.   MRN: [id number]    DOB: [birth year]   Date:01/03/2018       Progress Note  Subjective  Chief Complaint  Chief Complaint  Patient presents with  . Diabetes  . 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 He has ED . His hgbA1C has been at goal, but trending up at 6.7% today, he has microalbuminuria also- he is on low dose ace. Dose decreased by Dr. Melrose Nakayama because of dizziness  ED: taking generic Viagra and is doing well, denies side effects of medication.   Major Depressive Disorder: he is doing better now, he was in survival mode when getting treated for Merkel skin cancer, and feels grateful now that he is doing well. Hestopped Celexa months ago because of side effects. Denies suicidal ideation. Her has not adjusted to his wife being home. He is doing better, happy about the hear phones he has been use.   GAD: discussed importance of going down on BZD dose. He still feels anxious, denies recent panic attack. He is down to about 25 pills monthly. We will adjust amount of pills, from 30 pills to 25 pills per month, he does not like Atarax, but weaning self off , we will try Alprazolam 1 mg to take prn and #45 pills to last 3 months from now on, goal of 30 for 90 days.   GERD:  He  is aware of long term use of PPI, he is now taking omeprazole in am and taking Ranitidine at night and is working well for him . He still has eructation, but no pain or heartburn.  Dyslipidemia: taking statin therapy and denies myalgia. No chest pain. He is on Lovaza now. Denies side effects. Last labs reviewed, triglycerides elevated, HDL was low. We will recheck labs on his next visit   Hearing loss: he was advised to have hearing aid, but he decided to by a [place] hear phones and he likes it   Patient Active Problem List   Diagnosis Date Noted  . Difficulty balancing 05/16/2017  . 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 Special screening for malignant neoplasms, colon   . Benign neoplasm of ascending colon   . Benign neoplasm of transverse colon   . Hearing loss, sensorineural, unilateral 10/21/2016  . 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 GAD (generalized anxiety disorder) 09/16/2016  . Genital herpes 06/08/2016  . Dizziness 03/09/2016  . History of nonmelanoma skin cancer 09/30/2015  . Neck pain 07/29/2015  . Anxiety 07/19/2015  . Insomnia, persistent 07/19/2015  . CN (constipation) 07/19/2015  . 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 Dyslipidemia 07/19/2015  . Gastric reflux 07/19/2015  . History of shingles 07/19/2015  . History of basal cell cancer 07/19/2015  . H/O Malignant melanoma 07/19/2015  . Personal history of malignant neoplasm of head and neck 07/19/2015  . Chronic recurrent major depressive disorder ([place]) 07/19/2015  . Obesity (BMI 30.0-34.9) 07/19/2015  . ED (erectile dysfunction) 07/19/2015    Past Surgical History:  Procedure Laterality Date  . ARM SKIN LESION BIOPSY / EXCISION Right 05/18/2017  . COLONOSCOPY    . COLONOSCOPY WITH PROPOFOL N/A 12/15/2016   Procedure: COLONOSCOPY WITH PROPOFOL;  Surgeon: Lucilla Lame, MD;  Location: [facility];  Service: Endoscopy;  Laterality: N/A;  diabetic - oral meds  . NASAL SEPTUM SURGERY    . POLYPECTOMY  12/15/2016   Procedure: POLYPECTOMY;  Surgeon: Lucilla Lame, MD;  Location: [place];  Service: Endoscopy;;  . SENTINEL NODE BIOPSY Right 05/18/2017   [facility]    Family History  Problem Relation Age of Onset  . Diabetes Mother   . Heart disease Mother   . Hyperlipidemia Mother   . CVA Father   . Cancer Father        Colon  . Depression Father     Social History   Socioeconomic History  . Marital status:  Married    Spouse name: Not on file  . Number of children: Not on file  . Years of education: Not on file  . Highest education level: Not on file  Social Needs  . Financial resource strain: Not on file  . Food insecurity - worry: Not on file  . Food insecurity - inability: Not on file  . Transportation needs - medical: Not on file  . Transportation needs - non-medical: Not on file  Occupational History  . Occupation: retired  Tobacco Use  . Smoking status: Never Smoker  . Smokeless tobacco: Never Used  Substance and Sexual Activity  . Alcohol use: Yes    Alcohol/week: 0.0 oz    Comment: Holidays  . Drug use: No  . Sexual activity: Yes    Partners: Female  Other Topics Concern  . Not on file  Social History Narrative   One son: [name] lives in [place]   One son Trip , died from possible suicide while living in [place]     Current Outpatient Medications:  .  ALPRAZolam (XANAX) 1 MG tablet, Take 0.5-1 tablets (0.5-1 mg total) by mouth at bedtime as needed for anxiety., Disp: 25 tablet, Rfl: 2 .  amphetamine-dextroamphetamine (ADDERALL) 15 MG tablet, Take 1 tablet by mouth 2 (two) times daily., Disp: 60 tablet, Rfl: 0 .  amphetamine-dextroamphetamine (ADDERALL) 15 MG tablet, Take 1 tablet by mouth 2 (two) times daily. Fill 11/03/2017, Disp: 60 tablet, Rfl: 0 .  amphetamine-dextroamphetamine (ADDERALL) 15 MG tablet, Take 1 tablet by mouth 2 (two) times daily. Fill 12/03/2017, Disp: 60 tablet, Rfl: 0 .  aspirin EC 81 MG tablet, Take 1 tablet (81 mg total) by mouth daily., Disp: 30 tablet, Rfl: 0 .  atorvastatin (LIPITOR) 80 MG tablet, Take 1 tablet (80 mg total) by mouth daily., Disp: 90 tablet, Rfl: 1 .  buPROPion (WELLBUTRIN XL) 300 MG 24 hr tablet, Take 1 tablet (300 mg total) by mouth daily., Disp: 90 tablet, Rfl: 1 .  docusate sodium (COLACE) 100 MG capsule, Take 1 capsule (100 mg total) by mouth 2 (two) times daily., Disp: 30 capsule, Rfl: 0 .  fluoruracil (CARAC) 0.5 % cream,  Apply to affected areas of scalp, foreheard and temples twice dialy for 4-5 days., Disp: , Rfl: 0 .  lisinopril (PRINIVIL,ZESTRIL) 5 MG tablet, Take 5 mg at night., Disp: , Rfl:  .  metFORMIN (GLUCOPHAGE) 500 MG tablet, TAKE ONE TABLET BY MOUTH TWICE DAILY, Disp: 180 tablet, Rfl: 1 .  nortriptyline (PAMELOR) 10 MG capsule, Take 2 capsules by mouth at bedtime., Disp: , Rfl:  .  omega-3 acid ethyl esters (LOVAZA) 1 g capsule, Take 2 capsules (2 g total) by mouth 2 (two) times daily., Disp: 480 capsule, Rfl: 1 .  omeprazole (PRILOSEC) 40 MG capsule, Take 1 capsule (40 mg total) by mouth daily., Disp: 90 capsule, Rfl:  1 .  promethazine (PHENERGAN) 12.5 MG tablet, Take 1 tablet (12.5 mg total) by mouth every 8 (eight) hours as needed for nausea or vomiting., Disp: 20 tablet, Rfl: 0 .  SUMAtriptan (IMITREX) 50 MG tablet, Take 1 tablet (50 mg total) by mouth every 2 (two) hours as needed for migraine. May repeat in 2 hours if headache persists or recurs., Disp: 10 tablet, Rfl: 0 .  tiZANidine (ZANAFLEX) 4 MG capsule, Take 1 capsule (4 mg total) by mouth 3 (three) times daily., Disp: 90 capsule, Rfl: 2 .  valACYclovir (VALTREX) 500 MG tablet, TAKE ONE CAPLET BY MOUTH THREE TIMES DAILY, Disp: 90 tablet, Rfl: 1  No Known Allergies   ROS  Constitutional: Negative for fever or weight change.  Respiratory: Negative for cough and shortness of breath.   Cardiovascular: Negative for chest pain or palpitations.  Gastrointestinal: Negative for abdominal pain, no bowel changes.  Musculoskeletal: Negative for gait problem or joint swelling.  Skin: Negative for rash.  Neurological: positive  for intermittent dizziness and  headache.  No other specific complaints in a complete review of systems (except as listed in HPI above).  Objective  Vitals:   01/03/18 1142  BP: 110/62  Pulse: (!) 105  Resp: 16  SpO2: 97%  Weight: 217 lb 11.2 oz (98.7 kg)  Height: 5\' 9"  (1.753 m)    Body mass index is 32.15  kg/m.  Physical Exam  Constitutional: Patient appears well-developed and well-nourished. Obese  No distress.  HEENT: head atraumatic, normocephalic, pupils equal and reactive to light,  neck supple, throat within normal limits Cardiovascular: Normal rate, regular rhythm and normal heart sounds.  No murmur heard. No BLE edema. Pulmonary/Chest: Effort normal and breath sounds normal. No respiratory distress. Abdominal: Soft.  There is no tenderness. Psychiatric: Patient has a normal mood and affect. behavior is normal. Judgment and thought content normal.  Recent Results (from the past 2160 hour(s))  POCT HgB A1C     Status: Abnormal   Collection Time: 01/03/18 11:46 AM  Result Value Ref Range   Hemoglobin A1C 6.7     PHQ2/9: Depression screen [facility] 2/9 12/19/2016 10/11/2016 09/16/2016 06/08/2016 03/09/2016  Decreased Interest 0 0 0 0 0  Down, Depressed, Hopeless 0 0 0 0 0  PHQ - 2 Score 0 0 0 0 0  Altered sleeping - - - - -  Tired, decreased energy - - - - -  Change in appetite - - - - -  Feeling bad or failure about yourself  - - - - -  Trouble concentrating - - - - -  Moving slowly or fidgety/restless - - - - -  Suicidal thoughts - - - - -  PHQ-9 Score - - - - -  Difficult doing work/chores - - - - -     Fall Risk: Fall Risk  01/03/2018 10/04/2017 07/04/2017 12/19/2016 10/11/2016  Falls in the past year? No No No No No  Number falls in past yr: - - - - -  Injury with Fall? - - - - -  Comment - - - - -     Functional Status Survey: Is the patient deaf or have difficulty hearing?: No Does the patient have difficulty seeing, even when wearing glasses/contacts?: No Does the patient have difficulty concentrating, remembering, or making decisions?: No Does the patient have difficulty walking or climbing stairs?: No Does the patient have difficulty dressing or bathing?: No Does the patient have difficulty doing errands alone such as visiting  a doctor's office or shopping?:  No    Assessment & Plan  1. 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 Chronic recurrent major depressive disorder (HCC)  - amphetamine-dextroamphetamine (ADDERALL) 15 MG tablet; Take 1 tablet by mouth 2 (two) times daily.  Dispense: 60 tablet; Refill: 0 - amphetamine-dextroamphetamine (ADDERALL) 15 MG tablet; Take 1 tablet by mouth 2 (two) times daily. Fill 02/02/2018  Dispense: 60 tablet; Refill: 0 - amphetamine-dextroamphetamine (ADDERALL) 15 MG tablet; Take 1 tablet by mouth 2 (two) times daily. Fill 03/05/2018  Dispense: 60 tablet; Refill: 0  3. 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  Dispense: 180 tablet; Refill: 1  4. GAD (generalized anxiety disorder)  - ALPRAZolam (XANAX) 1 MG tablet; Take 0.5-1 tablets (0.5-1 mg total) by mouth at bedtime as needed for anxiety.  Dispense: 45 tablet; Refill: 0  5. Gastric reflux  - omeprazole (PRILOSEC) 40 MG capsule; Take 1 capsule (40 mg total) by mouth daily.  Dispense: 90 capsule; Refill: 1  6. 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 Melrose Nakayama, had a severe episode in Dec  7. Bilateral hearing loss, unspecified hearing loss type  Doing well

## 2018-01-10 ENCOUNTER — Encounter: Payer: Self-pay | Admitting: Family Medicine

## 2018-03-13 ENCOUNTER — Encounter: Payer: Self-pay | Admitting: Family Medicine

## 2018-03-13 ENCOUNTER — Telehealth: Payer: Self-pay | Admitting: Family Medicine

## 2018-03-13 DIAGNOSIS — K219 Gastro-esophageal reflux disease without esophagitis: Secondary | ICD-10-CM

## 2018-03-13 MED ORDER — OMEPRAZOLE 40 MG PO CPDR: 40.0000 mg | DELAYED_RELEASE_CAPSULE | Freq: Every day | ORAL | 0 refills | Status: DC

## 2018-03-13 NOTE — Telephone Encounter (Signed)
I will send new direction, last visit we discussed and he was taking omeprazole in am and ranitidine in pm

## 2018-03-13 NOTE — Telephone Encounter (Signed)
Pharmacy is calling to clarify how patient is taking omeprazole- they would like Rx rewritten with bid and correct # if that is what the patient is using so his count will be correct.

## 2018-03-13 NOTE — Addendum Note (Signed)
Addended by: Steele Sizer F on: 03/13/2018 05:38 PM   Modules accepted: Orders

## 2018-04-06 ENCOUNTER — Ambulatory Visit: Payer: Medicare Other | Admitting: Family Medicine

## 2018-04-06 ENCOUNTER — Encounter: Payer: Self-pay | Admitting: Family Medicine

## 2018-04-06 VITALS — BP 108/70 | HR 85 | Temp 98.2°F | Resp 18 | Ht 69.0 in | Wt 222.0 lb

## 2018-04-06 DIAGNOSIS — F339 Major depressive disorder, recurrent, unspecified: Secondary | ICD-10-CM

## 2018-04-06 DIAGNOSIS — E782 Mixed hyperlipidemia: Secondary | ICD-10-CM | POA: Diagnosis not present

## 2018-04-06 DIAGNOSIS — E1129 Type 2 diabetes mellitus with other diabetic kidney complication: Secondary | ICD-10-CM | POA: Diagnosis not present

## 2018-04-06 DIAGNOSIS — N521 Erectile dysfunction due to diseases classified elsewhere: Secondary | ICD-10-CM | POA: Diagnosis not present

## 2018-04-06 DIAGNOSIS — E1169 Type 2 diabetes mellitus with other specified complication: Secondary | ICD-10-CM | POA: Diagnosis not present

## 2018-04-06 DIAGNOSIS — E785 Hyperlipidemia, unspecified: Secondary | ICD-10-CM | POA: Diagnosis not present

## 2018-04-06 DIAGNOSIS — R809 Proteinuria, unspecified: Secondary | ICD-10-CM

## 2018-04-06 DIAGNOSIS — F411 Generalized anxiety disorder: Secondary | ICD-10-CM | POA: Diagnosis not present

## 2018-04-06 DIAGNOSIS — Z79899 Other long term (current) drug therapy: Secondary | ICD-10-CM

## 2018-04-06 DIAGNOSIS — K219 Gastro-esophageal reflux disease without esophagitis: Secondary | ICD-10-CM

## 2018-04-06 DIAGNOSIS — E1140 Type 2 diabetes mellitus with diabetic neuropathy, unspecified: Secondary | ICD-10-CM

## 2018-04-06 MED ORDER — AMPHETAMINE-DEXTROAMPHETAMINE 15 MG PO TABS: 15.0000 mg | ORAL_TABLET | Freq: Two times a day (BID) | ORAL | 0 refills | Status: DC

## 2018-04-06 MED ORDER — METFORMIN HCL 500 MG PO TABS: 500.0000 mg | ORAL_TABLET | Freq: Two times a day (BID) | ORAL | 1 refills | Status: DC

## 2018-04-06 MED ORDER — ALPRAZOLAM 1 MG PO TABS: 0.5000 mg | ORAL_TABLET | Freq: Every evening | ORAL | 0 refills | Status: DC | PRN

## 2018-04-06 MED ORDER — RANITIDINE HCL 150 MG PO TABS: 150.0000 mg | ORAL_TABLET | Freq: Every day | ORAL | 1 refills | Status: DC

## 2018-04-06 MED ORDER — OMEGA-3-ACID ETHYL ESTERS 1 G PO CAPS
2.0000 g | ORAL_CAPSULE | Freq: Two times a day (BID) | ORAL | 1 refills | Status: DC
Start: 2018-04-06 — End: 2018-10-17

## 2018-04-06 MED ORDER — ATORVASTATIN CALCIUM 80 MG PO TABS: 80.0000 mg | ORAL_TABLET | Freq: Every day | ORAL | 1 refills | Status: DC

## 2018-04-06 MED ORDER — BUPROPION HCL ER (XL) 300 MG PO TB24: 300.0000 mg | ORAL_TABLET | Freq: Every day | ORAL | 1 refills | Status: DC

## 2018-04-06 NOTE — Progress Notes (Signed)
Name: Shane Boyd.   MRN: [id number]    DOB: 1951/01/18   Date:04/06/2018       Progress Note  Subjective  Chief Complaint  Chief Complaint  Patient presents with  . Medication Refill  . Diabetes  . Depression  . Anxiety  . Gastroesophageal Reflux  . 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 He has ED . His hgbA1C has been at goal, but stable at 6.7% past two times, he has microalbuminuria also- he is on low dose ace, but having hypotenstion with dizziness and is down to half pill daily, doing okay now but can stop if continues to be dizzy.   ED: taking generic Viagra and is doing well, denies side effects of medication. Does not need refill today   Major Depressive Disorder: he is doing better now, he was in survival mode when getting treated for Merkel skin cancer Summer 2018 , and feels grateful now that he is doing well. Hestopped Celexa months ago because of side effects. Denies suicidal ideation.  He is doing better, happy about the hear phones he has been use. Phq9 has improved  Still on Wellbutrin and Adderal   GAD: discussed importance of going down on BZD dose. He still feels anxious, denies recent panic attack. He is down toabout 25pills monthly.We will adjust amount of pills, from 30 pills to 25 pills per month, he does not like Atarax, but weaning self off , now Alprazolam 1 mg to take prn and #45 pills to last 3 months since 01/20019 and is doing well.   GERD:  He  is aware of long term use of PPI, he is now taking omeprazole in am and taking Ranitidine at night and is working well for him . He still has eructation, but no pain or heartburn. Unchanged since last visit   Dyslipidemia: taking statin therapy and denies myalgia. No chest pain. He is on Lovaza now. Denies side effects.Last labs reviewed, triglycerides elevated, HDL was low.We will recheck labs today  Hearing loss: he  was advised to have hearing aid, but he decided to by a [employer] hear phones and is doing well.   Patient Active Problem List   Diagnosis Date Noted  . Difficulty balancing 05/16/2017  . Special screening for malignant neoplasms, colon   . Benign neoplasm of ascending colon   . Benign neoplasm of transverse colon   . Hearing loss, sensorineural, unilateral 10/21/2016  . 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 GAD (generalized anxiety disorder) 09/16/2016  . Genital herpes 06/08/2016  . Dizziness 03/09/2016  . Neck pain 07/29/2015  . Anxiety 07/19/2015  . Insomnia, persistent 07/19/2015  . CN (constipation) 07/19/2015  . 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 Dyslipidemia 07/19/2015  . Gastric reflux 07/19/2015  . History of shingles 07/19/2015  . History of basal cell cancer 07/19/2015  . H/O Malignant melanoma 07/19/2015  . Personal history of malignant neoplasm of head and neck 07/19/2015  . Chronic recurrent major depressive disorder ([place]) 07/19/2015  . Obesity (BMI 30.0-34.9) 07/19/2015  . ED (erectile dysfunction) 07/19/2015    Past Surgical History:  Procedure Laterality Date  . ARM SKIN LESION BIOPSY / EXCISION Right 05/18/2017  . COLONOSCOPY    . COLONOSCOPY WITH PROPOFOL N/A 12/15/2016   Procedure: COLONOSCOPY WITH PROPOFOL;  Surgeon:  Lucilla Lame, MD;  Location: [place];  Service: Endoscopy;  Laterality: N/A;  diabetic - oral meds  . NASAL SEPTUM SURGERY    . POLYPECTOMY  12/15/2016   Procedure: POLYPECTOMY;  Surgeon: Lucilla Lame, MD;  Location: [place];  Service: Endoscopy;;  . SENTINEL NODE BIOPSY Right 05/18/2017   [facility]    Family History  Problem Relation Age of Onset  . Diabetes Mother   . Heart disease Mother   . Hyperlipidemia Mother   . CVA Father   . Cancer Father        Colon  .  Depression Father     Social History   Socioeconomic History  . Marital status: Married    Spouse name: Not on file  . Number of children: Not on file  . Years of education: Not on file  . Highest education level: Not on file  Occupational History  . Occupation: retired  Scientific laboratory technician  . Financial resource strain: Not on file  . Food insecurity:    Worry: Not on file    Inability: Not on file  . Transportation needs:    Medical: Not on file    Non-medical: Not on file  Tobacco Use  . Smoking status: Never Smoker  . Smokeless tobacco: Never Used  Substance and Sexual Activity  . Alcohol use: Yes    Alcohol/week: 0.0 oz    Comment: Holidays  . Drug use: No  . Sexual activity: Yes    Partners: Female  Lifestyle  . Physical activity:    Days per week: Not on file    Minutes per session: Not on file  . Stress: Not on file  Relationships  . Social connections:    Talks on phone: Not on file    Gets together: Not on file    Attends religious service: Not on file    Active member of club or organization: Not on file    Attends meetings of clubs or organizations: Not on file    Relationship status: Not on file  . Intimate partner violence:    Fear of current or ex partner: Not on file    Emotionally abused: Not on file    Physically abused: Not on file    Forced sexual activity: Not on file  Other Topics Concern  . Not on file  Social History Narrative   One son: [name] lives in [place]   One son Trip , died from possible suicide while living in [place]     Current Outpatient Medications:  .  ALPRAZolam (XANAX) 1 MG tablet, Take 0.5-1 tablets (0.5-1 mg total) by mouth at bedtime as needed for anxiety., Disp: 45 tablet, Rfl: 0 .  amphetamine-dextroamphetamine (ADDERALL) 15 MG tablet, Take 1 tablet by mouth 2 (two) times daily., Disp: 60 tablet, Rfl: 0 .  amphetamine-dextroamphetamine (ADDERALL) 15 MG tablet, Take 1 tablet by mouth 2 (two) times daily. Fill 05/06/2018,  Disp: 60 tablet, Rfl: 0 .  amphetamine-dextroamphetamine (ADDERALL) 15 MG tablet, Take 1 tablet by mouth 2 (two) times daily., Disp: 60 tablet, Rfl: 0 .  atorvastatin (LIPITOR) 80 MG tablet, Take 1 tablet (80 mg total) by mouth daily., Disp: 90 tablet, Rfl: 1 .  buPROPion (WELLBUTRIN XL) 300 MG 24 hr tablet, Take 1 tablet (300 mg total) by mouth daily., Disp: 90 tablet, Rfl: 1 .  docusate sodium (COLACE) 100 MG capsule, Take 1 capsule (100 mg total) by mouth 2 (two) times daily., Disp: 30 capsule, Rfl: 0 .  fluoruracil (CARAC) 0.5 % cream, Apply to affected areas of scalp, foreheard and temples twice dialy for 4-5 days., Disp: , Rfl: 0 .  lisinopril (PRINIVIL,ZESTRIL) 5 MG tablet, Take 2.5 mg at night., Disp: , Rfl:  .  metFORMIN (GLUCOPHAGE) 500 MG tablet, Take 1 tablet (500 mg total) by mouth 2 (two) times daily., Disp: 180 tablet, Rfl: 1 .  nortriptyline (PAMELOR) 10 MG capsule, Take 2 capsules by mouth at bedtime., Disp: , Rfl:  .  omega-3 acid ethyl esters (LOVAZA) 1 g capsule, Take 2 capsules (2 g total) by mouth 2 (two) times daily., Disp: 480 capsule, Rfl: 1 .  omeprazole (PRILOSEC) 40 MG capsule, Take 1 capsule (40 mg total) by mouth daily., Disp: 90 capsule, Rfl: 0 .  ranitidine (ZANTAC) 150 MG tablet, Take 1 tablet (150 mg total) by mouth at bedtime., Disp: 90 tablet, Rfl: 1 .  sildenafil (REVATIO) 20 MG tablet, Take 20 mg by mouth daily as needed., Disp: , Rfl:  .  tiZANidine (ZANAFLEX) 4 MG capsule, Take 1 capsule (4 mg total) by mouth 3 (three) times daily., Disp: 90 capsule, Rfl: 2 .  valACYclovir (VALTREX) 500 MG tablet, TAKE ONE CAPLET BY MOUTH THREE TIMES DAILY, Disp: 90 tablet, Rfl: 1  No Known Allergies   ROS  Constitutional: Negative for fever or weight change.  Respiratory: Negative for cough and shortness of breath.   Cardiovascular: Negative for chest pain or palpitations.  Gastrointestinal: Negative for abdominal pain, no bowel changes.  Musculoskeletal: Negative for  gait problem or joint swelling.  Skin: Negative for rash.  Neurological: Negative for dizziness or headache.  No other specific complaints in a complete review of systems (except as listed in HPI above).  Objective  Vitals:   04/06/18 1207  BP: 108/70  Pulse: 85  Resp: 18  Temp: 98.2 F (36.8 C)  TempSrc: Oral  SpO2: 97%  Weight: 222 lb (100.7 kg)  Height: 5\' 9"  (1.753 m)    Body mass index is 32.78 kg/m.  Physical Exam  Constitutional: Patient appears well-developed and well-nourished. Obese No distress.  HEENT: head atraumatic, normocephalic, pupils equal and reactive to light,neck supple, throat within normal limits Cardiovascular: Normal rate, regular rhythm and normal heart sounds.  No murmur heard. No BLE edema. Pulmonary/Chest: Effort normal and breath sounds normal. No respiratory distress. Abdominal: Soft.  There is no tenderness. Psychiatric: Patient has a normal mood and affect. behavior is normal. Judgment and thought content normal. Skin: area on left arm because of recent biopsy   PHQ2/9: Depression screen [facility] 2/9 04/06/2018 12/19/2016 10/11/2016 09/16/2016 06/08/2016  Decreased Interest 0 0 0 0 0  Down, Depressed, Hopeless 1 0 0 0 0  PHQ - 2 Score 1 0 0 0 0  Altered sleeping 0 - - - -  Tired, decreased energy 1 - - - -  Change in appetite 0 - - - -  Feeling bad or failure about yourself  0 - - - -  Trouble concentrating - - - - -  Moving slowly or fidgety/restless 0 - - - -  Suicidal thoughts 0 - - - -  PHQ-9 Score 2 - - - -  Difficult doing work/chores Not difficult at all - - - -     Fall Risk: Fall Risk  04/06/2018 04/06/2018 01/03/2018 10/04/2017 07/04/2017  Falls in the past year? No No No No No  Number falls in past yr: - - - - -  Injury with Fall? - - - - -  Comment - - - - -    Functional Status Survey: Is the patient deaf or have difficulty hearing?: Yes(Hearing loss-[employer] Hearphones) Does the patient have difficulty seeing, even when wearing  glasses/contacts?: Yes(prescription glasses) Does the patient have difficulty concentrating, remembering, or making decisions?: No Does the patient have difficulty walking or climbing stairs?: No Does the patient have difficulty dressing or bathing?: No Does the patient have difficulty doing errands alone such as visiting a doctor's office or shopping?: No     GAD 7 : Generalized Anxiety Score 04/06/2018 09/16/2016 06/08/2016  Nervous, Anxious, on Edge 0 1 1  Control/stop worrying 0 1 0  Worry too much - different things 0 1 0  Trouble relaxing 0 1 0  Restless 0 0 0  Easily annoyed or irritable 1 1 1   Afraid - awful might happen 0 0 0  Total GAD 7 Score 1 5 2   Anxiety Difficulty Not difficult at all Somewhat difficult Not difficult at all      Assessment & Plan  1. GAD (generalized anxiety disorder)  He is aware of risk of BZD in his age  - ALPRAZolam (XANAX) 1 MG tablet; Take 0.5-1 tablets (0.5-1 mg total) by mouth at bedtime as needed for anxiety.  Dispense: 45 tablet; Refill: 0  2. Chronic recurrent major depressive disorder (HCC)  - amphetamine-dextroamphetamine (ADDERALL) 15 MG tablet; Take 1 tablet by mouth 2 (two) times daily.  Dispense: 60 tablet; Refill: 0 - amphetamine-dextroamphetamine (ADDERALL) 15 MG tablet; Take 1 tablet by mouth 2 (two) times daily. Fill 05/06/2018  Dispense: 60 tablet; Refill: 0 - amphetamine-dextroamphetamine (ADDERALL) 15 MG tablet; Take 1 tablet by mouth 2 (two) times daily.  Dispense: 60 tablet; Refill: 0 - buPROPion (WELLBUTRIN XL) 300 MG 24 hr tablet; Take 1 tablet (300 mg total) by mouth daily.  Dispense: 90 tablet; Refill: 1  3. Dyslipidemia  - atorvastatin (LIPITOR) 80 MG tablet; Take 1 tablet (80 mg total) by mouth daily.  Dispense: 90 tablet; Refill: 1  4. 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  Dispense:  180 tablet; Refill: 1  5. Dyslipidemia with low high density lipoprotein (HDL) cholesterol 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)  - omega-3 acid ethyl esters (LOVAZA) 1 g capsule; Take 2 capsules (2 g total) by mouth 2 (two) times daily.  Dispense: 480 capsule; Refill: 1  6. Gastric reflux  He is also taking Ranitidine at night.   8. Diabetes mellitus with microalbuminuria (HCC)  Urine micro/creatitine ratio

## 2018-05-01 LAB — TEST AUTHORIZATION

## 2018-05-01 LAB — COMPLETE METABOLIC PANEL WITH GFR
AG Ratio: 2.3 (calc) (ref 1.0–2.5)
ALBUMIN MSPROF: 4.5 g/dL (ref 3.6–5.1)
ALKALINE PHOSPHATASE (APISO): 87 U/L (ref 40–115)
ALT: 18 U/L (ref 9–46)
AST: 18 U/L (ref 10–35)
BUN: 15 mg/dL (ref 7–25)
CO2: 28 mmol/L (ref 20–32)
CREATININE: 0.96 mg/dL (ref 0.70–1.25)
Calcium: 9.4 mg/dL (ref 8.6–10.3)
Chloride: 102 mmol/L (ref 98–110)
GFR, Est African American: 94 mL/min/{1.73_m2} (ref >=60)
GFR, Est Non African American: 81 mL/min/{1.73_m2} (ref >=60)
GLUCOSE: 100 mg/dL — AB (ref 65–99)
Globulin: 2 g/dL (calc) (ref 1.9–3.7)
Potassium: 4.6 mmol/L (ref 3.5–5.3)
Sodium: 139 mmol/L (ref 135–146)
TOTAL PROTEIN: 6.5 g/dL (ref 6.1–8.1)
Total Bilirubin: 0.4 mg/dL (ref 0.2–1.2)

## 2018-05-01 LAB — CBC WITH DIFFERENTIAL/PLATELET
Basophils Absolute: 41 cells/uL (ref 0–200)
Basophils Relative: 0.6 %
EOS ABS: 150 {cells}/uL (ref 15–500)
Eosinophils Relative: 2.2 %
HCT: 36.7 % — ABNORMAL LOW (ref 38.5–50.0)
Hemoglobin: 12.4 g/dL — ABNORMAL LOW (ref 13.2–17.1)
Lymphs Abs: 2366 cells/uL (ref 850–3900)
MCH: 26.8 pg — AB (ref 27.0–33.0)
MCHC: 33.8 g/dL (ref 32.0–36.0)
MCV: 79.3 fL — ABNORMAL LOW (ref 80.0–100.0)
MONOS PCT: 6.2 %
MPV: 10.7 fL (ref 7.5–12.5)
NEUTROS PCT: 56.2 %
Neutro Abs: 3822 cells/uL (ref 1500–7800)
PLATELETS: 211 10*3/uL (ref 140–400)
RBC: 4.63 10*6/uL (ref 4.20–5.80)
RDW: 14.9 % (ref 11.0–15.0)
TOTAL LYMPHOCYTE: 34.8 %
WBC mixed population: 422 cells/uL (ref 200–950)
WBC: 6.8 10*3/uL (ref 3.8–10.8)

## 2018-05-01 LAB — MICROALBUMIN / CREATININE URINE RATIO
Creatinine, Urine: 66 mg/dL (ref 20–320)
Microalb, Ur: <0.2 mg/dL

## 2018-05-01 LAB — LIPID PANEL
CHOLESTEROL: 120 mg/dL (ref <200)
HDL: 42 mg/dL (ref >40)
LDL CHOLESTEROL (CALC): 50 mg/dL
Non-HDL Cholesterol (Calc): 78 mg/dL (calc) (ref <130)
TRIGLYCERIDES: 216 mg/dL — AB (ref <150)
Total CHOL/HDL Ratio: 2.9 (calc) (ref <5.0)

## 2018-05-01 LAB — IRON,TIBC AND FERRITIN PANEL
%SAT: 14 % (calc) — ABNORMAL LOW (ref 15–60)
FERRITIN: 14 ng/mL — AB (ref 20–380)
IRON: 50 ug/dL (ref 50–180)
TIBC: 355 ug/dL (ref 250–425)

## 2018-07-12 ENCOUNTER — Ambulatory Visit: Payer: Medicare Other | Admitting: Family Medicine

## 2018-07-12 ENCOUNTER — Encounter: Payer: Self-pay | Admitting: Family Medicine

## 2018-07-12 VITALS — BP 122/64 | HR 90 | Temp 97.9°F | Resp 14 | Ht 69.0 in | Wt 224.3 lb

## 2018-07-12 DIAGNOSIS — H9193 Unspecified hearing loss, bilateral: Secondary | ICD-10-CM

## 2018-07-12 DIAGNOSIS — F411 Generalized anxiety disorder: Secondary | ICD-10-CM | POA: Diagnosis not present

## 2018-07-12 DIAGNOSIS — F339 Major depressive disorder, recurrent, unspecified: Secondary | ICD-10-CM | POA: Diagnosis not present

## 2018-07-12 DIAGNOSIS — E785 Hyperlipidemia, unspecified: Secondary | ICD-10-CM

## 2018-07-12 DIAGNOSIS — E782 Mixed hyperlipidemia: Secondary | ICD-10-CM

## 2018-07-12 DIAGNOSIS — K219 Gastro-esophageal reflux disease without esophagitis: Secondary | ICD-10-CM

## 2018-07-12 DIAGNOSIS — E1169 Type 2 diabetes mellitus with other specified complication: Secondary | ICD-10-CM

## 2018-07-12 DIAGNOSIS — E1140 Type 2 diabetes mellitus with diabetic neuropathy, unspecified: Secondary | ICD-10-CM | POA: Diagnosis not present

## 2018-07-12 DIAGNOSIS — N521 Erectile dysfunction due to diseases classified elsewhere: Secondary | ICD-10-CM | POA: Diagnosis not present

## 2018-07-12 LAB — POCT GLYCOSYLATED HEMOGLOBIN (HGB A1C): HEMOGLOBIN A1C: 6.7 % — AB (ref 4.0–5.6)

## 2018-07-12 MED ORDER — AMPHETAMINE-DEXTROAMPHETAMINE 15 MG PO TABS: 15.0000 mg | ORAL_TABLET | Freq: Two times a day (BID) | ORAL | 0 refills | Status: DC

## 2018-07-12 MED ORDER — OMEPRAZOLE 40 MG PO CPDR: 40.0000 mg | DELAYED_RELEASE_CAPSULE | Freq: Every day | ORAL | 0 refills | Status: DC

## 2018-07-12 MED ORDER — ALPRAZOLAM 1 MG PO TABS: 0.5000 mg | ORAL_TABLET | Freq: Every evening | ORAL | 0 refills | Status: DC | PRN

## 2018-07-12 NOTE — Progress Notes (Signed)
Name: Shane Boyd.   MRN: [id number]    DOB: July 28, 1951   Date:07/12/2018       Progress Note  Subjective  Chief Complaint  Chief Complaint  Patient presents with  . Follow-up    patient is here for a 3 mth f/u  . Diabetes  . Depression  . Anxiety  . Gastroesophageal Reflux  . Dyslipidemia    HPI  GERD: he is now on Omeprazole in am and was taking Ranitidine at night, but had two episodes of severe symptoms and had to go back to bid for about one week, but is doing better and is back to Omeprazole in am and Ranitidine at night. He states the two episodes were associated with chocking and cough during the night.   Dyslipidemia : he is taking Lovaza BID, he states the taste is horrible, but has been able to take it as prescribed. He is also taking atorvastatin as prescribed   DMII with ED: he is taking Metformin twice daily, denies polyphagia, polydipsia or polyuria. He has ED . His hgbA1C has been at goal, but stable at 6.7% past three  times, he has microalbuminuria also- he was  low dose ace, but he had severe symptoms of hypotension and dizziness and is off medication now. He still gets lightheaded at times and has seen ENT in the past  ED: taking generic Viagra and is doing well, denies side effects of medication. 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 Hestopped Celexa months ago because of side effects. Denies suicidal ideation.  He is doing better, happy about the hear phones he has been use.Phq9 has improved Still on Wellbutrin and Adderal . Unchanged   GAD: discussed importance of going down on BZD dose. He still feels anxious, denies recent panic attack. He is down toabout 25pills monthly.We will adjust amount of pills, from 30 pills to 25 pills per month, he does not like Atarax, but weaning self off , now  Alprazolam 1 mg to take prn and #45 pills to last 3 months since 01/20019 and is doing well.   Hearing loss: he was advised to have hearing aid, but he decided to by a [place] hear phones.    Patient Active Problem List   Diagnosis Date Noted  . Difficulty balancing 05/16/2017  . Special screening for malignant neoplasms, colon   . Benign neoplasm of ascending colon   . Benign neoplasm of transverse colon   . Hearing loss, sensorineural, unilateral 10/21/2016  . Dyslipidemia with low high density lipoprotein (HDL) cholesterol with hypertriglyceridemia due to type 2 diabetes mellitus ([place]) 09/16/2016  . GAD (generalized anxiety disorder) 09/16/2016  . Genital herpes 06/08/2016  . Dizziness 03/09/2016  . History of nonmelanoma skin cancer 09/30/2015  . Neck pain 07/29/2015  . Anxiety 07/19/2015  . Insomnia, persistent 07/19/2015  . CN (constipation) 07/19/2015  . 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 Dyslipidemia 07/19/2015  . Gastric reflux 07/19/2015  . History of shingles 07/19/2015  . History of basal cell cancer 07/19/2015  . H/O Malignant melanoma 07/19/2015  . Personal history of malignant neoplasm of head and neck 07/19/2015  . Chronic recurrent major depressive disorder ([place]) 07/19/2015  . Obesity (BMI 30.0-34.9) 07/19/2015  . ED (erectile dysfunction) 07/19/2015    Past Surgical History:  Procedure  Laterality Date  . ARM SKIN LESION BIOPSY / EXCISION Right 05/18/2017  . COLONOSCOPY    . COLONOSCOPY WITH PROPOFOL N/A 12/15/2016   Procedure: COLONOSCOPY WITH PROPOFOL;  Surgeon: Lucilla Lame, MD;  Location: [facility];  Service: Endoscopy;  Laterality: N/A;  diabetic - oral meds  . NASAL SEPTUM SURGERY    . POLYPECTOMY  12/15/2016   Procedure: POLYPECTOMY;  Surgeon: Lucilla Lame, MD;  Location: [facility];  Service: Endoscopy;;  . SENTINEL NODE BIOPSY Right 05/18/2017   [facility]    Family History  Problem Relation  Age of Onset  . Diabetes Mother   . Heart disease Mother   . Hyperlipidemia Mother   . CVA Father   . Cancer Father        Colon  . Depression Father     Social History   Socioeconomic History  . Marital status: Married    Spouse name: Neoma Laming  . Number of children: 1  . Years of education: Not on file  . Highest education level: Master's degree (e.g., MA, MS, MEng, MEd, MSW, MBA)  Occupational History  . Occupation: retired  Scientific laboratory technician  . Financial resource strain: Not hard at all  . Food insecurity:    Worry: Never true    Inability: Never true  . Transportation needs:    Medical: No    Non-medical: No  Tobacco Use  . Smoking status: Never Smoker  . Smokeless tobacco: Never Used  Substance and Sexual Activity  . Alcohol use: Yes    Alcohol/week: 0.0 standard drinks    Comment: Holidays  . Drug use: No  . Sexual activity: Yes    Partners: Female  Lifestyle  . Physical activity:    Days per week: 7 days    Minutes per session: 40 min  . Stress: Not at all  Relationships  . Social connections:    Talks on phone: More than three times a week    Gets together: Three times a week    Attends religious service: Never    Active member of club or organization: No    Attends meetings of clubs or organizations: Never    Relationship status: Married  . Intimate partner violence:    Fear of current or ex partner: No    Emotionally abused: No    Physically abused: No    Forced sexual activity: No  Other Topics Concern  . Not on file  Social History Narrative   One son: [name] lives in [place]   One son Trip , died from possible suicide while living in [place]     Current Outpatient Medications:  .  ALPRAZolam (XANAX) 1 MG tablet, Take 0.5-1 tablets (0.5-1 mg total) by mouth at bedtime as needed for anxiety., Disp: 45 tablet, Rfl: 0 .  amphetamine-dextroamphetamine (ADDERALL) 15 MG tablet, Take 1 tablet by mouth 2 (two) times daily., Disp: 60 tablet, Rfl: 0 .   amphetamine-dextroamphetamine (ADDERALL) 15 MG tablet, Take 1 tablet by mouth 2 (two) times daily. Fill 05/06/2018, Disp: 60 tablet, Rfl: 0 .  amphetamine-dextroamphetamine (ADDERALL) 15 MG tablet, Take 1 tablet by mouth 2 (two) times daily., Disp: 60 tablet, Rfl: 0 .  atorvastatin (LIPITOR) 80 MG tablet, Take 1 tablet (80 mg total) by mouth daily., Disp: 90 tablet, Rfl: 1 .  buPROPion (WELLBUTRIN XL) 300 MG 24 hr tablet, Take 1 tablet (300 mg total) by mouth daily., Disp: 90 tablet, Rfl: 1 .  docusate sodium (COLACE) 100 MG capsule, Take  1 capsule (100 mg total) by mouth 2 (two) times daily., Disp: 30 capsule, Rfl: 0 .  fluoruracil (CARAC) 0.5 % cream, Apply to affected areas of scalp, foreheard and temples twice dialy for 4-5 days., Disp: , Rfl: 0 .  lisinopril (PRINIVIL,ZESTRIL) 5 MG tablet, Take 2.5 mg at night., Disp: , Rfl:  .  metFORMIN (GLUCOPHAGE) 500 MG tablet, Take 1 tablet (500 mg total) by mouth 2 (two) times daily., Disp: 180 tablet, Rfl: 1 .  nortriptyline (PAMELOR) 10 MG capsule, Take 2 capsules by mouth at bedtime., Disp: , Rfl:  .  omega-3 acid ethyl esters (LOVAZA) 1 g capsule, Take 2 capsules (2 g total) by mouth 2 (two) times daily., Disp: 480 capsule, Rfl: 1 .  omeprazole (PRILOSEC) 40 MG capsule, Take 1 capsule (40 mg total) by mouth daily., Disp: 90 capsule, Rfl: 0 .  ranitidine (ZANTAC) 150 MG tablet, Take 1 tablet (150 mg total) by mouth at bedtime., Disp: 90 tablet, Rfl: 1 .  sildenafil (REVATIO) 20 MG tablet, Take 20 mg by mouth daily as needed., Disp: , Rfl:  .  valACYclovir (VALTREX) 500 MG tablet, TAKE ONE CAPLET BY MOUTH THREE TIMES DAILY, Disp: 90 tablet, Rfl: 1  No Known Allergies   ROS  Constitutional: Negative for fever or weight change.  Respiratory: Negative for cough and shortness of breath.   Cardiovascular: Negative for chest pain or palpitations.  Gastrointestinal: Negative for abdominal pain, no bowel changes.  Musculoskeletal: Negative for gait  problem or joint swelling.  Skin: Negative for rash.   Neurological: Negative for dizziness or headache.  No other specific complaints in a complete review of systems (except as listed in HPI above).   Objective  Vitals:   07/12/18 1204  BP: 122/64  Pulse: 90  Resp: 14  Temp: 97.9 F (36.6 C)  TempSrc: Oral  SpO2: 95%  Weight: 224 lb 4.8 oz (101.7 kg)  Height: 5\' 9"  (1.753 m)    Body mass index is 33.12 kg/m.  Physical Exam  Constitutional: Patient appears well-developed and well-nourished. Obese No distress.  HEENT: head atraumatic, normocephalic, pupils equal and reactive to light,  neck supple, throat within normal limits Cardiovascular: Normal rate, regular rhythm and normal heart sounds.  No murmur heard. No BLE edema. Pulmonary/Chest: Effort normal and breath sounds normal. No respiratory distress. Abdominal: Soft.  There is no tenderness. Psychiatric: Patient has a normal mood and affect. behavior is normal. Judgment and thought content normal.  Recent Results (from the past 2160 hour(s))  COMPLETE METABOLIC PANEL WITH GFR     Status: Abnormal   Collection Time: 04/25/18  9:24 AM  Result Value Ref Range   Glucose, Bld 100 (H) 65 - 99 mg/dL    Comment: .            Fasting reference interval . For someone without known diabetes, a glucose value between 100 and 125 mg/dL is consistent with prediabetes and should be confirmed with a follow-up test. .    BUN 15 7 - 25 mg/dL   Creat 0.96 0.70 - 1.25 mg/dL    Comment: For patients >51 years of age, the reference limit for Creatinine is approximately 13% higher for people identified as African-American. .    GFR, Est Non African American 81 > OR = 60 mL/min/1.95m2   GFR, Est African American 94 > OR = 60 mL/min/1.100m2   BUN/Creatinine Ratio NOT APPLICABLE 6 - 22 (calc)   Sodium 139 135 - 146 mmol/L  Potassium 4.6 3.5 - 5.3 mmol/L   Chloride 102 98 - 110 mmol/L   CO2 28 20 - 32 mmol/L   Calcium 9.4 8.6 -  10.3 mg/dL   Total Protein 6.5 6.1 - 8.1 g/dL   Albumin 4.5 3.6 - 5.1 g/dL   Globulin 2.0 1.9 - 3.7 g/dL (calc)   AG Ratio 2.3 1.0 - 2.5 (calc)   Total Bilirubin 0.4 0.2 - 1.2 mg/dL   Alkaline phosphatase (APISO) 87 40 - 115 U/L   AST 18 10 - 35 U/L   ALT 18 9 - 46 U/L  Lipid panel     Status: Abnormal   Collection Time: 04/25/18  9:24 AM  Result Value Ref Range   Cholesterol 120 <200 mg/dL   HDL 42 >40 mg/dL   Triglycerides 216 (H) <150 mg/dL   LDL Cholesterol (Calc) 50 mg/dL (calc)    Comment: Reference range: <100 . Desirable range <100 mg/dL for primary prevention;   <70 mg/dL for patients with CHD or diabetic patients  with > or = 2 CHD risk factors. Marland Kitchen LDL-C is now calculated using the Martin-Hopkins  calculation, which is a validated novel method providing  better accuracy than the Friedewald equation in the  estimation of LDL-C.  Cresenciano Genre et al. Annamaria Helling. 3267;124(58): 2061-2068  (http://education.[facility].com/faq/FAQ164)    Total CHOL/HDL Ratio 2.9 <5.0 (calc)   Non-HDL Cholesterol (Calc) 78 <130 mg/dL (calc)    Comment: For patients with diabetes plus 1 major ASCVD risk  factor, treating to a non-HDL-C goal of <100 mg/dL  (LDL-C of <70 mg/dL) is considered a therapeutic  option.   CBC with Differential/Platelet     Status: Abnormal   Collection Time: 04/25/18  9:24 AM  Result Value Ref Range   WBC 6.8 3.8 - 10.8 Thousand/uL   RBC 4.63 4.20 - 5.80 Million/uL   Hemoglobin 12.4 (L) 13.2 - 17.1 g/dL   HCT 36.7 (L) 38.5 - 50.0 %   MCV 79.3 (L) 80.0 - 100.0 fL   MCH 26.8 (L) 27.0 - 33.0 pg   MCHC 33.8 32.0 - 36.0 g/dL   RDW 14.9 11.0 - 15.0 %   Platelets 211 140 - 400 Thousand/uL   MPV 10.7 7.5 - 12.5 fL   Neutro Abs 3,822 1,500 - 7,800 cells/uL   Lymphs Abs 2,366 850 - 3,900 cells/uL   WBC mixed population 422 200 - 950 cells/uL   Eosinophils Absolute 150 15 - 500 cells/uL   Basophils Absolute 41 0 - 200 cells/uL   Neutrophils Relative % 56.2 %   Total  Lymphocyte 34.8 %   Monocytes Relative 6.2 %   Eosinophils Relative 2.2 %   Basophils Relative 0.6 %  Microalbumin / creatinine urine ratio     Status: None   Collection Time: 04/25/18  9:24 AM  Result Value Ref Range   Creatinine, Urine 66 20 - 320 mg/dL   Microalb, Ur <0.2 mg/dL    Comment: Reference Range Not established    Microalb Creat Ratio NOTE <30 mcg/mg creat    Comment: The microalbumin value is less than 0.2 mg/dL therefore we are unable to calculate excretion and/or creatinine ratio. . . The ADA defines abnormalities in albumin excretion as follows: Marland Kitchen Category         Result (mcg/mg creatinine) . Normal                    <30 Microalbuminuria         30-299  Clinical albuminuria   > OR = 300 . The ADA recommends that at least two of three specimens collected within a 3-6 month period be abnormal before considering a patient to be within a diagnostic category.   Iron, TIBC and Ferritin Panel     Status: Abnormal   Collection Time: 04/25/18  9:24 AM  Result Value Ref Range   Iron 50 50 - 180 mcg/dL   TIBC 355 250 - 425 mcg/dL (calc)   %SAT 14 (L) 15 - 60 % (calc)   Ferritin 14 (L) 20 - 380 ng/mL  TEST AUTHORIZATION     Status: None   Collection Time: 04/25/18  9:24 AM  Result Value Ref Range   TEST NAME: IRON, TIBC AND FERRITIN PANEL    TEST CODE: [id number]    CLIENT CONTACT: CINDI CANTY    REPORT ALWAYS MESSAGE SIGNATURE      Comment: . The laboratory testing on this patient was verbally requested or confirmed by the ordering physician or his or her authorized representative after contact with an employee of [employer]. Federal regulations require that we maintain on file written authorization for all laboratory testing.  Accordingly we are asking that the ordering physician or his or her authorized representative sign a copy of this report and promptly return it to the client service  representative. . . Signature:____________________________________________________ . Please fax this signed page to [phone] or return it via your [employer] courier.   POCT HgB A1C     Status: Abnormal   Collection Time: 07/12/18 12:09 PM  Result Value Ref Range   Hemoglobin A1C 6.7 (A) 4.0 - 5.6 %   HbA1c POC (<> result, manual entry)     HbA1c, POC (prediabetic range)     HbA1c, POC (controlled diabetic range)        PHQ2/9: Depression screen [facility] 2/9 07/12/2018 04/06/2018 12/19/2016 10/11/2016 09/16/2016  Decreased Interest 0 0 0 0 0  Down, Depressed, Hopeless 0 1 0 0 0  PHQ - 2 Score 0 1 0 0 0  Altered sleeping 0 0 - - -  Tired, decreased energy 1 1 - - -  Change in appetite 1 0 - - -  Feeling bad or failure about yourself  0 0 - - -  Trouble concentrating 0 - - - -  Moving slowly or fidgety/restless 0 0 - - -  Suicidal thoughts 0 0 - - -  PHQ-9 Score 2 2 - - -  Difficult doing work/chores Not difficult at all Not difficult at all - - -     Fall Risk: Fall Risk  07/12/2018 04/06/2018 04/06/2018 01/03/2018 10/04/2017  Falls in the past year? No No No No No  Number falls in past yr: - - - - -  Injury with Fall? - - - - -  Comment - - - - -     Functional Status Survey: Is the patient deaf or have difficulty hearing?: Yes Does the patient have difficulty seeing, even when wearing glasses/contacts?: No Does the patient have difficulty concentrating, remembering, or making decisions?: No Does the patient have difficulty walking or climbing stairs?: No Does the patient have difficulty dressing or bathing?: No Does the patient have difficulty doing errands alone such as visiting a doctor's office or shopping?: No    Assessment & Plan  1. 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 Gastric reflux  - omeprazole (PRILOSEC) 40 MG capsule; Take 1  capsule (40 mg total) by mouth daily.  Dispense: 90 capsule; Refill:  0  3. GAD (generalized anxiety disorder)  - ALPRAZolam (XANAX) 1 MG tablet; Take 0.5-1 tablets (0.5-1 mg total) by mouth at bedtime as needed for anxiety.  Dispense: 45 tablet; Refill: 0  4. Chronic recurrent major depressive disorder (HCC)  - amphetamine-dextroamphetamine (ADDERALL) 15 MG tablet; Take 1 tablet by mouth 2 (two) times daily.  Dispense: 60 tablet; Refill: 0 - amphetamine-dextroamphetamine (ADDERALL) 15 MG tablet; Take 1 tablet by mouth 2 (two) times daily. Fill  09/09/2018  Dispense: 60 tablet; Refill: 0 - amphetamine-dextroamphetamine (ADDERALL) 15 MG tablet; Take 1 tablet by mouth 2 (two) times daily.  Dispense: 60 tablet; Refill: 0  5. Dyslipidemia with low high density lipoprotein (HDL) cholesterol with hypertriglyceridemia due to type 2 diabetes mellitus ([place])  On Lovaza now   6. Dyslipidemia  Continue Atorvastatin and Lovaza   7. Bilateral hearing loss, unspecified hearing loss type  Doing well with [employer]

## 2018-08-12 ENCOUNTER — Emergency Department: Payer: Medicare Other

## 2018-08-12 ENCOUNTER — Emergency Department
Admission: EM | Admit: 2018-08-12 | Discharge: 2018-08-12 | Disposition: A | Discharge status: Home / Self Care | Payer: Medicare Other | Attending: Emergency Medicine | Admitting: Emergency Medicine

## 2018-08-12 ENCOUNTER — Encounter: Payer: Self-pay | Admitting: Emergency Medicine

## 2018-08-12 ENCOUNTER — Other Ambulatory Visit: Payer: Self-pay

## 2018-08-12 DIAGNOSIS — E119 Type 2 diabetes mellitus without complications: Secondary | ICD-10-CM | POA: Insufficient documentation

## 2018-08-12 DIAGNOSIS — Z794 Long term (current) use of insulin: Secondary | ICD-10-CM | POA: Diagnosis not present

## 2018-08-12 DIAGNOSIS — N200 Calculus of kidney: Secondary | ICD-10-CM

## 2018-08-12 DIAGNOSIS — R1030 Lower abdominal pain, unspecified: Secondary | ICD-10-CM | POA: Insufficient documentation

## 2018-08-12 DIAGNOSIS — Z79899 Other long term (current) drug therapy: Secondary | ICD-10-CM | POA: Diagnosis not present

## 2018-08-12 LAB — CBC
HCT: 38.5 % — ABNORMAL LOW (ref 40.0–52.0)
HEMOGLOBIN: 13 g/dL (ref 13.0–18.0)
MCH: 26.9 pg (ref 26.0–34.0)
MCHC: 33.7 g/dL (ref 32.0–36.0)
MCV: 79.9 fL — ABNORMAL LOW (ref 80.0–100.0)
Platelets: 226 10*3/uL (ref 150–440)
RBC: 4.82 MIL/uL (ref 4.40–5.90)
RDW: 16.1 % — ABNORMAL HIGH (ref 11.5–14.5)
WBC: 15.4 10*3/uL — ABNORMAL HIGH (ref 3.8–10.6)

## 2018-08-12 LAB — COMPREHENSIVE METABOLIC PANEL
ALBUMIN: 4.5 g/dL (ref 3.5–5.0)
ALK PHOS: 86 U/L (ref 38–126)
ALT: 29 U/L (ref 0–44)
AST: 44 U/L — AB (ref 15–41)
Anion gap: 14 (ref 5–15)
BUN: 16 mg/dL (ref 8–23)
CALCIUM: 9.1 mg/dL (ref 8.9–10.3)
CO2: 22 mmol/L (ref 22–32)
CREATININE: 1.26 mg/dL — AB (ref 0.61–1.24)
Chloride: 100 mmol/L (ref 98–111)
GFR calc Af Amer: >60 mL/min (ref >60)
GFR calc non Af Amer: 57 mL/min — ABNORMAL LOW (ref >60)
GLUCOSE: 138 mg/dL — AB (ref 70–99)
Potassium: 4.4 mmol/L (ref 3.5–5.1)
Sodium: 136 mmol/L (ref 135–145)
Total Bilirubin: 0.7 mg/dL (ref 0.3–1.2)
Total Protein: 7.2 g/dL (ref 6.5–8.1)

## 2018-08-12 LAB — URINALYSIS, COMPLETE (UACMP) WITH MICROSCOPIC
BILIRUBIN URINE: NEGATIVE
Glucose, UA: NEGATIVE mg/dL
KETONES UR: 20 mg/dL — AB
Leukocytes, UA: NEGATIVE
Nitrite: NEGATIVE
Protein, ur: 100 mg/dL — AB
Specific Gravity, Urine: 1.031 — ABNORMAL HIGH (ref 1.005–1.030)
pH: 5 (ref 5.0–8.0)

## 2018-08-12 LAB — LIPASE, BLOOD: Lipase: 22 U/L (ref 11–51)

## 2018-08-12 MED ORDER — SODIUM CHLORIDE 0.9 % IV BOLUS
500.0000 mL | Freq: Once | INTRAVENOUS | Status: AC
  Administered 2018-08-12: 500 mL via INTRAVENOUS

## 2018-08-12 MED ORDER — IBUPROFEN 200 MG PO TABS: 600.0000 mg | ORAL_TABLET | Freq: Four times a day (QID) | ORAL | 0 refills | Status: DC | PRN

## 2018-08-12 MED ORDER — OXYCODONE-ACETAMINOPHEN 5-325 MG PO TABS: 1.0000 | ORAL_TABLET | ORAL | 0 refills | Status: DC | PRN

## 2018-08-12 MED ORDER — KETOROLAC TROMETHAMINE 30 MG/ML IJ SOLN
15.0000 mg | Freq: Once | INTRAMUSCULAR | Status: AC
  Administered 2018-08-12: 15 mg via INTRAVENOUS
  Filled 2018-08-12: qty 1

## 2018-08-12 MED ORDER — TAMSULOSIN HCL 0.4 MG PO CAPS: 0.4000 mg | ORAL_CAPSULE | Freq: Every day | ORAL | 0 refills | Status: DC

## 2018-08-12 MED ORDER — ONDANSETRON HCL 4 MG PO TABS: 4.0000 mg | ORAL_TABLET | Freq: Three times a day (TID) | ORAL | 0 refills | Status: DC | PRN

## 2018-08-12 MED ORDER — FENTANYL CITRATE (PF) 100 MCG/2ML IJ SOLN
50.0000 ug | Freq: Once | INTRAMUSCULAR | Status: AC
  Administered 2018-08-12: 50 ug via INTRAVENOUS
  Filled 2018-08-12: qty 2

## 2018-08-12 NOTE — ED Triage Notes (Signed)
Pt to ED via POV c/o suprapubic abd pain. Pt states that the pain had doubled him over. Pt states that he is not having pain with urination but has had sensation like he needed to urinate and couldn't. Pt denies fever or chills.

## 2018-08-12 NOTE — ED Provider Notes (Signed)
[facility] Department Provider Note  ____________________________________________   I have reviewed the triage vital signs and the nursing notes. Where available I have reviewed prior notes and, if possible and indicated, outside hospital notes.    HISTORY  Chief Complaint Abdominal Pain    HPI Shane Boyd. is a 67 y.o. male  History of chronic constipation anemia anxiety insomnia, and other medical problems presents with suprapubic abdominal discomfort.  Has had a before.  States that it has been a while.  He feels this is related to aspiration.  Patient takes 2 different stool softeners today.  He states that he began having suprapubic cramping discomfort and this morning.  He is also is a sensation that that he is not completely emptying his bladder but he actually is.  He denies any dysuria urinary frequency denies any fever chills, the pain is nonradiating, does not go to the back.  He woke up with it this morning is been there constantly ever since.  He has not ever had a work-up for this.  He cannot member exactly the last time he had a but this is not a unique experience for him.  No other alleviating or aggravating factors no other prior treatment no other complaints.  It is moderate to significant in severity Have a bowel movement this morning that was hard but not different from normal.  No melena no bright red blood per him no hematemesis.  No vomiting.    Past Medical History:  Diagnosis Date  . Anemia   . Anxiety   . Chronic insomnia   . Constipation   . Depression   . Diabetes mellitus without complication ([place])   . Encounter for long-term (current) use of other high-risk medications   . GERD (gastroesophageal reflux disease)   . History of malignant melanoma    Dr. Koleen Nimrod  . History of squamous cell carcinoma   . Hx of basal cell carcinoma   . Hyperlipidemia   . Leukocytosis   . Obesity   . Other male erectile  dysfunction   . Vertigo    1-2x/yr    Patient Active Problem List   Diagnosis Date Noted  . Difficulty balancing 05/16/2017  . Special screening for malignant neoplasms, colon   . Benign neoplasm of ascending colon   . Benign neoplasm of transverse colon   . Hearing loss, sensorineural, unilateral 10/21/2016  . Dyslipidemia with low high density lipoprotein (HDL) cholesterol with hypertriglyceridemia due to type 2 diabetes mellitus ([place]) 09/16/2016  . GAD (generalized anxiety disorder) 09/16/2016  . Genital herpes 06/08/2016  . Dizziness 03/09/2016  . History of nonmelanoma skin cancer 09/30/2015  . Neck pain 07/29/2015  . Anxiety 07/19/2015  . Insomnia, persistent 07/19/2015  . CN (constipation) 07/19/2015  . 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 Dyslipidemia 07/19/2015  . Gastric reflux 07/19/2015  . History of shingles 07/19/2015  . History of basal cell cancer 07/19/2015  . H/O Malignant melanoma 07/19/2015  . Personal history of malignant neoplasm of head and neck 07/19/2015  . Chronic recurrent major depressive disorder ([place]) 07/19/2015  . Obesity (BMI 30.0-34.9) 07/19/2015  . ED (erectile dysfunction) 07/19/2015    Past Surgical History:  Procedure Laterality Date  . ARM SKIN LESION BIOPSY / EXCISION Right 05/18/2017  . COLONOSCOPY    . COLONOSCOPY WITH PROPOFOL N/A 12/15/2016   Procedure: COLONOSCOPY WITH PROPOFOL;  Surgeon: Lucilla Lame, MD;  Location: [place];  Service:  Endoscopy;  Laterality: N/A;  diabetic - oral meds  . NASAL SEPTUM SURGERY    . POLYPECTOMY  12/15/2016   Procedure: POLYPECTOMY;  Surgeon: Lucilla Lame, MD;  Location: [place];  Service: Endoscopy;;  . SENTINEL NODE BIOPSY Right 05/18/2017   [facility]    Prior to Admission medications   Medication Sig Start Date End Date Taking? Authorizing Provider  ALPRAZolam Duanne Moron) 1 MG tablet Take 0.5-1 tablets (0.5-1 mg total) by mouth at  bedtime as needed for anxiety. 07/12/18   Steele Sizer, MD  amphetamine-dextroamphetamine (ADDERALL) 15 MG tablet Take 1 tablet by mouth 2 (two) times daily. 07/12/18   Steele Sizer, MD  amphetamine-dextroamphetamine (ADDERALL) 15 MG tablet Take 1 tablet by mouth 2 (two) times daily. Fill  09/09/2018 07/12/18   Steele Sizer, MD  amphetamine-dextroamphetamine (ADDERALL) 15 MG tablet Take 1 tablet by mouth 2 (two) times daily. 07/12/18   Steele Sizer, MD  atorvastatin (LIPITOR) 80 MG tablet Take 1 tablet (80 mg total) by mouth daily. 04/06/18   Steele Sizer, MD  buPROPion (WELLBUTRIN XL) 300 MG 24 hr tablet Take 1 tablet (300 mg total) by mouth daily. 04/06/18   Steele Sizer, MD  docusate sodium (COLACE) 100 MG capsule Take 1 capsule (100 mg total) by mouth 2 (two) times daily. 07/29/15   Steele Sizer, MD  fluoruracil [facility]) 0.5 % cream Apply to affected areas of scalp, foreheard and temples twice dialy for 4-5 days. 05/02/17   [provider]  metFORMIN (GLUCOPHAGE) 500 MG tablet Take 1 tablet (500 mg total) by mouth 2 (two) times daily. 04/06/18   Steele Sizer, MD  nortriptyline (PAMELOR) 10 MG capsule Take 2 capsules by mouth at bedtime. 06/13/17   Anabel Bene, MD  omega-3 acid ethyl esters (LOVAZA) 1 g capsule Take 2 capsules (2 g total) by mouth 2 (two) times daily. 04/06/18   Steele Sizer, MD  omeprazole (PRILOSEC) 40 MG capsule Take 1 capsule (40 mg total) by mouth daily. 07/12/18   Steele Sizer, MD  ranitidine (ZANTAC) 150 MG tablet Take 1 tablet (150 mg total) by mouth at bedtime. 04/06/18   Steele Sizer, MD  sildenafil (REVATIO) 20 MG tablet Take 20 mg by mouth daily as needed. 07/29/15   [provider]  valACYclovir (VALTREX) 500 MG tablet TAKE ONE CAPLET BY MOUTH THREE TIMES DAILY 10/25/17   Steele Sizer, MD    Allergies Patient has no known allergies.  Family History  Problem Relation Age of Onset  . Diabetes Mother   . Heart disease Mother   .  Hyperlipidemia Mother   . CVA Father   . Cancer Father        Colon  . Depression Father     Social History Social History   Tobacco Use  . Smoking status: Never Smoker  . Smokeless tobacco: Never Used  Substance Use Topics  . Alcohol use: Yes    Alcohol/week: 0.0 standard drinks    Comment: Holidays  . Drug use: No    Review of Systems Constitutional: No fever/chills Eyes: No visual changes. ENT: No sore throat. No stiff neck no neck pain Cardiovascular: Denies chest pain. Respiratory: Denies shortness of breath. Gastrointestinal:   no vomiting.  No diarrhea.  No constipation. Genitourinary: Negative for dysuria. Musculoskeletal: Negative lower extremity swelling Skin: Negative for rash. Neurological: Negative for severe headaches, focal weakness or numbness.   ____________________________________________   PHYSICAL EXAM:  VITAL SIGNS: ED Triage Vitals  Enc Vitals Group  BP 08/12/18 1802 (!) 165/88     Pulse Rate 08/12/18 1802 (!) 55     Resp 08/12/18 1802 18     Temp 08/12/18 1802 98.2 F (36.8 C)     Temp Source 08/12/18 1802 Oral     SpO2 08/12/18 1802 98 %     Weight 08/12/18 1802 215 lb (97.5 kg)     Height 08/12/18 1802 5\' 9"  (1.753 m)     Head Circumference --      Peak Flow --      Pain Score 08/12/18 1806 7     Pain Loc --      Pain Edu? --      Excl. in [place]? --     Constitutional: Alert and oriented. Well appearing and in no acute distress. Eyes: Conjunctivae are normal Head: Atraumatic HEENT: No congestion/rhinnorhea. Mucous membranes are moist.  Oropharynx non-erythematous Neck:   Nontender with no meningismus, no masses, no stridor Cardiovascular: Normal rate, regular rhythm. Grossly normal heart sounds.  Good peripheral circulation. Respiratory: Normal respiratory effort.  No retractions. Lungs CTAB. Abdominal: Soft and suprapubic tenderness noted, no pulsatile mass.  Strong inguinal and distal pulses noted.. No distention. No guarding  no rebound Back:  There is no focal tenderness or step off.  there is no midline tenderness there are no lesions noted. there is no CVA tenderness External genitalia no masses or lesions noted Musculoskeletal: No lower extremity tenderness, no upper extremity tenderness. No joint effusions, no DVT signs strong distal pulses no edema Neurologic:  Normal speech and language. No gross focal neurologic deficits are appreciated.  Skin:  Skin is warm, dry and intact. No rash noted. Psychiatric: Mood and affect are normal. Speech and behavior are normal.  ____________________________________________   LABS (all labs ordered are listed, but only abnormal results are displayed)  Labs Reviewed  COMPREHENSIVE METABOLIC PANEL - Abnormal; Notable for the following components:      Result Value   Glucose, Bld 138 (*)    Creatinine, Ser 1.26 (*)    AST 44 (*)    GFR calc non Af Amer 57 (*)    All other components within normal limits  CBC - Abnormal; Notable for the following components:   WBC 15.4 (*)    HCT 38.5 (*)    MCV 79.9 (*)    RDW 16.1 (*)    All other components within normal limits  URINALYSIS, COMPLETE (UACMP) WITH MICROSCOPIC - Abnormal; Notable for the following components:   Color, Urine AMBER (*)    APPearance HAZY (*)    Specific Gravity, Urine 1.031 (*)    Hgb urine dipstick LARGE (*)    Ketones, ur 20 (*)    Protein, ur 100 (*)    RBC / HPF >50 (*)    Bacteria, UA RARE (*)    All other components within normal limits  LIPASE, BLOOD    Pertinent labs  results that were available during my care of the patient were reviewed by me and considered in my medical decision making (see chart for details). ____________________________________________  EKG  I personally interpreted any EKGs ordered by me or triage  ____________________________________________  RADIOLOGY  Pertinent labs & imaging results that were available during my care of the patient were reviewed by me  and considered in my medical decision making (see chart for details). If possible, patient and/or family made aware of any abnormal findings.  No results found. ____________________________________________    PROCEDURES  Procedure(s) performed:  None  Procedures  Critical Care performed: None  ____________________________________________   INITIAL IMPRESSION / ASSESSMENT AND PLAN / ED COURSE  Pertinent labs & imaging results that were available during my care of the patient were reviewed by me and considered in my medical decision making (see chart for details).  Patient with suprapubic abdominal discomfort.  We did a bladder scan which did not show significant urinary retention which was her first thought.  AAA is possible kidney stones possible, will obtain CT scan.  Blood work is reassuring, white count is somewhat up.  Urinalysis besides hematuria and mild ketones does not go too much in the way of infection.  His nitrite and leukocyte negative.  My suspicion is most consistent with kidney stone although again AAA is possible and we will evaluate further with CT    ____________________________________________   FINAL CLINICAL IMPRESSION(S) / ED DIAGNOSES  Final diagnoses:  None      This chart was dictated using voice recognition software.  Despite best efforts to proofread,  errors can occur which can change meaning.      Schuyler Amor, MD 08/12/18 [phone]

## 2018-08-12 NOTE — ED Notes (Signed)
Pt verbalized that wife would be driving him home.

## 2018-08-12 NOTE — ED Notes (Signed)
Patient transported to CT 

## 2018-08-13 ENCOUNTER — Other Ambulatory Visit: Payer: Self-pay | Admitting: Family Medicine

## 2018-08-13 ENCOUNTER — Ambulatory Visit (INDEPENDENT_AMBULATORY_CARE_PROVIDER_SITE_OTHER): Payer: Medicare Other | Admitting: Family Medicine

## 2018-08-13 ENCOUNTER — Encounter: Payer: Self-pay | Admitting: Family Medicine

## 2018-08-13 VITALS — BP 136/88 | HR 89 | Temp 97.9°F | Resp 14 | Ht 69.0 in | Wt 223.9 lb

## 2018-08-13 DIAGNOSIS — N132 Hydronephrosis with renal and ureteral calculous obstruction: Secondary | ICD-10-CM

## 2018-08-13 DIAGNOSIS — K760 Fatty (change of) liver, not elsewhere classified: Secondary | ICD-10-CM | POA: Diagnosis not present

## 2018-08-13 DIAGNOSIS — Z23 Encounter for immunization: Secondary | ICD-10-CM

## 2018-08-13 DIAGNOSIS — K4020 Bilateral inguinal hernia, without obstruction or gangrene, not specified as recurrent: Secondary | ICD-10-CM | POA: Diagnosis not present

## 2018-08-13 NOTE — Patient Instructions (Signed)
Dietary Guidelines to Help Prevent Kidney Stones Kidney stones are deposits of minerals and salts that form inside your kidneys. Your risk of developing kidney stones may be greater depending on your diet, your lifestyle, the medicines you take, and whether you have certain medical conditions. Most people can reduce their chances of developing kidney stones by following the instructions below. Depending on your overall health and the type of kidney stones you tend to develop, your dietitian may give you more specific instructions. What are tips for following this plan? Reading food labels  Choose foods with "no salt added" or "low-salt" labels. Limit your sodium intake to less than 1500 mg per day.  Choose foods with calcium for each meal and snack. Try to eat about 300 mg of calcium at each meal. Foods that contain 200-500 mg of calcium per serving include: ? 8 oz (237 ml) of milk, fortified nondairy milk, and fortified fruit juice. ? 8 oz (237 ml) of kefir, yogurt, and soy yogurt. ? 4 oz (118 ml) of tofu. ? 1 oz of cheese. ? 1 cup (300 g) of dried figs. ? 1 cup (91 g) of cooked broccoli. ? 1-3 oz can of sardines or mackerel.  Most people need 1000 to 1500 mg of calcium each day. Talk to your dietitian about how much calcium is recommended for you. Shopping  Buy plenty of fresh fruits and vegetables. Most people do not need to avoid fruits and vegetables, even if they contain nutrients that may contribute to kidney stones.  When shopping for convenience foods, choose: ? Whole pieces of fruit. ? Premade salads with dressing on the side. ? Low-fat fruit and yogurt smoothies.  Avoid buying frozen meals or prepared deli foods.  Look for foods with live cultures, such as yogurt and kefir. Cooking  Do not add salt to food when cooking. Place a salt shaker on the table and allow each person to add his or her own salt to taste.  Use vegetable protein, such as beans, textured vegetable  protein (TVP), or tofu instead of meat in pasta, casseroles, and soups. Meal planning  Eat less salt, if told by your dietitian. To do this: ? Avoid eating processed or premade food. ? Avoid eating fast food.  Eat less animal protein, including cheese, meat, poultry, or fish, if told by your dietitian. To do this: ? Limit the number of times you have meat, poultry, fish, or cheese each week. Eat a diet free of meat at least 2 days a week. ? Eat only one serving each day of meat, poultry, fish, or seafood. ? When you prepare animal protein, cut pieces into small portion sizes. For most meat and fish, one serving is about the size of one deck of cards.  Eat at least 5 servings of fresh fruits and vegetables each day. To do this: ? Keep fruits and vegetables on hand for snacks. ? Eat 1 piece of fruit or a handful of berries with breakfast. ? Have a salad and fruit at lunch. ? Have two kinds of vegetables at dinner.  Limit foods that are high in a substance called oxalate. These include: ? Spinach. ? Rhubarb. ? Beets. ? Potato chips and french fries. ? Nuts.  If you regularly take a diuretic medicine, make sure to eat at least 1-2 fruits or vegetables high in potassium each day. These include: ? Avocado. ? Banana. ? Orange, prune, carrot, or tomato juice. ? Baked potato. ? Cabbage. ? Beans and split peas.   General instructions  Drink enough fluid to keep your urine clear or pale yellow. This is the most important thing you can do.  Talk to your health care provider and dietitian about taking daily supplements. Depending on your health and the cause of your kidney stones, you may be advised: ? Not to take supplements with vitamin C. ? To take a calcium supplement. ? To take a daily probiotic supplement. ? To take other supplements such as magnesium, fish oil, or vitamin B6.  Take all medicines and supplements as told by your health care provider.  Limit alcohol intake to no more  than 1 drink a day for nonpregnant women and 2 drinks a day for men. One drink equals 12 oz of beer, 5 oz of wine, or 1 oz of hard liquor.  Lose weight if told by your health care provider. Work with your dietitian to find strategies and an eating plan that works best for you. What foods are not recommended? Limit your intake of the following foods, or as told by your dietitian. Talk to your dietitian about specific foods you should avoid based on the type of kidney stones and your overall health. Grains Breads. Bagels. Rolls. Baked goods. Salted crackers. Cereal. Pasta. Vegetables Spinach. Rhubarb. Beets. Canned vegetables. Pickles. Olives. Meats and other protein foods Nuts. Nut butters. Large portions of meat, poultry, or fish. Salted or cured meats. Deli meats. Hot dogs. Sausages. Dairy Cheese. Beverages Regular soft drinks. Regular vegetable juice. Seasonings and other foods Seasoning blends with salt. Salad dressings. Canned soups. Soy sauce. Ketchup. Barbecue sauce. Canned pasta sauce. Casseroles. Pizza. Lasagna. Frozen meals. Potato chips. French fries. Summary  You can reduce your risk of kidney stones by making changes to your diet.  The most important thing you can do is drink enough fluid. You should drink enough fluid to keep your urine clear or pale yellow.  Ask your health care provider or dietitian how much protein from animal sources you should eat each day, and also how much salt and calcium you should have each day. This information is not intended to replace advice given to you by your health care provider. Make sure you discuss any questions you have with your health care provider. Document Released: 03/18/2011 Document Revised: 11/01/2016 Document Reviewed: 11/01/2016 [employer] Interactive Patient Education  2018 [employer]  

## 2018-08-13 NOTE — Progress Notes (Signed)
Name: Shane Boyd.   MRN: [id number]    DOB: 1951/04/20   Date:08/13/2018       Progress Note  Subjective  Chief Complaint  Chief Complaint  Patient presents with  . Hospitalization Follow-up  . Immunizations    high dose    HPI  Hydronephrosis with calculus: he states he woke up yesterday with supra pubic pain, he thought he was constipated, it was cramping, sharp, uncomfortable to sit still. He had to pace around the house. He took some laxatives and symptoms did not improve. He had some nausea, no fever or chills. Denied any vomiting. He states pain did not resolve and went to [facility] around 5 pm, he was given pain medication, had CT scan and diagnosed with kidney stone, he went home with flomax, oxycodone and ibuprofen and no pain at this time.    Patient Active Problem List   Diagnosis Date Noted  . Bilateral inguinal hernia without obstruction or gangrene 08/13/2018  . Fatty liver 08/13/2018  . Difficulty balancing 05/16/2017  . Special screening for malignant neoplasms, colon   . Benign neoplasm of ascending colon   . Benign neoplasm of transverse colon   . Hearing loss, sensorineural, unilateral 10/21/2016  . Dyslipidemia with low high density lipoprotein (HDL) cholesterol with hypertriglyceridemia due to type 2 diabetes mellitus ([place]) 09/16/2016  . GAD (generalized anxiety disorder) 09/16/2016  . Genital herpes 06/08/2016  . Dizziness 03/09/2016  . History of nonmelanoma skin cancer 09/30/2015  . Neck pain 07/29/2015  . Anxiety 07/19/2015  . Insomnia, persistent 07/19/2015  . CN (constipation) 07/19/2015  . 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 Dyslipidemia 07/19/2015  . Gastric reflux 07/19/2015  . History of shingles 07/19/2015  . History of basal cell cancer 07/19/2015  . H/O Malignant melanoma 07/19/2015  . Personal history of malignant neoplasm of head and neck 07/19/2015  . Chronic recurrent major depressive  disorder ([place]) 07/19/2015  . Obesity (BMI 30.0-34.9) 07/19/2015  . ED (erectile dysfunction) 07/19/2015    Past Surgical History:  Procedure Laterality Date  . ARM SKIN LESION BIOPSY / EXCISION Right 05/18/2017  . COLONOSCOPY    . COLONOSCOPY WITH PROPOFOL N/A 12/15/2016   Procedure: COLONOSCOPY WITH PROPOFOL;  Surgeon: Lucilla Lame, MD;  Location: [place];  Service: Endoscopy;  Laterality: N/A;  diabetic - oral meds  . NASAL SEPTUM SURGERY    . POLYPECTOMY  12/15/2016   Procedure: POLYPECTOMY;  Surgeon: Lucilla Lame, MD;  Location: [place];  Service: Endoscopy;;  . SENTINEL NODE BIOPSY Right 05/18/2017   [facility]    Family History  Problem Relation Age of Onset  . Diabetes Mother   . Heart disease Mother   . Hyperlipidemia Mother   . CVA Father   . Cancer Father        Colon  . Depression Father     Social History   Socioeconomic History  . Marital status: Married    Spouse name: Neoma Laming  . Number of children: 1  . Years of education: Not on file  . Highest education level: Master's degree (e.g., MA, MS, MEng, MEd, MSW, MBA)  Occupational History  . Occupation: retired  Scientific laboratory technician  . Financial resource strain: Not hard at all  . Food insecurity:    Worry: Never true    Inability: Never true  . Transportation needs:    Medical: No    Non-medical: No  Tobacco Use  . Smoking status: Never  Smoker  . Smokeless tobacco: Never Used  Substance and Sexual Activity  . Alcohol use: Yes    Alcohol/week: 0.0 standard drinks    Comment: Holidays  . Drug use: No  . Sexual activity: Yes    Partners: Female  Lifestyle  . Physical activity:    Days per week: 7 days    Minutes per session: 40 min  . Stress: Not at all  Relationships  . Social connections:    Talks on phone: More than three times a week    Gets together: Three times a week    Attends religious service: Never    Active member of club or organization: No    Attends meetings of clubs or  organizations: Never    Relationship status: Married  . Intimate partner violence:    Fear of current or ex partner: No    Emotionally abused: No    Physically abused: No    Forced sexual activity: No  Other Topics Concern  . Not on file  Social History Narrative   One son: [name] lives in [place]   One son Trip , died from possible suicide while living in [place]     Current Outpatient Medications:  .  ALPRAZolam (XANAX) 1 MG tablet, Take 0.5-1 tablets (0.5-1 mg total) by mouth at bedtime as needed for anxiety., Disp: 45 tablet, Rfl: 0 .  amphetamine-dextroamphetamine (ADDERALL) 15 MG tablet, Take 1 tablet by mouth 2 (two) times daily., Disp: 60 tablet, Rfl: 0 .  amphetamine-dextroamphetamine (ADDERALL) 15 MG tablet, Take 1 tablet by mouth 2 (two) times daily. Fill  09/09/2018, Disp: 60 tablet, Rfl: 0 .  amphetamine-dextroamphetamine (ADDERALL) 15 MG tablet, Take 1 tablet by mouth 2 (two) times daily., Disp: 60 tablet, Rfl: 0 .  atorvastatin (LIPITOR) 80 MG tablet, Take 1 tablet (80 mg total) by mouth daily., Disp: 90 tablet, Rfl: 1 .  buPROPion (WELLBUTRIN XL) 300 MG 24 hr tablet, Take 1 tablet (300 mg total) by mouth daily., Disp: 90 tablet, Rfl: 1 .  docusate sodium (COLACE) 100 MG capsule, Take 1 capsule (100 mg total) by mouth 2 (two) times daily., Disp: 30 capsule, Rfl: 0 .  fluoruracil (CARAC) 0.5 % cream, Apply to affected areas of scalp, foreheard and temples twice dialy for 4-5 days., Disp: , Rfl: 0 .  ibuprofen (MOTRIN IB) 200 MG tablet, Take 3 tablets (600 mg total) by mouth every 6 (six) hours as needed., Disp: 20 tablet, Rfl: 0 .  metFORMIN (GLUCOPHAGE) 500 MG tablet, Take 1 tablet (500 mg total) by mouth 2 (two) times daily., Disp: 180 tablet, Rfl: 1 .  nortriptyline (PAMELOR) 10 MG capsule, Take 2 capsules by mouth at bedtime., Disp: , Rfl:  .  omega-3 acid ethyl esters (LOVAZA) 1 g capsule, Take 2 capsules (2 g total) by mouth 2 (two) times daily., Disp: 480 capsule, Rfl:  1 .  omeprazole (PRILOSEC) 40 MG capsule, Take 1 capsule (40 mg total) by mouth daily., Disp: 90 capsule, Rfl: 0 .  ondansetron (ZOFRAN) 4 MG tablet, Take 1 tablet (4 mg total) by mouth every 8 (eight) hours as needed for nausea or vomiting., Disp: 8 tablet, Rfl: 0 .  oxyCODONE-acetaminophen (PERCOCET) 5-325 MG tablet, Take 1 tablet by mouth every 4 (four) hours as needed for severe pain., Disp: 8 tablet, Rfl: 0 .  ranitidine (ZANTAC) 150 MG tablet, Take 1 tablet (150 mg total) by mouth at bedtime., Disp: 90 tablet, Rfl: 1 .  sildenafil (REVATIO) 20 MG tablet,  Take 20 mg by mouth daily as needed., Disp: , Rfl:  .  tamsulosin (FLOMAX) 0.4 MG CAPS capsule, Take 1 capsule (0.4 mg total) by mouth daily., Disp: 10 capsule, Rfl: 0 .  valACYclovir (VALTREX) 500 MG tablet, TAKE ONE CAPLET BY MOUTH THREE TIMES DAILY, Disp: 90 tablet, Rfl: 1  No Known Allergies   ROS  Constitutional: Negative for fever or weight change.  Respiratory: Negative for cough and shortness of breath.   Cardiovascular: Negative for chest pain or palpitations.  Gastrointestinal: Positive for abdominal pain, no bowel changes.  Musculoskeletal: Negative for gait problem or joint swelling.  Skin: Negative for rash.  Neurological: Negative for dizziness or headache.  No other specific complaints in a complete review of systems (except as listed in HPI above).  Objective  Vitals:   08/13/18 1154  BP: 136/88  Pulse: 89  Resp: 14  Temp: 97.9 F (36.6 C)  TempSrc: Oral  SpO2: 98%  Weight: 223 lb 14.4 oz (101.6 kg)  Height: '5\' 9"'  (1.753 m)    Body mass index is 33.06 kg/m.  Physical Exam  Constitutional: Patient appears well-developed and well-nourished. Obese  No distress.  HEENT: head atraumatic, normocephalic, pupils equal and reactive to light, neck supple, throat within normal limits Cardiovascular: Normal rate, regular rhythm and normal heart sounds.  No murmur heard. No BLE edema. Pulmonary/Chest: Effort  normal and breath sounds normal. No respiratory distress. Abdominal: Soft.  There is no tenderness at this time, negative CVA tenderness  Psychiatric: Patient has a normal mood and affect. behavior is normal. Judgment and thought content normal.   Recent Results (from the past 2160 hour(s))  POCT HgB A1C     Status: Abnormal   Collection Time: 07/12/18 12:09 PM  Result Value Ref Range   Hemoglobin A1C 6.7 (A) 4.0 - 5.6 %   HbA1c POC (<> result, manual entry)     HbA1c, POC (prediabetic range)     HbA1c, POC (controlled diabetic range)    Lipase, blood     Status: None   Collection Time: 08/12/18  6:07 PM  Result Value Ref Range   Lipase 22 11 - 51 U/L    Comment: Performed at [facility], [address] [place], [place] [address]  Comprehensive metabolic panel     Status: Abnormal   Collection Time: 08/12/18  6:07 PM  Result Value Ref Range   Sodium 136 135 - 145 mmol/L   Potassium 4.4 3.5 - 5.1 mmol/L   Chloride 100 98 - 111 mmol/L   CO2 22 22 - 32 mmol/L   Glucose, Bld 138 (H) 70 - 99 mg/dL   BUN 16 8 - 23 mg/dL   Creatinine, Ser 1.26 (H) 0.61 - 1.24 mg/dL   Calcium 9.1 8.9 - 10.3 mg/dL   Total Protein 7.2 6.5 - 8.1 g/dL   Albumin 4.5 3.5 - 5.0 g/dL   AST 44 (H) 15 - 41 U/L   ALT 29 0 - 44 U/L   Alkaline Phosphatase 86 38 - 126 U/L   Total Bilirubin 0.7 0.3 - 1.2 mg/dL   GFR calc non Af Amer 57 (L) >60 mL/min   GFR calc Af Amer >60 >60 mL/min    Comment: (NOTE) The eGFR has been calculated using the CKD EPI equation. This calculation has not been validated in all clinical situations. eGFR's persistently <60 mL/min signify possible Chronic Kidney Disease.    Anion gap 14 5 - 15    Comment: Performed at  [facility], [address] [place], [place] [address]  CBC     Status: Abnormal   Collection Time: 08/12/18  6:07 PM  Result Value Ref Range   WBC 15.4 (H) 3.8 - 10.6 K/uL   RBC 4.82 4.40 - 5.90 MIL/uL   Hemoglobin 13.0 13.0 - 18.0 g/dL   HCT  38.5 (L) 40.0 - 52.0 %   MCV 79.9 (L) 80.0 - 100.0 fL   MCH 26.9 26.0 - 34.0 pg   MCHC 33.7 32.0 - 36.0 g/dL   RDW 16.1 (H) 11.5 - 14.5 %   Platelets 226 150 - 440 K/uL    Comment: Performed at [facility], [address] [place], [place] [address]  Urinalysis, Complete w Microscopic     Status: Abnormal   Collection Time: 08/12/18  6:07 PM  Result Value Ref Range   Color, Urine AMBER (A) YELLOW    Comment: BIOCHEMICALS MAY BE AFFECTED BY COLOR   APPearance HAZY (A) CLEAR   Specific Gravity, Urine 1.031 (H) 1.005 - 1.030   pH 5.0 5.0 - 8.0   Glucose, UA NEGATIVE NEGATIVE mg/dL   Hgb urine dipstick LARGE (A) NEGATIVE   Bilirubin Urine NEGATIVE NEGATIVE   Ketones, ur 20 (A) NEGATIVE mg/dL   Protein, ur 100 (A) NEGATIVE mg/dL   Nitrite NEGATIVE NEGATIVE   Leukocytes, UA NEGATIVE NEGATIVE   RBC / HPF >50 (H) 0 - 5 RBC/hpf   WBC, UA 6-10 0 - 5 WBC/hpf   Bacteria, UA RARE (A) NONE SEEN   Squamous Epithelial / LPF 0-5 0 - 5   Mucus PRESENT    Hyaline Casts, UA PRESENT     Comment: Performed at [facility], [address] [place], [place] [address]      PHQ2/9: Depression screen [facility] 2/9 08/13/2018 07/12/2018 04/06/2018 12/19/2016 10/11/2016  Decreased Interest 0 0 0 0 0  Down, Depressed, Hopeless 1 0 1 0 0  PHQ - 2 Score 1 0 1 0 0  Altered sleeping 0 0 0 - -  Tired, decreased energy '1 1 1 ' - -  Change in appetite 0 1 0 - -  Feeling bad or failure about yourself  0 0 0 - -  Trouble concentrating 0 0 - - -  Moving slowly or fidgety/restless 0 0 0 - -  Suicidal thoughts 0 0 0 - -  PHQ-9 Score '2 2 2 ' - -  Difficult doing work/chores Not difficult at all Not difficult at all Not difficult at all - -     Fall Risk: Fall Risk  08/13/2018 07/12/2018 04/06/2018 04/06/2018 01/03/2018  Falls in the past year? No No No No No  Number falls in past yr: - - - - -  Injury with Fall? - - - - -  [place]  1. Hydronephrosis with urinary obstruction  due to renal calculus  We made appointment to Urologist for Wednesday at 11:40, pain is under control with medication given by [facility], discussed importance of going back to [facility] if fever, chills.  2. Need for influenza vaccination  - Flu vaccine HIGH DOSE PF  3. Bilateral inguinal hernia without obstruction or gangrene, recurrence not specified  No symptom  4. Fatty liver  Discussed with patient

## 2018-08-15 ENCOUNTER — Ambulatory Visit: Payer: Medicare Other | Admitting: Urology

## 2018-08-15 ENCOUNTER — Other Ambulatory Visit: Payer: Self-pay

## 2018-08-15 ENCOUNTER — Encounter: Payer: Self-pay | Admitting: Urology

## 2018-08-15 VITALS — BP 136/86 | HR 108 | Ht 69.0 in | Wt 223.4 lb

## 2018-08-15 DIAGNOSIS — N201 Calculus of ureter: Secondary | ICD-10-CM | POA: Insufficient documentation

## 2018-08-15 NOTE — Progress Notes (Signed)
08/15/2018 11:56 AM   Shane Boyd Aug 19, 1951 [id number]  Referring provider: Steele Sizer, MD [address] [place],  [address]  CC: Right 65mm distal ureteral stone  HPI: I had the pleasure of seeing Shane Boyd in urology clinic today in consultation for right distal ureteral stone from Dr. Ancil Boozer.  He is a healthy 67 year old male who presented to the emergency department on September 8 with acute onset of severe, colicky, right flank and groin pain.  He was given fentanyl and Toradol which significantly improved his pain.  He was started on Flomax by the ER.  He has not strained his urine.  Since that time he denies any complaints of flank pain.  He denies any prior episodes of kidney stones.  He does have a positive family history of stones in his father.  He denies fevers, chills, gross hematuria.  He is a healthy eater and consumes lots of fruits and vegetables.  He does admit to not drinking a significant amount of fluids.   PMH: Past Medical History:  Diagnosis Date  . Anemia   . Anxiety   . Chronic insomnia   . Constipation   . Depression   . Diabetes mellitus without complication ([place])   . Encounter for long-term (current) use of other high-risk medications   . GERD (gastroesophageal reflux disease)   . History of malignant melanoma    Dr. Koleen Nimrod  . History of squamous cell carcinoma   . Hx of basal cell carcinoma   . Hyperlipidemia   . Leukocytosis   . Obesity   . Other male erectile dysfunction   . Vertigo    1-2x/yr    Surgical History: Past Surgical History:  Procedure Laterality Date  . ARM SKIN LESION BIOPSY / EXCISION Right 05/18/2017  . COLONOSCOPY    . COLONOSCOPY WITH PROPOFOL N/A 12/15/2016   Procedure: COLONOSCOPY WITH PROPOFOL;  Surgeon: Lucilla Lame, MD;  Location: [place];  Service: Endoscopy;  Laterality: N/A;  diabetic - oral meds  . NASAL SEPTUM SURGERY    . POLYPECTOMY  12/15/2016   Procedure:  POLYPECTOMY;  Surgeon: Lucilla Lame, MD;  Location: [place];  Service: Endoscopy;;  . SENTINEL NODE BIOPSY Right 05/18/2017   [facility]    Allergies: No Known Allergies  Family History: Family History  Problem Relation Age of Onset  . Diabetes Mother   . Heart disease Mother   . Hyperlipidemia Mother   . CVA Father   . Cancer Father        Colon  . Depression Father     Social History:  reports that he has never smoked. He has never used smokeless tobacco. He reports that he drinks alcohol. He reports that he does not use drugs.  ROS: Please see flowsheet from today's date for complete review of systems.  Physical Exam: BP 136/86   Pulse (!) 108   Ht 5\' 9"  (1.753 m)   Wt 223 lb 6.4 oz (101.3 kg)   BMI 32.99 kg/m    Constitutional:  Alert and oriented, No acute distress. Cardiovascular: No clubbing, cyanosis, or edema. Respiratory: Normal respiratory effort, no increased work of breathing. GI: Abdomen is soft, nontender, nondistended, no abdominal masses GU: No CVA tenderness Lymph: No cervical or inguinal lymphadenopathy. Skin: No rashes, bruises or suspicious lesions. Neurologic: Grossly intact, no focal deficits, moving all 4 extremities. Psychiatric: Normal mood and affect.   Pertinent Imaging: I have personally reviewed the CT stone protocol 08/12/2018.  There is a small 2 to 3 mm right distal ureteral stone with upstream hydronephrosis.  There is no other urolithiasis.  Assessment & Plan:   In summary, Shane Boyd is a 67 year old male with a 2-3 mm right distal ureteral stone on CT 08/12/2018 now with complete resolution of symptoms.  He may have passed his stone already.  We discussed various treatment options for urolithiasis including observation with or without medical expulsive therapy, shockwave lithotripsy (SWL), ureteroscopy and laser lithotripsy with stent placement, and percutaneous nephrolithotomy.  We discussed that management is based on stone  size, location, density, patient co-morbidities, and patient preference.   Stones <80mm in size have a >80% spontaneous passage rate. Data surrounding the use of tamsulosin for medical expulsive therapy is controversial, but meta analyses suggests it is most efficacious for distal stones between 5-71mm in size. Possible side effects include dizziness/lightheadedness, and retrograde ejaculation.  SWL has a lower stone free rate in a single procedure, but also a lower complication rate compared to ureteroscopy and avoids a stent and associated stent related symptoms. Possible complications include renal hematoma, steinstrasse, and need for additional treatment.  Ureteroscopy with laser lithotripsy and stent placement has a higher stone free rate than SWL in a single procedure, however increased complication rate including possible infection, ureteral injury, bleeding, and stent related morbidity. Common stent related symptoms include dysuria, urgency/frequency, and flank pain.  After an extensive discussion of the risks and benefits of the above treatment options, the patient would like to proceed with medical expulsive therapy.  A urine strainer was provided.  He will follow-up in 4 weeks with a renal ultrasound to confirm passage of his stone.  If he catches a stone before then, he can cancel the ultrasound.  [place], [facility] [address], [place], [place] [address] [phone]

## 2018-08-21 ENCOUNTER — Encounter: Payer: Self-pay | Admitting: Family Medicine

## 2018-08-27 ENCOUNTER — Encounter: Payer: Self-pay | Admitting: Family Medicine

## 2018-09-06 ENCOUNTER — Encounter: Payer: Self-pay | Admitting: Family Medicine

## 2018-09-19 ENCOUNTER — Encounter: Payer: Self-pay | Admitting: Family Medicine

## 2018-10-17 ENCOUNTER — Encounter: Payer: Self-pay | Admitting: Family Medicine

## 2018-10-17 ENCOUNTER — Ambulatory Visit: Payer: Medicare Other | Admitting: Family Medicine

## 2018-10-17 VITALS — BP 128/76 | HR 114 | Temp 97.6°F | Resp 16 | Ht 69.0 in | Wt 219.0 lb

## 2018-10-17 DIAGNOSIS — H9193 Unspecified hearing loss, bilateral: Secondary | ICD-10-CM | POA: Diagnosis not present

## 2018-10-17 DIAGNOSIS — E1140 Type 2 diabetes mellitus with diabetic neuropathy, unspecified: Secondary | ICD-10-CM

## 2018-10-17 DIAGNOSIS — L84 Corns and callosities: Secondary | ICD-10-CM

## 2018-10-17 DIAGNOSIS — Z79899 Other long term (current) drug therapy: Secondary | ICD-10-CM

## 2018-10-17 DIAGNOSIS — E1129 Type 2 diabetes mellitus with other diabetic kidney complication: Secondary | ICD-10-CM

## 2018-10-17 DIAGNOSIS — E11628 Type 2 diabetes mellitus with other skin complications: Secondary | ICD-10-CM | POA: Insufficient documentation

## 2018-10-17 DIAGNOSIS — K760 Fatty (change of) liver, not elsewhere classified: Secondary | ICD-10-CM

## 2018-10-17 DIAGNOSIS — K219 Gastro-esophageal reflux disease without esophagitis: Secondary | ICD-10-CM

## 2018-10-17 DIAGNOSIS — D509 Iron deficiency anemia, unspecified: Secondary | ICD-10-CM

## 2018-10-17 DIAGNOSIS — N521 Erectile dysfunction due to diseases classified elsewhere: Secondary | ICD-10-CM | POA: Diagnosis not present

## 2018-10-17 DIAGNOSIS — E1169 Type 2 diabetes mellitus with other specified complication: Secondary | ICD-10-CM | POA: Diagnosis not present

## 2018-10-17 DIAGNOSIS — E785 Hyperlipidemia, unspecified: Secondary | ICD-10-CM

## 2018-10-17 DIAGNOSIS — R51 Headache: Secondary | ICD-10-CM

## 2018-10-17 DIAGNOSIS — R809 Proteinuria, unspecified: Secondary | ICD-10-CM

## 2018-10-17 DIAGNOSIS — F411 Generalized anxiety disorder: Secondary | ICD-10-CM

## 2018-10-17 DIAGNOSIS — E782 Mixed hyperlipidemia: Secondary | ICD-10-CM

## 2018-10-17 DIAGNOSIS — R519 Headache, unspecified: Secondary | ICD-10-CM

## 2018-10-17 DIAGNOSIS — F339 Major depressive disorder, recurrent, unspecified: Secondary | ICD-10-CM

## 2018-10-17 LAB — POCT GLYCOSYLATED HEMOGLOBIN (HGB A1C): HbA1c POC (<> result, manual entry): 6.5 % (ref 4.0–5.6)

## 2018-10-17 MED ORDER — AMPHETAMINE-DEXTROAMPHETAMINE 15 MG PO TABS: 15.0000 mg | ORAL_TABLET | Freq: Two times a day (BID) | ORAL | 0 refills | Status: DC

## 2018-10-17 MED ORDER — BUPROPION HCL ER (XL) 300 MG PO TB24: 300.0000 mg | ORAL_TABLET | Freq: Every day | ORAL | 1 refills | Status: DC

## 2018-10-17 MED ORDER — OMEPRAZOLE 40 MG PO CPDR: 40.0000 mg | DELAYED_RELEASE_CAPSULE | Freq: Every day | ORAL | 0 refills | Status: DC

## 2018-10-17 MED ORDER — ALPRAZOLAM 1 MG PO TABS: 0.5000 mg | ORAL_TABLET | Freq: Every evening | ORAL | 0 refills | Status: DC | PRN

## 2018-10-17 MED ORDER — METFORMIN HCL 500 MG PO TABS: 500.0000 mg | ORAL_TABLET | Freq: Two times a day (BID) | ORAL | 1 refills | Status: DC

## 2018-10-17 MED ORDER — ATORVASTATIN CALCIUM 80 MG PO TABS: 80.0000 mg | ORAL_TABLET | Freq: Every day | ORAL | 1 refills | Status: DC

## 2018-10-17 MED ORDER — OMEGA-3-ACID ETHYL ESTERS 1 G PO CAPS: 2.0000 g | ORAL_CAPSULE | Freq: Two times a day (BID) | ORAL | 1 refills | Status: DC

## 2018-10-17 NOTE — Progress Notes (Signed)
Name: Shane Boyd.   MRN: [id number]    DOB: 30-Nov-1951   Date:10/17/2018       Progress Note  Subjective  Chief Complaint  Chief Complaint  Patient presents with  . Follow-up    3 mth f/u  . Diabetes  . Anxiety  . Depression  . Dyslipidemia  . Gastroesophageal Reflux    HPI  GERD: he is now on Omeprazole in am and was taking Ranitidine at night, but had two episodes of severe symptoms and had to go back to bid, he states he will try weaning self off, he states he needs to take it BID because the symptoms gets severe at night. Try to take PPI at night and see if symptoms improve without having to take it BID   Dyslipidemia : he is taking Lovaza BID and atorvastatin, he states tolerating medication well, no myalgia.   DMII with ED: he is taking Metformin twice daily, denies polyphagia, polydipsia or polyuria. He has ED . His hgbA1C has been at goal, today at 6.5% he has a history of microalbuminuria, he is off ACE because of dizziness.  Dizziness: he has episodes of dizziness that can happen any time of the day, associated with nausea and vomiting, it can last daily. It is described as the room spinning. He denies tinnitus or associated headaches. Seen by Dr. Melrose Nakayama in the past and was given Pamelor but episodes still present, but no headache. He does not want to see ENT, he is doing home Epley maneuver  ED: taking generic Viagra and is doing well, denies side effects of medication, stable  Major Depressive Disorder: he is doing better now, he was in survival mode when getting treated for [place] skin cancerSummer 2018, and feels grateful now that he is doing well. Hestopped Celexa months ago because of side effects. Denies suicidal ideation. Phq9 is stable at 2.  Still on Wellbutrin and Adderal and today mild tachycardia, but usually no side effects.   GAD: discussed importance of going down on BZD dose. He still feels anxious, denies recent panic attack., fidgety,he  is now on Alprazolam 1 mg to take prn and #45 pills to last 3 monthssince 01/20019 and is doing well.  Hearing loss: he was advised to have hearing aid, but he decided to by a [place] hear phones.    Patient Active Problem List   Diagnosis Date Noted  . 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 Right ureteral stone 08/15/2018  . Bilateral inguinal hernia without obstruction or gangrene 08/13/2018  . Fatty liver 08/13/2018  . Difficulty balancing 05/16/2017  . Special screening for malignant neoplasms, colon   . Benign neoplasm of ascending colon   . Benign neoplasm of transverse colon   . Hearing loss, sensorineural, unilateral 10/21/2016  . Dyslipidemia with low high density lipoprotein (HDL) cholesterol with hypertriglyceridemia due to type 2 diabetes mellitus ([place]) 09/16/2016  . GAD (generalized anxiety disorder) 09/16/2016  . Genital herpes 06/08/2016  . Dizziness 03/09/2016  . History of nonmelanoma skin cancer 09/30/2015  . Neck pain 07/29/2015  . Anxiety 07/19/2015  . Insomnia, persistent 07/19/2015  . CN (constipation) 07/19/2015  . 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 Dyslipidemia 07/19/2015  . Gastric reflux 07/19/2015  . History of shingles 07/19/2015  . History of basal cell cancer 07/19/2015  . H/O Malignant melanoma 07/19/2015  . Personal history of malignant neoplasm of head and neck 07/19/2015  . Chronic  recurrent major depressive disorder ([place]) 07/19/2015  . Obesity (BMI 30.0-34.9) 07/19/2015  . ED (erectile dysfunction) 07/19/2015    Past Surgical History:  Procedure Laterality Date  . ARM SKIN LESION BIOPSY / EXCISION Right 05/18/2017  . COLONOSCOPY    . COLONOSCOPY WITH PROPOFOL N/A 12/15/2016   Procedure: COLONOSCOPY WITH PROPOFOL;  Surgeon: Lucilla Lame, MD;  Location: [facility];  Service: Endoscopy;  Laterality: N/A;  diabetic - oral meds  . NASAL SEPTUM SURGERY    .  POLYPECTOMY  12/15/2016   Procedure: POLYPECTOMY;  Surgeon: Lucilla Lame, MD;  Location: [facility];  Service: Endoscopy;;  . SENTINEL NODE BIOPSY Right 05/18/2017   [facility]    Family History  Problem Relation Age of Onset  . Diabetes Mother   . Heart disease Mother   . Hyperlipidemia Mother   . CVA Father   . Cancer Father        Colon  . Depression Father     Social History   Socioeconomic History  . Marital status: Married    Spouse name: Neoma Laming  . Number of children: 1  . Years of education: Not on file  . Highest education level: Master's degree (e.g., MA, MS, MEng, MEd, MSW, MBA)  Occupational History  . Occupation: retired  Scientific laboratory technician  . Financial resource strain: Not hard at all  . Food insecurity:    Worry: Never true    Inability: Never true  . Transportation needs:    Medical: No    Non-medical: No  Tobacco Use  . Smoking status: Never Smoker  . Smokeless tobacco: Never Used  Substance and Sexual Activity  . Alcohol use: Yes    Alcohol/week: 0.0 standard drinks    Comment: Holidays  . Drug use: No  . Sexual activity: Yes    Partners: Female  Lifestyle  . Physical activity:    Days per week: 7 days    Minutes per session: 40 min  . Stress: Not at all  Relationships  . Social connections:    Talks on phone: More than three times a week    Gets together: Three times a week    Attends religious service: Never    Active member of club or organization: No    Attends meetings of clubs or organizations: Never    Relationship status: Married  . Intimate partner violence:    Fear of current or ex partner: No    Emotionally abused: No    Physically abused: No    Forced sexual activity: No  Other Topics Concern  . Not on file  Social History Narrative   One son: [name] lives in [place]   One son Trip , died from possible suicide while living in [place]     Current Outpatient Medications:  .  ALPRAZolam (XANAX) 1 MG tablet, Take 0.5-1 tablets  (0.5-1 mg total) by mouth at bedtime as needed for anxiety., Disp: 45 tablet, Rfl: 0 .  amphetamine-dextroamphetamine (ADDERALL) 15 MG tablet, Take 1 tablet by mouth 2 (two) times daily., Disp: 60 tablet, Rfl: 0 .  amphetamine-dextroamphetamine (ADDERALL) 15 MG tablet, Take 1 tablet by mouth 2 (two) times daily., Disp: 60 tablet, Rfl: 0 .  amphetamine-dextroamphetamine (ADDERALL) 15 MG tablet, Take 1 tablet by mouth 2 (two) times daily., Disp: 60 tablet, Rfl: 0 .  atorvastatin (LIPITOR) 80 MG tablet, Take 1 tablet (80 mg total) by mouth daily., Disp: 90 tablet, Rfl: 1 .  buPROPion (WELLBUTRIN XL) 300 MG 24 hr  tablet, Take 1 tablet (300 mg total) by mouth daily., Disp: 90 tablet, Rfl: 1 .  docusate sodium (COLACE) 100 MG capsule, Take 1 capsule (100 mg total) by mouth 2 (two) times daily., Disp: 30 capsule, Rfl: 0 .  ibuprofen (MOTRIN IB) 200 MG tablet, Take 3 tablets (600 mg total) by mouth every 6 (six) hours as needed., Disp: 20 tablet, Rfl: 0 .  metFORMIN (GLUCOPHAGE) 500 MG tablet, Take 1 tablet (500 mg total) by mouth 2 (two) times daily., Disp: 180 tablet, Rfl: 1 .  nortriptyline (PAMELOR) 10 MG capsule, Take 2 capsules by mouth at bedtime., Disp: , Rfl:  .  omega-3 acid ethyl esters (LOVAZA) 1 g capsule, Take 2 capsules (2 g total) by mouth 2 (two) times daily., Disp: 480 capsule, Rfl: 1 .  omeprazole (PRILOSEC) 40 MG capsule, Take 1 capsule (40 mg total) by mouth daily., Disp: 90 capsule, Rfl: 0 .  ranitidine (ZANTAC) 150 MG tablet, Take 1 tablet (150 mg total) by mouth at bedtime., Disp: 90 tablet, Rfl: 1 .  sildenafil (REVATIO) 20 MG tablet, Take 20 mg by mouth daily as needed., Disp: , Rfl:  .  valACYclovir (VALTREX) 500 MG tablet, TAKE ONE CAPLET BY MOUTH THREE TIMES DAILY, Disp: 90 tablet, Rfl: 1 .  fluoruracil (CARAC) 0.5 % cream, Apply to affected areas of scalp, foreheard and temples twice dialy for 4-5 days., Disp: , Rfl: 0  No Known Allergies  I personally reviewed active problem  list, medication list, allergies, family history, social history with the patient/caregiver today.   ROS  Constitutional: Negative for fever or weight change.  Respiratory: Negative for cough and shortness of breath.   Cardiovascular: Negative for chest pain or palpitations.  Gastrointestinal: Negative for abdominal pain, no bowel changes.  Musculoskeletal: Negative for gait problem or joint swelling.  Skin: Negative for rash.  Neurological: positive  for intermittent episodes of dizziness but no recent  headache.  No other specific complaints in a complete review of systems (except as listed in HPI above).  Objective  Vitals:   10/17/18 1142  BP: 128/76  Pulse: (!) 114  Resp: 16  Temp: 97.6 F (36.4 C)  TempSrc: Oral  SpO2: 99%  Weight: 219 lb (99.3 kg)  Height: '5\' 9"'  (1.753 m)    Body mass index is 32.34 kg/m.  Physical Exam  Constitutional: Patient appears well-developed and well-nourished. Obese  No distress.  HEENT: head atraumatic, normocephalic, pupils equal and reactive to light, neck supple, throat within normal limits Cardiovascular: Normal rate, regular rhythm and normal heart sounds.  No murmur heard. No BLE edema. Pulmonary/Chest: Effort normal and breath sounds normal. No respiratory distress. Abdominal: Soft.  There is no tenderness. Psychiatric: Patient has a normal mood and affect. behavior is normal. Judgment and thought content normal.  Recent Results (from the past 2160 hour(s))  Lipase, blood     Status: None   Collection Time: 08/12/18  6:07 PM  Result Value Ref Range   Lipase 22 11 - 51 U/L    Comment: Performed at [facility], [address] [place], [place] [address]  Comprehensive metabolic panel     Status: Abnormal   Collection Time: 08/12/18  6:07 PM  Result Value Ref Range   Sodium 136 135 - 145 mmol/L   Potassium 4.4 3.5 - 5.1 mmol/L   Chloride 100 98 - 111 mmol/L   CO2 22 22 - 32 mmol/L   Glucose, Bld 138 (H) 70 - 99  mg/dL  BUN 16 8 - 23 mg/dL   Creatinine, Ser 1.26 (H) 0.61 - 1.24 mg/dL   Calcium 9.1 8.9 - 10.3 mg/dL   Total Protein 7.2 6.5 - 8.1 g/dL   Albumin 4.5 3.5 - 5.0 g/dL   AST 44 (H) 15 - 41 U/L   ALT 29 0 - 44 U/L   Alkaline Phosphatase 86 38 - 126 U/L   Total Bilirubin 0.7 0.3 - 1.2 mg/dL   GFR calc non Af Amer 57 (L) >60 mL/min   GFR calc Af Amer >60 >60 mL/min    Comment: (NOTE) The eGFR has been calculated using the CKD EPI equation. This calculation has not been validated in all clinical situations. eGFR's persistently <60 mL/min signify possible Chronic Kidney Disease.    Anion gap 14 5 - 15    Comment: Performed at [facility], [address] [place], [place] [address]  CBC     Status: Abnormal   Collection Time: 08/12/18  6:07 PM  Result Value Ref Range   WBC 15.4 (H) 3.8 - 10.6 K/uL   RBC 4.82 4.40 - 5.90 MIL/uL   Hemoglobin 13.0 13.0 - 18.0 g/dL   HCT 38.5 (L) 40.0 - 52.0 %   MCV 79.9 (L) 80.0 - 100.0 fL   MCH 26.9 26.0 - 34.0 pg   MCHC 33.7 32.0 - 36.0 g/dL   RDW 16.1 (H) 11.5 - 14.5 %   Platelets 226 150 - 440 K/uL    Comment: Performed at [facility], [address] [place], [place] [address]  Urinalysis, Complete w Microscopic     Status: Abnormal   Collection Time: 08/12/18  6:07 PM  Result Value Ref Range   Color, Urine AMBER (A) YELLOW    Comment: BIOCHEMICALS MAY BE AFFECTED BY COLOR   APPearance HAZY (A) CLEAR   Specific Gravity, Urine 1.031 (H) 1.005 - 1.030   pH 5.0 5.0 - 8.0   Glucose, UA NEGATIVE NEGATIVE mg/dL   Hgb urine dipstick LARGE (A) NEGATIVE   Bilirubin Urine NEGATIVE NEGATIVE   Ketones, ur 20 (A) NEGATIVE mg/dL   Protein, ur 100 (A) NEGATIVE mg/dL   Nitrite NEGATIVE NEGATIVE   Leukocytes, UA NEGATIVE NEGATIVE   RBC / HPF >50 (H) 0 - 5 RBC/hpf   WBC, UA 6-10 0 - 5 WBC/hpf   Bacteria, UA RARE (A) NONE SEEN   Squamous Epithelial / LPF 0-5 0 - 5   Mucus PRESENT    Hyaline Casts, UA PRESENT     Comment: Performed  at [facility], [address] [place],  [address]  POCT HgB A1C     Status: Abnormal   Collection Time: 10/17/18 12:38 PM  Result Value Ref Range   Hemoglobin A1C     HbA1c POC (<> result, manual entry) 6.5 4.0 - 5.6 %   HbA1c, POC (prediabetic range)     HbA1c, POC (controlled diabetic range)      Diabetic Foot Exam: Diabetic Foot Exam - Simple   Simple Foot Form Diabetic Foot exam was performed with the following findings:  Yes 10/17/2018 12:35 PM  Visual Inspection See comments:  Yes Sensation Testing Intact to touch and monofilament testing bilaterally:  Yes Pulse Check Posterior Tibialis and Dorsalis pulse intact bilaterally:  Yes Comments Callus formation big toe bilaterally       PHQ2/9: Depression screen [facility] 2/9 10/17/2018 08/13/2018 07/12/2018 04/06/2018 12/19/2016  Decreased Interest 0 0 0 0 0  Down, Depressed, Hopeless 0 1  0 1 0  PHQ - 2 Score 0 1 0 1 0  Altered sleeping 0 0 0 0 -  Tired, decreased energy '2 1 1 1 ' -  Change in appetite 0 0 1 0 -  Feeling bad or failure about yourself  0 0 0 0 -  Trouble concentrating 0 0 0 - -  Moving slowly or fidgety/restless 0 0 0 0 -  Suicidal thoughts 0 0 0 0 -  PHQ-9 Score '2 2 2 2 ' -  Difficult doing work/chores Not difficult at all Not difficult at all Not difficult at all Not difficult at all -    Fall Risk: Fall Risk  10/17/2018 08/13/2018 07/12/2018 04/06/2018 04/06/2018  Falls in the past year? 0 No No No No  Number falls in past yr: - - - - -  Injury with Fall? - - - - -  Comment - - - - -    Functional Status Survey: Is the patient deaf or have difficulty hearing?: Yes Does the patient have difficulty seeing, even when wearing glasses/contacts?: No Does the patient have difficulty concentrating, remembering, or making decisions?: No Does the patient have difficulty walking or climbing stairs?: No Does the patient have difficulty dressing or bathing?: No Does the patient have difficulty doing errands  alone such as visiting a doctor's office or shopping?: No   Assessment & Plan  1. 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  Dispense: 180 tablet; Refill: 1 - POCT HgB A1C  2. Dyslipidemia with low high density lipoprotein (HDL) cholesterol 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)  - omega-3 acid ethyl esters (LOVAZA) 1 g capsule; Take 2 capsules (2 g total) by mouth 2 (two) times daily.  Dispense: 480 capsule; Refill: 1  3. Bilateral hearing loss, unspecified hearing loss type   4. GAD (generalized anxiety disorder)  - ALPRAZolam (XANAX) 1 MG tablet; Take 0.5-1 tablets (0.5-1 mg total) by mouth at bedtime as needed for anxiety.  Dispense: 45 tablet; Refill: 0  5. Intractable episodic headache, unspecified headache type  Seen by Dr. Melrose Nakayama  6. Fatty liver   7. Iron deficiency anemia, unspecified iron deficiency anemia type  - CBC with Differential/Platelet - Iron, TIBC and Ferritin Panel  8. Chronic recurrent major depressive disorder (HCC)  - amphetamine-dextroamphetamine (ADDERALL) 15 MG tablet; Take 1 tablet by mouth 2 (two) times daily.  Dispense: 60 tablet; Refill: 0 - amphetamine-dextroamphetamine (ADDERALL) 15 MG tablet; Take 1 tablet by mouth 2 (two) times daily.  Dispense: 60 tablet; Refill: 0 - amphetamine-dextroamphetamine (ADDERALL) 15 MG tablet; Take 1 tablet by mouth 2 (two) times daily.  Dispense: 60 tablet; Refill: 0 - buPROPion (WELLBUTRIN XL) 300 MG 24 hr tablet; Take 1 tablet (300 mg total) by mouth daily.  Dispense: 90 tablet; Refill: 1  9. Long-term use of high-risk medication  - COMPLETE METABOLIC PANEL WITH GFR  10. Diabetes mellitus with microalbuminuria ([place])   11. Dyslipidemia  - atorvastatin (LIPITOR) 80 MG tablet; Take 1 tablet (80 mg total) by mouth daily.  Dispense: 90 tablet; Refill:  1  12. Gastric reflux  - omeprazole (PRILOSEC) 40 MG capsule; Take 1 capsule (40 mg total) by mouth daily.  Dispense: 90 capsule; Refill: 0   13. 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

## 2018-10-23 ENCOUNTER — Other Ambulatory Visit: Payer: Self-pay | Admitting: Family Medicine

## 2018-10-23 ENCOUNTER — Encounter: Payer: Self-pay | Admitting: Family Medicine

## 2018-10-23 MED ORDER — FAMOTIDINE 20 MG PO TABS: 20.0000 mg | ORAL_TABLET | Freq: Every day | ORAL | 0 refills | Status: DC | PRN

## 2018-11-06 LAB — HM DIABETES EYE EXAM

## 2018-11-07 ENCOUNTER — Encounter: Payer: Self-pay | Admitting: Family Medicine

## 2018-11-09 ENCOUNTER — Encounter: Payer: Self-pay | Admitting: Family Medicine

## 2018-11-10 ENCOUNTER — Other Ambulatory Visit: Payer: Self-pay | Admitting: Family Medicine

## 2018-11-10 DIAGNOSIS — A6000 Herpesviral infection of urogenital system, unspecified: Secondary | ICD-10-CM

## 2018-11-10 MED ORDER — VALACYCLOVIR HCL 500 MG PO TABS: ORAL_TABLET | ORAL | 1 refills | Status: DC

## 2019-01-18 ENCOUNTER — Ambulatory Visit: Payer: Medicare Other | Admitting: Family Medicine

## 2019-01-22 ENCOUNTER — Ambulatory Visit: Payer: Medicare Other | Admitting: Family Medicine

## 2019-01-22 ENCOUNTER — Encounter: Payer: Self-pay | Admitting: Family Medicine

## 2019-01-22 VITALS — BP 122/68 | HR 97 | Temp 98.0°F | Resp 16 | Ht 69.0 in | Wt 214.8 lb

## 2019-01-22 DIAGNOSIS — N521 Erectile dysfunction due to diseases classified elsewhere: Secondary | ICD-10-CM | POA: Diagnosis not present

## 2019-01-22 DIAGNOSIS — F339 Major depressive disorder, recurrent, unspecified: Secondary | ICD-10-CM

## 2019-01-22 DIAGNOSIS — K219 Gastro-esophageal reflux disease without esophagitis: Secondary | ICD-10-CM

## 2019-01-22 DIAGNOSIS — E1169 Type 2 diabetes mellitus with other specified complication: Secondary | ICD-10-CM | POA: Diagnosis not present

## 2019-01-22 DIAGNOSIS — E1140 Type 2 diabetes mellitus with diabetic neuropathy, unspecified: Secondary | ICD-10-CM | POA: Diagnosis not present

## 2019-01-22 DIAGNOSIS — H9193 Unspecified hearing loss, bilateral: Secondary | ICD-10-CM | POA: Diagnosis not present

## 2019-01-22 DIAGNOSIS — D72829 Elevated white blood cell count, unspecified: Secondary | ICD-10-CM

## 2019-01-22 DIAGNOSIS — E785 Hyperlipidemia, unspecified: Secondary | ICD-10-CM

## 2019-01-22 DIAGNOSIS — E782 Mixed hyperlipidemia: Secondary | ICD-10-CM

## 2019-01-22 LAB — POCT GLYCOSYLATED HEMOGLOBIN (HGB A1C): HbA1c, POC (controlled diabetic range): 6.5 % (ref 0.0–7.0)

## 2019-01-22 MED ORDER — OMEPRAZOLE 40 MG PO CPDR: 40.0000 mg | DELAYED_RELEASE_CAPSULE | Freq: Every day | ORAL | 0 refills | Status: DC

## 2019-01-22 MED ORDER — AMPHETAMINE-DEXTROAMPHETAMINE 15 MG PO TABS: 15.0000 mg | ORAL_TABLET | Freq: Two times a day (BID) | ORAL | 0 refills | Status: DC

## 2019-01-22 NOTE — Progress Notes (Signed)
Name: Shane Boyd.   MRN: [id number]    DOB: 08-Aug-1951   Date:01/22/2019       Progress Note  Subjective  Chief Complaint  Chief Complaint  Patient presents with  . Medication Refill  . Depression  . Diabetes  . Gastroesophageal Reflux    He is unable to tolerate raw onion now, takes medication daily to control symptoms takes Tum for break thru reflux   . Erectile Dysfunction  . GAD  . Dyslipidemia    HPI  GERD: he is still taking Omeprazole in am, and taking two Famotidines at night prn and is doing well at thsi time, controlling symptoms  Dyslipidemia : he is taking Lovaza BID and atorvastatin, he states tolerating medication well, no myalgia. We will recheck labs next visit.   DMII with ED: he is taking Metformin twice daily, denies polyphagia, polydipsia or polyuria. He has ED . His hgbA1C has been at goal, twice at 6.5 %, he states not very compliant through the holidays, but is eating a lot of fruit and vegetables and is doing well.   Dizziness: he has episodes of dizziness that can happen any time of the day, associated with nausea and vomiting, it can last daily. It is described as the room spinning. He denies tinnitus or associated headaches. Seen by Dr. Melrose Nakayama in the past and was given Pamelor but episodes still present, but no headache. He does not want to see ENT, he is doing home Epley maneuver. No episodes since last visit.   ED: taking generic Viagra and is doing well, denies side effects of medication, stable. He will check Cover my meds.  Major Depressive Disorder: he is doing well , major depression in remission, phq9 negative, off citalopram, having a lot of energy, new hobby building his own subwoofer   GAD: discussed importance of going down on BZD dose. He still feels anxious, denies recent panic attack., fidgety,he is now on Alprazolam 1 mg to take prn and #45 pills to last 3 monthssince 01/20019 and now he is only taking it prn, and still has  medication at home, we will try to go down even further on his next visit   Hearing loss: he was advised to have hearing aid, but he decided to by a [place] hear phones.  Patient Active Problem List   Diagnosis Date Noted  . 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 Right ureteral stone 08/15/2018  . Bilateral inguinal hernia without obstruction or gangrene 08/13/2018  . Fatty liver 08/13/2018  . Difficulty balancing 05/16/2017  . Special screening for malignant neoplasms, colon   . Benign neoplasm of ascending colon   . Benign neoplasm of transverse colon   . Hearing loss, sensorineural, unilateral 10/21/2016  . Dyslipidemia with low high density lipoprotein (HDL) cholesterol with hypertriglyceridemia due to type 2 diabetes mellitus ([place]) 09/16/2016  . GAD (generalized anxiety disorder) 09/16/2016  . Genital herpes 06/08/2016  . Dizziness 03/09/2016  . History of nonmelanoma skin cancer 09/30/2015  . Neck pain 07/29/2015  . Anxiety 07/19/2015  . Insomnia, persistent 07/19/2015  . CN (constipation) 07/19/2015  . 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 Dyslipidemia 07/19/2015  . Gastric reflux 07/19/2015  . History of shingles 07/19/2015  . History of basal cell cancer 07/19/2015  . H/O Malignant melanoma 07/19/2015  . Personal history of malignant neoplasm of head and neck 07/19/2015  . Chronic recurrent major depressive disorder ([place]) 07/19/2015  .  Obesity (BMI 30.0-34.9) 07/19/2015  . ED (erectile dysfunction) 07/19/2015    Past Surgical History:  Procedure Laterality Date  . ARM SKIN LESION BIOPSY / EXCISION Right 05/18/2017  . COLONOSCOPY    . COLONOSCOPY WITH PROPOFOL N/A 12/15/2016   Procedure: COLONOSCOPY WITH PROPOFOL;  Surgeon: Lucilla Lame, MD;  Location: [facility];  Service: Endoscopy;  Laterality: N/A;  diabetic - oral meds  . NASAL SEPTUM SURGERY    . POLYPECTOMY  12/15/2016   Procedure:  POLYPECTOMY;  Surgeon: Lucilla Lame, MD;  Location: [facility];  Service: Endoscopy;;  . SENTINEL NODE BIOPSY Right 05/18/2017   [facility]    Family History  Problem Relation Age of Onset  . Diabetes Mother   . Heart disease Mother   . Hyperlipidemia Mother   . CVA Father   . Cancer Father        Colon  . Depression Father     Social History   Socioeconomic History  . Marital status: Married    Spouse name: Neoma Laming  . Number of children: 1  . Years of education: Not on file  . Highest education level: Master's degree (e.g., MA, MS, MEng, MEd, MSW, MBA)  Occupational History  . Occupation: retired  Scientific laboratory technician  . Financial resource strain: Not hard at all  . Food insecurity:    Worry: Never true    Inability: Never true  . Transportation needs:    Medical: No    Non-medical: No  Tobacco Use  . Smoking status: Never Smoker  . Smokeless tobacco: Never Used  Substance and Sexual Activity  . Alcohol use: Yes    Alcohol/week: 0.0 standard drinks    Comment: Holidays  . Drug use: No  . Sexual activity: Yes    Partners: Female  Lifestyle  . Physical activity:    Days per week: 7 days    Minutes per session: 40 min  . Stress: Not at all  Relationships  . Social connections:    Talks on phone: More than three times a week    Gets together: Three times a week    Attends religious service: Never    Active member of club or organization: No    Attends meetings of clubs or organizations: Never    Relationship status: Married  . Intimate partner violence:    Fear of current or ex partner: No    Emotionally abused: No    Physically abused: No    Forced sexual activity: No  Other Topics Concern  . Not on file  Social History Narrative   One son: [name] lives in [place]   One son Trip , died from possible suicide while living in [place]     Current Outpatient Medications:  .  ALPRAZolam (XANAX) 1 MG tablet, Take 0.5-1 tablets (0.5-1 mg total) by mouth at bedtime  as needed for anxiety., Disp: 45 tablet, Rfl: 0 .  amphetamine-dextroamphetamine (ADDERALL) 15 MG tablet, Take 1 tablet by mouth 2 (two) times daily., Disp: 60 tablet, Rfl: 0 .  amphetamine-dextroamphetamine (ADDERALL) 15 MG tablet, Take 1 tablet by mouth 2 (two) times daily., Disp: 60 tablet, Rfl: 0 .  amphetamine-dextroamphetamine (ADDERALL) 15 MG tablet, Take 1 tablet by mouth 2 (two) times daily., Disp: 60 tablet, Rfl: 0 .  atorvastatin (LIPITOR) 80 MG tablet, Take 1 tablet (80 mg total) by mouth daily., Disp: 90 tablet, Rfl: 1 .  buPROPion (WELLBUTRIN XL) 300 MG 24 hr tablet, Take 1 tablet (300 mg total) by  mouth daily., Disp: 90 tablet, Rfl: 1 .  docusate sodium (COLACE) 100 MG capsule, Take 1 capsule (100 mg total) by mouth 2 (two) times daily., Disp: 30 capsule, Rfl: 0 .  famotidine (PEPCID) 20 MG tablet, Take 1 tablet (20 mg total) by mouth daily as needed for heartburn or indigestion., Disp: 180 tablet, Rfl: 0 .  ibuprofen (MOTRIN IB) 200 MG tablet, Take 3 tablets (600 mg total) by mouth every 6 (six) hours as needed., Disp: 20 tablet, Rfl: 0 .  metFORMIN (GLUCOPHAGE) 500 MG tablet, Take 1 tablet (500 mg total) by mouth 2 (two) times daily., Disp: 180 tablet, Rfl: 1 .  omega-3 acid ethyl esters (LOVAZA) 1 g capsule, Take 2 capsules (2 g total) by mouth 2 (two) times daily., Disp: 480 capsule, Rfl: 1 .  omeprazole (PRILOSEC) 40 MG capsule, Take 1 capsule (40 mg total) by mouth daily., Disp: 90 capsule, Rfl: 0 .  sildenafil (REVATIO) 20 MG tablet, Take 20 mg by mouth daily as needed., Disp: , Rfl:  .  valACYclovir (VALTREX) 500 MG tablet, TAKE ONE CAPLET BY MOUTH THREE TIMES DAILY, Disp: 90 tablet, Rfl: 1 .  fluoruracil (CARAC) 0.5 % cream, Apply to affected areas of scalp, foreheard and temples twice dialy for 4-5 days., Disp: , Rfl: 0 .  nortriptyline (PAMELOR) 10 MG capsule, Take 2 capsules by mouth at bedtime., Disp: , Rfl:   No Known Allergies  I personally reviewed active problem  list, medication list, allergies, family history, social history with the patient/caregiver today.   ROS  Constitutional: Negative for fever or weight change.  Respiratory: Negative for cough and shortness of breath.   Cardiovascular: Negative for chest pain or palpitations.  Gastrointestinal: Negative for abdominal pain, no bowel changes.  Musculoskeletal: Negative for gait problem or joint swelling.  Skin: Negative for rash.  Neurological: positive  for intermittent dizziness but no  headache.  No other specific complaints in a complete review of systems (except as listed in HPI above   Objective  Vitals:   01/22/19 1211  BP: 122/68  Pulse: 97  Resp: 16  Temp: 98 F (36.7 C)  TempSrc: Oral  SpO2: 97%  Weight: 214 lb 12.8 oz (97.4 kg)  Height: 5\' 9"  (1.753 m)    Body mass index is 31.72 kg/m.  Physical Exam  Constitutional: Patient appears well-developed and well-nourished. Obese  No distress.  HEENT: head atraumatic, normocephalic, pupils equal and reactive to light,  neck supple, throat within normal limits Cardiovascular: Normal rate, regular rhythm and normal heart sounds.  No murmur heard. No BLE edema. Pulmonary/Chest: Effort normal and breath sounds normal. No respiratory distress. Abdominal: Soft.  There is no tenderness. Psychiatric: Patient has a normal mood and affect. behavior is normal. Judgment and thought content normal.   Recent Results (from the past 2160 hour(s))  [facility] DIABETES EYE EXAM     Status: None   Collection Time: 11/06/18 12:00 AM  Result Value Ref Range   [facility] Diabetic Eye Exam No Retinopathy No Retinopathy    Comment: [facility] Dr. Burnard Hawthorne II  POCT HgB A1C     Status: Normal   Collection Time: 01/22/19 12:19 PM  Result Value Ref Range   Hemoglobin A1C     HbA1c POC (<> result, manual entry)     HbA1c, POC (prediabetic range)     HbA1c, POC (controlled diabetic range) 6.5 0.0 - 7.0 %      PHQ2/9: Depression screen [facility] 2/9  01/22/2019 10/17/2018  08/13/2018 07/12/2018 04/06/2018  Decreased Interest 0 0 0 0 0  Down, Depressed, Hopeless 0 0 1 0 1  PHQ - 2 Score 0 0 1 0 1  Altered sleeping 0 0 0 0 0  Tired, decreased energy 1 2 1 1 1   Change in appetite 0 0 0 1 0  Feeling bad or failure about yourself  0 0 0 0 0  Trouble concentrating 0 0 0 0 -  Moving slowly or fidgety/restless 0 0 0 0 0  Suicidal thoughts 0 0 0 0 0  PHQ-9 Score 1 2 2 2 2   Difficult doing work/chores Not difficult at all Not difficult at all Not difficult at all Not difficult at all Not difficult at all    GAD 7 : Generalized Anxiety Score 01/22/2019 10/17/2018 08/13/2018 07/12/2018  Nervous, Anxious, on Edge 0 0 1 1  Control/stop worrying 0 3 0 0  Worry too much - different things 0 0 0 0  Trouble relaxing 0 0 0 0  Restless 0 0 0 0  Easily annoyed or irritable 0 0 1 1  Afraid - awful might happen 0 0 0 0  Total GAD 7 Score 0 3 2 2   Anxiety Difficulty Not difficult at all - Not difficult at all Not difficult at all     Fall Risk: Fall Risk  01/22/2019 10/17/2018 08/13/2018 07/12/2018 04/06/2018  Falls in the past year? 0 0 No No No  Number falls in past yr: - - - - -  Injury with Fall? - - - - -  Comment - - - - -     Functional Status Survey: Is the patient deaf or have difficulty hearing?: Yes(hearphones-[place]) Does the patient have difficulty seeing, even when wearing glasses/contacts?: Yes Does the patient have difficulty concentrating, remembering, or making decisions?: No Does the patient have difficulty walking or climbing stairs?: No Does the patient have difficulty dressing or bathing?: No Does the patient have difficulty doing errands alone such as visiting a doctor's office or shopping?: No   Assessment & Plan  1. 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 Dyslipidemia with low high density lipoprotein (HDL) cholesterol with hypertriglyceridemia due to type 2 diabetes mellitus  (HCC)  - COMPLETE METABOLIC PANEL WITH GFR - Lipid panel  3. Bilateral hearing loss, unspecified hearing loss type  Wearing hearphones   4. Gastric reflux  - omeprazole (PRILOSEC) 40 MG capsule; Take 1 capsule (40 mg total) by mouth daily.  Dispense: 90 capsule; Refill: 0  5. Leukocytosis, unspecified type  - CBC with Differential/Platelet  6. Dyslipidemia  - Lipid panel  7. Chronic recurrent major depressive disorder (HCC)  - amphetamine-dextroamphetamine (ADDERALL) 15 MG tablet; Take 1 tablet by mouth 2 (two) times daily.  Dispense: 60 tablet; Refill: 0 - amphetamine-dextroamphetamine (ADDERALL) 15 MG tablet; Take 1 tablet by mouth 2 (two) times daily.  Dispense: 60 tablet; Refill: 0 - amphetamine-dextroamphetamine (ADDERALL) 15 MG tablet; Take 1 tablet by mouth 2 (two) times daily.  Dispense: 60 tablet; Refill: 0

## 2019-02-05 ENCOUNTER — Other Ambulatory Visit: Payer: Self-pay | Admitting: Family Medicine

## 2019-02-05 ENCOUNTER — Encounter: Payer: Self-pay | Admitting: Family Medicine

## 2019-02-05 DIAGNOSIS — F411 Generalized anxiety disorder: Secondary | ICD-10-CM

## 2019-02-05 MED ORDER — ALPRAZOLAM 1 MG PO TABS: 0.5000 mg | ORAL_TABLET | Freq: Every evening | ORAL | 0 refills | Status: DC | PRN

## 2019-02-06 ENCOUNTER — Other Ambulatory Visit: Payer: Self-pay | Admitting: Family Medicine

## 2019-02-06 ENCOUNTER — Encounter: Payer: Self-pay | Admitting: Family Medicine

## 2019-02-06 IMAGING — CT CT RENAL STONE PROTOCOL
2 of 4 series · 16 of 46 positions shown, 18 images · non-contrast
Comparison: None

CLINICAL DATA: Suprapubic abdominal pain, double patient over, no
pain with urination, sensation of needing to urinate but unable,
hematuria, history of diabetes mellitus, GERD, malignant melanoma

EXAM:
CT ABDOMEN AND PELVIS WITHOUT CONTRAST
TECHNIQUE: Multidetector CT imaging of the abdomen and pelvis was performed
following the standard protocol without IV contrast. Sagittal and
coronal MPR images reconstructed from axial data set. Oral contrast
was not administered for this indication

[Series 2: stone full standard · axial · 0.77mm/px · z∈[-1088,-613]mm · 13 of 105 slices shown, 15 images]
[im 5/105  soft-tissue]
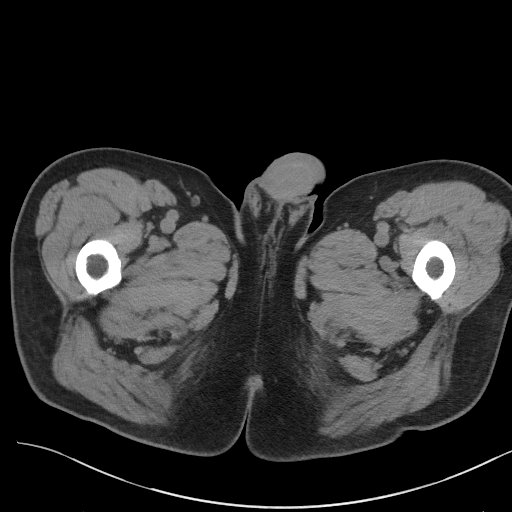
[im 5/105  bone]
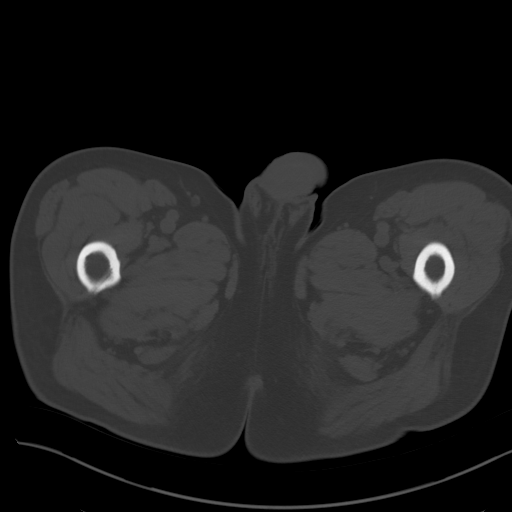
[im 14/105  soft-tissue]
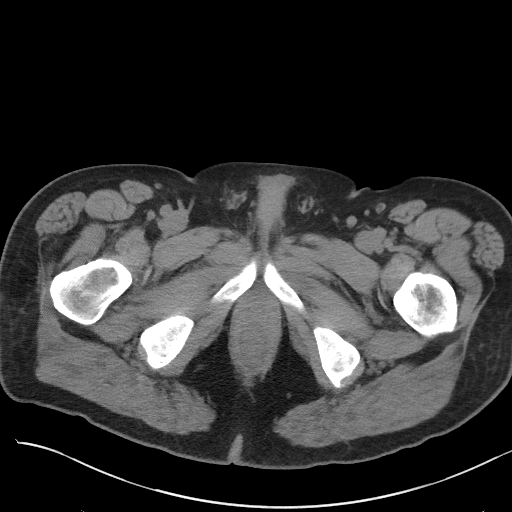
[im 22/105  soft-tissue]
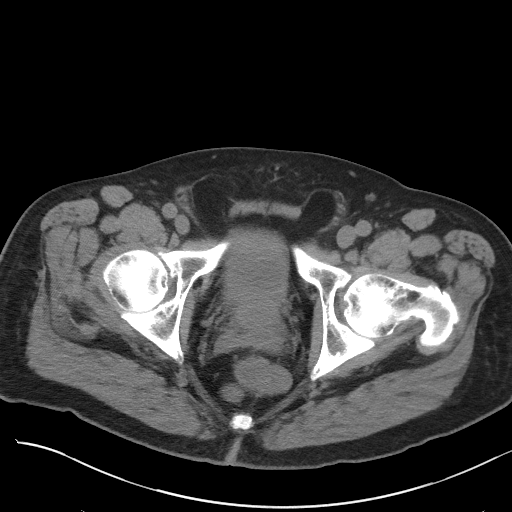
[im 31/105  soft-tissue]
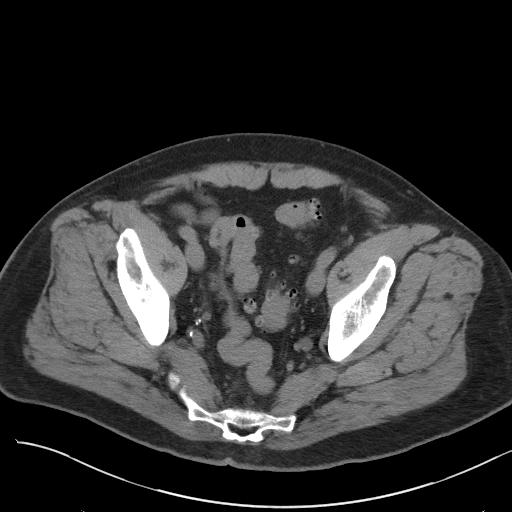
[im 35/105  soft-tissue]
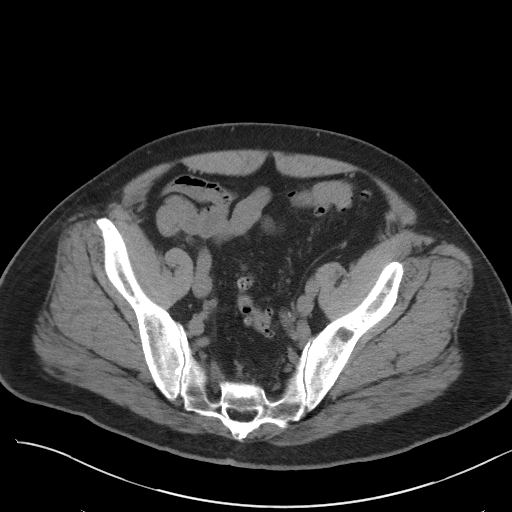
[im 44/105  soft-tissue]
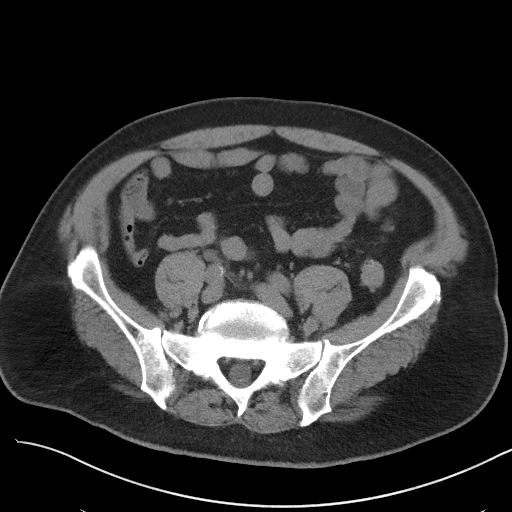
[im 53/105  soft-tissue]
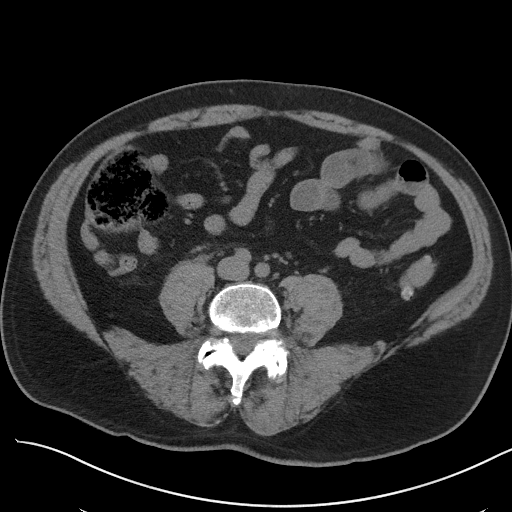
[im 61/105  soft-tissue]
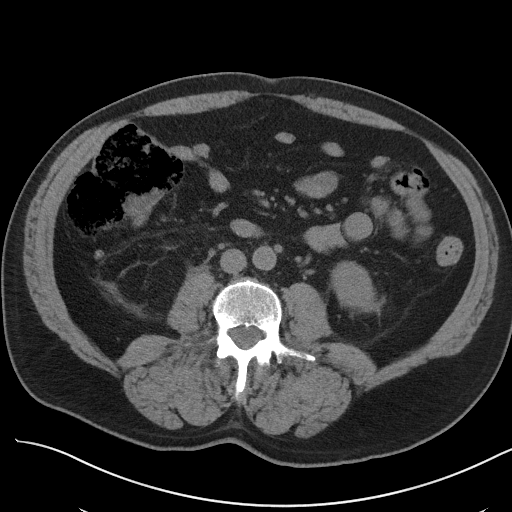
[im 70/105  soft-tissue]
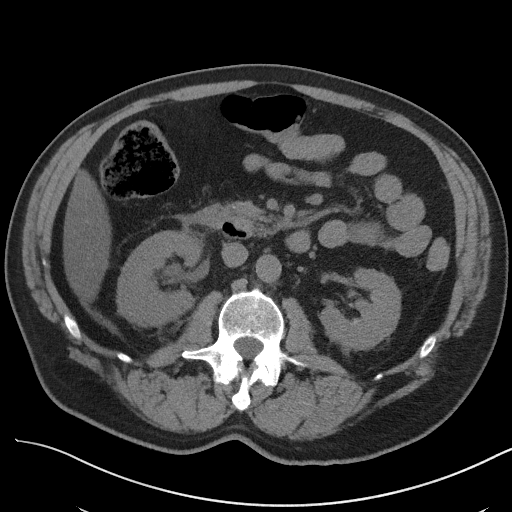
[im 70/105  bone]
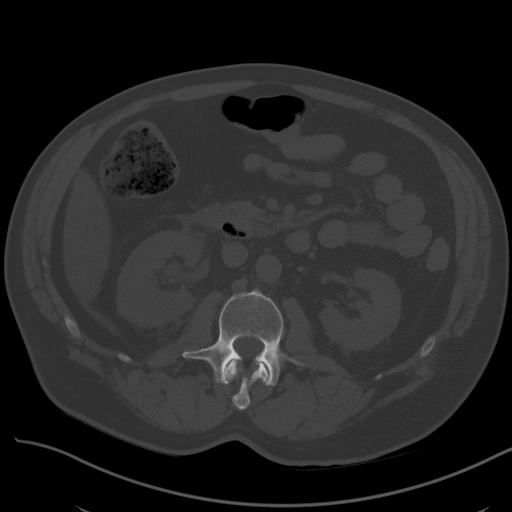
[im 74/105  soft-tissue]
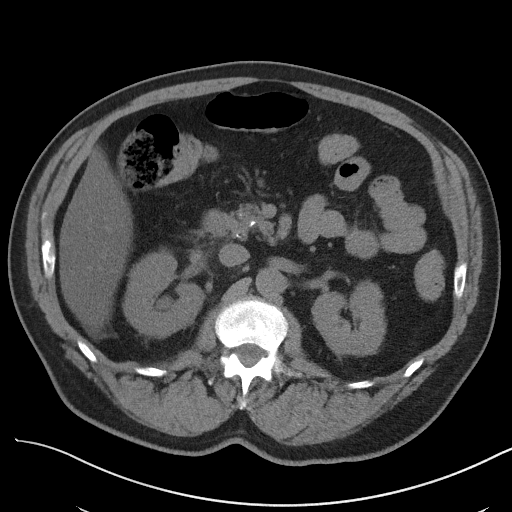
[im 83/105  soft-tissue]
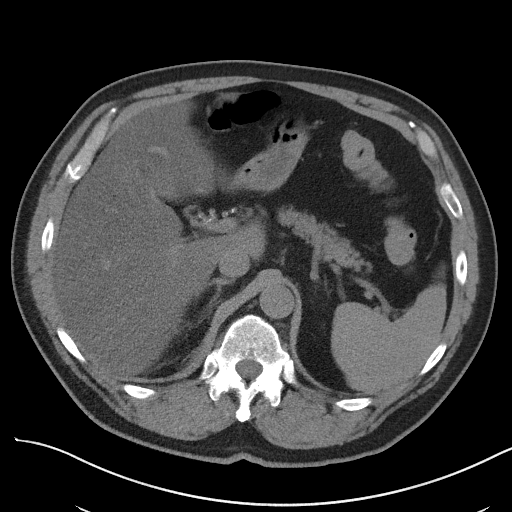
[im 92/105  soft-tissue]
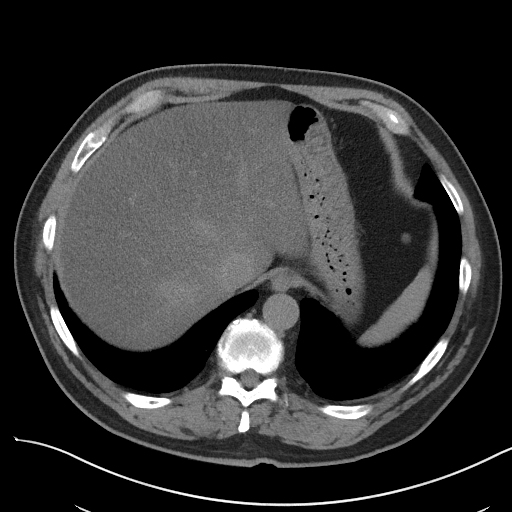
[im 100/105  soft-tissue]
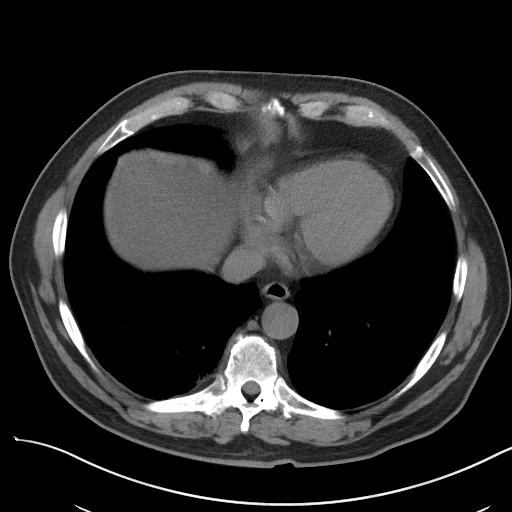

[Series 5: coronal · coronal · 0.78mm/px · 3 of 155 slices shown]
[im 52/155  soft-tissue]
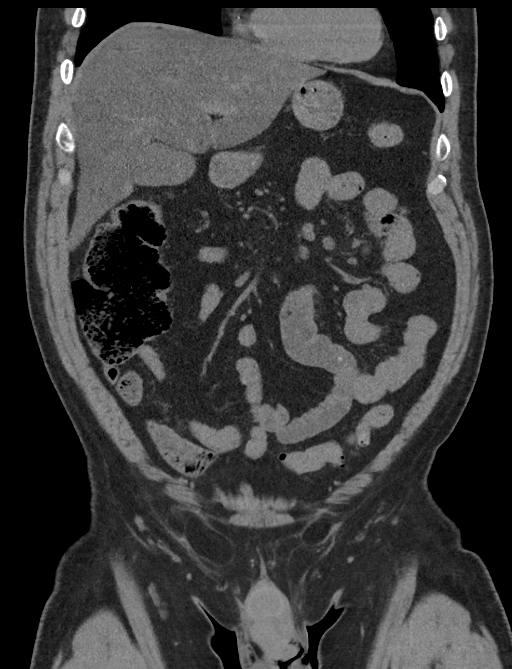
[im 69/155  soft-tissue]
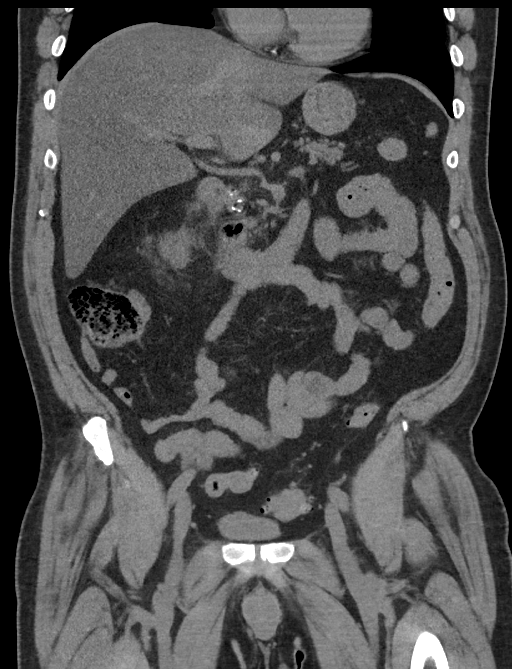
[im 86/155  soft-tissue]
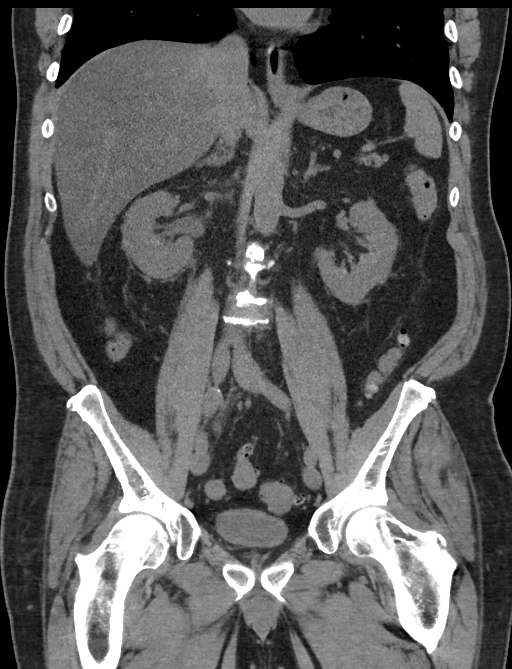

[16 of 46 positions shown; findings below may reference images not displayed]

FINDINGS: Lower chest: Mild RIGHT basilar atelectasis

Hepatobiliary: Diffuse fatty infiltration of liver with focal
sparing adjacent to gallbladder fossa. No focal abnormalities of the
gallbladder liver otherwise identified.

Pancreas: Parenchymal calcifications at head and body of pancreas
consistent with chronic calcific pancreatitis. No mass or ductal
dilatation.

Spleen: Normal appearance. Tiny splenule anterior to spleen
superiorly

Adrenals/Urinary Tract: Adrenal glands normal appearance. LEFT
kidney unremarkable. RIGHT hydronephrosis, hydroureter and
perinephric edema due to a tiny calculus at the RIGHT ureterovesical
junction or within the adjacent bladder lumen. No renal masses. LEFT
ureter and bladder otherwise unremarkable.

Stomach/Bowel: Normal appendix, located perihepatic. Stomach
decompressed. Diverticulosis of descending and sigmoid colon without
evidence of diverticulitis. Bowel loops otherwise unremarkable.

Vascular/Lymphatic: Atherosclerotic calcifications aorta and iliac
arteries. Aorta normal caliber. Coronary arterial calcifications
noted. No adenopathy.

Reproductive: Prostatic enlargement, gland measuring 5.3 x 4.5 cm.

Other: BILATERAL inguinal hernias containing fat. No free air or
free fluid.

Musculoskeletal: No acute osseous findings.
IMPRESSION: RIGHT hydronephrosis and hydroureter with a tiny calculus either at
the RIGHT ureterovesical junction or within the adjacent bladder
lumen.

Chronic calcific pancreatitis.

Distal colonic diverticulosis without evidence of diverticulitis.

Prostatic enlargement.

BILATERAL inguinal hernias containing fat.

Fatty infiltration of liver.

## 2019-02-06 MED ORDER — TAMSULOSIN HCL 0.4 MG PO CAPS: 0.4000 mg | ORAL_CAPSULE | Freq: Every day | ORAL | 2 refills | Status: DC

## 2019-03-12 ENCOUNTER — Encounter: Payer: Self-pay | Admitting: Family Medicine

## 2019-03-12 ENCOUNTER — Other Ambulatory Visit: Payer: Self-pay | Admitting: Family Medicine

## 2019-03-12 MED ORDER — FAMOTIDINE 20 MG PO TABS: 20.0000 mg | ORAL_TABLET | Freq: Every day | ORAL | 0 refills | Status: DC | PRN

## 2019-04-18 ENCOUNTER — Encounter: Payer: Self-pay | Admitting: Family Medicine

## 2019-04-19 ENCOUNTER — Other Ambulatory Visit: Payer: Self-pay

## 2019-04-19 DIAGNOSIS — E1140 Type 2 diabetes mellitus with diabetic neuropathy, unspecified: Secondary | ICD-10-CM

## 2019-04-19 MED ORDER — METFORMIN HCL 500 MG PO TABS: 500.0000 mg | ORAL_TABLET | Freq: Two times a day (BID) | ORAL | 0 refills | Status: DC

## 2019-04-19 NOTE — Telephone Encounter (Signed)
Refill request for diabetic medication:   Metformin 500 mg   Last office visit pertaining to diabetes: 01/22/2019   Lab Results  Component Value Date   HGBA1C 6.5 01/22/2019    Follow-ups on file. 04/22/2019

## 2019-04-22 ENCOUNTER — Encounter: Payer: Self-pay | Admitting: Family Medicine

## 2019-04-22 ENCOUNTER — Ambulatory Visit (INDEPENDENT_AMBULATORY_CARE_PROVIDER_SITE_OTHER): Payer: Medicare Other | Admitting: Family Medicine

## 2019-04-22 VITALS — Ht 69.0 in | Wt 215.0 lb

## 2019-04-22 DIAGNOSIS — H9193 Unspecified hearing loss, bilateral: Secondary | ICD-10-CM

## 2019-04-22 DIAGNOSIS — A6000 Herpesviral infection of urogenital system, unspecified: Secondary | ICD-10-CM | POA: Diagnosis not present

## 2019-04-22 DIAGNOSIS — K219 Gastro-esophageal reflux disease without esophagitis: Secondary | ICD-10-CM | POA: Diagnosis not present

## 2019-04-22 DIAGNOSIS — F411 Generalized anxiety disorder: Secondary | ICD-10-CM

## 2019-04-22 DIAGNOSIS — R351 Nocturia: Secondary | ICD-10-CM

## 2019-04-22 DIAGNOSIS — F339 Major depressive disorder, recurrent, unspecified: Secondary | ICD-10-CM

## 2019-04-22 DIAGNOSIS — E1169 Type 2 diabetes mellitus with other specified complication: Secondary | ICD-10-CM

## 2019-04-22 DIAGNOSIS — E782 Mixed hyperlipidemia: Secondary | ICD-10-CM

## 2019-04-22 DIAGNOSIS — E785 Hyperlipidemia, unspecified: Secondary | ICD-10-CM

## 2019-04-22 DIAGNOSIS — N401 Enlarged prostate with lower urinary tract symptoms: Secondary | ICD-10-CM

## 2019-04-22 MED ORDER — TADALAFIL 5 MG PO TABS: 5.0000 mg | ORAL_TABLET | Freq: Every day | ORAL | 1 refills | Status: DC | PRN

## 2019-04-22 MED ORDER — BUPROPION HCL ER (XL) 300 MG PO TB24: 300.0000 mg | ORAL_TABLET | Freq: Every day | ORAL | 1 refills | Status: DC

## 2019-04-22 MED ORDER — AMPHETAMINE-DEXTROAMPHETAMINE 15 MG PO TABS: 15.0000 mg | ORAL_TABLET | Freq: Two times a day (BID) | ORAL | 0 refills | Status: DC

## 2019-04-22 MED ORDER — OMEPRAZOLE 40 MG PO CPDR: 40.0000 mg | DELAYED_RELEASE_CAPSULE | Freq: Every day | ORAL | 0 refills | Status: DC

## 2019-04-22 MED ORDER — FAMOTIDINE 20 MG PO TABS: 20.0000 mg | ORAL_TABLET | Freq: Every day | ORAL | 0 refills | Status: DC

## 2019-04-22 MED ORDER — ALPRAZOLAM 1 MG PO TABS: 0.5000 mg | ORAL_TABLET | Freq: Every evening | ORAL | 0 refills | Status: DC | PRN

## 2019-04-22 MED ORDER — OMEGA-3-ACID ETHYL ESTERS 1 G PO CAPS: 2.0000 g | ORAL_CAPSULE | Freq: Two times a day (BID) | ORAL | 1 refills | Status: DC

## 2019-04-22 MED ORDER — VALACYCLOVIR HCL 500 MG PO TABS: ORAL_TABLET | ORAL | 1 refills | Status: DC

## 2019-04-22 MED ORDER — TAMSULOSIN HCL 0.4 MG PO CAPS: 0.4000 mg | ORAL_CAPSULE | Freq: Every day | ORAL | 0 refills | Status: DC

## 2019-04-22 NOTE — Progress Notes (Signed)
Name: Shane Boyd.   MRN: [id number]    DOB: November 04, 1951   Date:04/22/2019       Progress Note  Subjective  Chief Complaint  Chief Complaint  Patient presents with  . Medication Refill  . Diabetes  . Depression  . Gastroesophageal Reflux  . Erectile Dysfunction  . GAD  . Dyslipidemia  . Hearing Loss    I connected with  [name]  on 04/22/19 at 11:40 AM EDT by a video enabled telemedicine application and verified that I am speaking with the correct person using two identifiers.  I discussed the limitations of evaluation and management by telemedicine and the availability of in person appointments. The patient expressed understanding and agreed to proceed. Staff also discussed with the patient that there may be a patient responsible charge related to this service. Patient Location: at home Provider Location: [facility] Additional Individuals present: wife is in the house   HPI  GERD: he is still taking Omeprazole in am, and taking two Famotidines at night prn and is doing well at this time, controlling symptoms. Unchanged  Dyslipidemia : he is taking Lovaza BIDand atorvastatin, he states tolerating medication well, no myalgia.Unable to check labs today since it was a telemedicine  DMII with ED: he is taking Metformin twice daily, denies polyphagia, polydipsia or polyuria. He has ED . His hgbA1C has been at goal, twice at 6.5 %, he is eating healthy, he states glucose has been controlled.   BPH: IPSS Questionnaire (AUA-7): Over the past month.   1)  How often have you had a sensation of not emptying your bladder completely after you finish urinating?  0 - Not at all  2)  How often have you had to urinate again less than two hours after you finished urinating? 0 - Not at all  3)  How often have you found you stopped and started again several times when you urinated?  0 - Not at all  4) How difficult have you found it to postpone urination?  1  - Less than 1 time in 5  5) How often have you had a weak urinary stream?  5- almost always   6) How often have you had to push or strain to begin urination?  0 - Not at all  7) How many times did you most typically get up to urinate from the time you went to bed until the time you got up in the morning?  2 - 2 times  Total score:  0-7 mildly symptomatic   8-19 moderately symptomatic   20-35 severely symptomatic   He states improved with Flomax we will add Cialis , discussed rectal exam and PSA on his next visit    Dizziness: he has episodes of dizziness that can happen any time of the day, associated with nausea and vomiting, it can last daily. It is described as the room spinning. He denies tinnitus or associated headaches. Seen by Dr. Melrose Nakayama in the past and was given Pamelor but episodes still present, but no headache. He does not want to see ENT, he is doing home Epley maneuver. No episodes since last visit.  ED: taking generic Viagra because of cost, discussed changing to Cialis because it may improve BPH.   I will send to [facility] to decrease cost  Major Depressive Disorder: he is doing well , major depression in remission, phq9 negative, off citalopram, he denies depression but has been feeling anxious lately.  GAD: discussed importance of going down on BZD dose. He still feels anxious, denies recent panic attack., fidgety,he is now onAlprazolam 1 mg to take prn and #30 lasted 90 days since last visit , he has been feeling a little more anxious because of COVID-19 . Frustrated of the way Trump is not leading the country  Hearing loss: he was advised to have hearing aid, but he decided to by a [employer].Unchanged   Patient Active Problem List   Diagnosis Date Noted  . 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 Right ureteral stone 08/15/2018  . Bilateral inguinal hernia without obstruction or gangrene 08/13/2018  . Fatty liver 08/13/2018  .  Difficulty balancing 05/16/2017  . Special screening for malignant neoplasms, colon   . Benign neoplasm of ascending colon   . Benign neoplasm of transverse colon   . Hearing loss, sensorineural, unilateral 10/21/2016  . Dyslipidemia with low high density lipoprotein (HDL) cholesterol with hypertriglyceridemia due to type 2 diabetes mellitus ([place]) 09/16/2016  . GAD (generalized anxiety disorder) 09/16/2016  . Genital herpes 06/08/2016  . Dizziness 03/09/2016  . History of nonmelanoma skin cancer 09/30/2015  . Neck pain 07/29/2015  . Anxiety 07/19/2015  . Insomnia, persistent 07/19/2015  . CN (constipation) 07/19/2015  . 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 Dyslipidemia 07/19/2015  . Gastric reflux 07/19/2015  . History of shingles 07/19/2015  . History of basal cell cancer 07/19/2015  . H/O Malignant melanoma 07/19/2015  . Personal history of malignant neoplasm of head and neck 07/19/2015  . Chronic recurrent major depressive disorder ([place]) 07/19/2015  . Obesity (BMI 30.0-34.9) 07/19/2015  . ED (erectile dysfunction) 07/19/2015    Past Surgical History:  Procedure Laterality Date  . ARM SKIN LESION BIOPSY / EXCISION Right 05/18/2017  . COLONOSCOPY    . COLONOSCOPY WITH PROPOFOL N/A 12/15/2016   Procedure: COLONOSCOPY WITH PROPOFOL;  Surgeon: Lucilla Lame, MD;  Location: [facility];  Service: Endoscopy;  Laterality: N/A;  diabetic - oral meds  . NASAL SEPTUM SURGERY    . POLYPECTOMY  12/15/2016   Procedure: POLYPECTOMY;  Surgeon: Lucilla Lame, MD;  Location: [place] [facility];  Service: Endoscopy;;  . SENTINEL NODE BIOPSY Right 05/18/2017   [facility]    Family History  Problem Relation Age of Onset  . Diabetes Mother   . Heart disease Mother   . Hyperlipidemia Mother   . CVA Father   . Cancer Father        Colon  . Depression Father     Social History   Socioeconomic History  . Marital status: Married    Spouse name:  Neoma Laming  . Number of children: 1  . Years of education: Not on file  . Highest education level: Master's degree (e.g., MA, MS, MEng, MEd, MSW, MBA)  Occupational History  . Occupation: retired  Scientific laboratory technician  . Financial resource strain: Not hard at all  . Food insecurity:    Worry: Never true    Inability: Never true  . Transportation needs:    Medical: No    Non-medical: No  Tobacco Use  . Smoking status: Never Smoker  . Smokeless tobacco: Never Used  Substance and Sexual Activity  . Alcohol use: Yes    Alcohol/week: 0.0 standard drinks    Comment: Holidays  . Drug use: No  . Sexual activity: Yes    Partners: Female  Lifestyle  . Physical activity:    Days  per week: 7 days    Minutes per session: 40 min  . Stress: Not at all  Relationships  . Social connections:    Talks on phone: More than three times a week    Gets together: Three times a week    Attends religious service: Never    Active member of club or organization: No    Attends meetings of clubs or organizations: Never    Relationship status: Married  . Intimate partner violence:    Fear of current or ex partner: No    Emotionally abused: No    Physically abused: No    Forced sexual activity: No  Other Topics Concern  . Not on file  Social History Narrative   One son: [name] lives in [place]   One son Trip , died from possible suicide while living in [place]     Current Outpatient Medications:  .  ALPRAZolam (XANAX) 1 MG tablet, Take 0.5-1 tablets (0.5-1 mg total) by mouth at bedtime as needed for anxiety., Disp: 30 tablet, Rfl: 0 .  amphetamine-dextroamphetamine (ADDERALL) 15 MG tablet, Take 1 tablet by mouth 2 (two) times daily., Disp: 60 tablet, Rfl: 0 .  amphetamine-dextroamphetamine (ADDERALL) 15 MG tablet, Take 1 tablet by mouth 2 (two) times daily., Disp: 60 tablet, Rfl: 0 .  amphetamine-dextroamphetamine (ADDERALL) 15 MG tablet, Take 1 tablet by mouth 2 (two) times daily., Disp: 60 tablet, Rfl: 0  .  atorvastatin (LIPITOR) 80 MG tablet, Take 1 tablet (80 mg total) by mouth daily., Disp: 90 tablet, Rfl: 1 .  buPROPion (WELLBUTRIN XL) 300 MG 24 hr tablet, Take 1 tablet (300 mg total) by mouth daily., Disp: 90 tablet, Rfl: 1 .  docusate sodium (COLACE) 100 MG capsule, Take 1 capsule (100 mg total) by mouth 2 (two) times daily., Disp: 30 capsule, Rfl: 0 .  famotidine (PEPCID) 20 MG tablet, Take 1 tablet (20 mg total) by mouth daily as needed for heartburn or indigestion., Disp: 180 tablet, Rfl: 0 .  ibuprofen (MOTRIN IB) 200 MG tablet, Take 3 tablets (600 mg total) by mouth every 6 (six) hours as needed., Disp: 20 tablet, Rfl: 0 .  metFORMIN (GLUCOPHAGE) 500 MG tablet, Take 1 tablet (500 mg total) by mouth 2 (two) times daily., Disp: 180 tablet, Rfl: 0 .  omega-3 acid ethyl esters (LOVAZA) 1 g capsule, Take 2 capsules (2 g total) by mouth 2 (two) times daily., Disp: 480 capsule, Rfl: 1 .  omeprazole (PRILOSEC) 40 MG capsule, Take 1 capsule (40 mg total) by mouth daily., Disp: 90 capsule, Rfl: 0 .  sildenafil (REVATIO) 20 MG tablet, Take 20 mg by mouth daily as needed., Disp: , Rfl:  .  tamsulosin (FLOMAX) 0.4 MG CAPS capsule, Take 1 capsule (0.4 mg total) by mouth daily., Disp: 30 capsule, Rfl: 2 .  valACYclovir (VALTREX) 500 MG tablet, TAKE ONE CAPLET BY MOUTH THREE TIMES DAILY, Disp: 90 tablet, Rfl: 1 .  fluoruracil (CARAC) 0.5 % cream, Apply to affected areas of scalp, foreheard and temples twice dialy for 4-5 days., Disp: , Rfl: 0  No Known Allergies  I personally reviewed active problem list, medication list, allergies, family history, social history with the patient/caregiver today.   ROS  Ten systems reviewed and is negative except as mentioned in HPI   Objective  Virtual encounter, vitals not obtained.  Body mass index is 31.75 kg/m.  Physical Exam  Awake, alert and oriented  PHQ2/9: Depression screen [facility] 2/9 04/22/2019 01/22/2019 10/17/2018 08/13/2018 07/12/2018  Decreased  Interest 0 0 0 0 0  Down, Depressed, Hopeless 0 0 0 1 0  PHQ - 2 Score 0 0 0 1 0  Altered sleeping 0 0 0 0 0  Tired, decreased energy 1 1 2 1 1   Change in appetite 0 0 0 0 1  Feeling bad or failure about yourself  0 0 0 0 0  Trouble concentrating 0 0 0 0 0  Moving slowly or fidgety/restless 0 0 0 0 0  Suicidal thoughts 0 0 0 0 0  PHQ-9 Score 1 1 2 2 2   Difficult doing work/chores Not difficult at all Not difficult at all Not difficult at all Not difficult at all Not difficult at all  Some recent data might be hidden   PHQ-2/9 Result is negative.    Fall Risk: Fall Risk  04/22/2019 01/22/2019 10/17/2018 08/13/2018 07/12/2018  Falls in the past year? 0 0 0 No No  Number falls in past yr: 0 - - - -  Injury with Fall? 0 - - - -  Comment - - - - -     Assessment & Plan  1. Chronic recurrent major depressive disorder (HCC)  - amphetamine-dextroamphetamine (ADDERALL) 15 MG tablet; Take 1 tablet by mouth 2 (two) times daily.  Dispense: 60 tablet; Refill: 0 - amphetamine-dextroamphetamine (ADDERALL) 15 MG tablet; Take 1 tablet by mouth 2 (two) times daily.  Dispense: 60 tablet; Refill: 0 - amphetamine-dextroamphetamine (ADDERALL) 15 MG tablet; Take 1 tablet by mouth 2 (two) times daily.  Dispense: 60 tablet; Refill: 0 - buPROPion (WELLBUTRIN XL) 300 MG 24 hr tablet; Take 1 tablet (300 mg total) by mouth daily.  Dispense: 90 tablet; Refill: 1  2. Dyslipidemia with low high density lipoprotein (HDL) cholesterol 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)  - omega-3 acid ethyl esters (LOVAZA) 1 g capsule; Take 2 capsules (2 g total) by mouth 2 (two) times daily.  Dispense: 480 capsule; Refill: 1  3. Gastric reflux  - omeprazole (PRILOSEC) 40 MG capsule; Take 1 capsule (40 mg total) by mouth daily.  Dispense: 90 capsule; Refill: 0 - famotidine (PEPCID) 20 MG tablet; Take 1 tablet (20 mg total) by mouth at bedtime.  Dispense: 90 tablet; Refill: 0  4. Genital herpes simplex,  unspecified site  - valACYclovir (VALTREX) 500 MG tablet; TAKE ONE CAPLET BY MOUTH THREE TIMES DAILY  Dispense: 90 tablet; Refill: 1  5. GAD (generalized anxiety disorder)  - ALPRAZolam (XANAX) 1 MG tablet; Take 0.5-1 tablets (0.5-1 mg total) by mouth at bedtime as needed for anxiety.  Dispense: 30 tablet; Refill: 0  6. BPH associated with nocturia  - tamsulosin (FLOMAX) 0.4 MG CAPS capsule; Take 1 capsule (0.4 mg total) by mouth daily.  Dispense: 90 capsule; Refill: 0 - tadalafil (CIALIS) 5 MG tablet; Take 1 tablet (5 mg total) by mouth daily as needed for erectile dysfunction.  Dispense: 90 tablet; Refill: 1  7. Dyslipidemia   8. Bilateral hearing loss, unspecified hearing loss type   I discussed the assessment and treatment plan with the patient. The patient was provided an opportunity to ask questions and all were answered. The patient agreed with the plan and demonstrated an understanding of the instructions.  The patient was advised to call back or seek an in-person evaluation if the symptoms worsen or if the condition fails to improve as anticipated.  I provided 25 minutes of non-face-to-face time during this encounter.

## 2019-05-01 ENCOUNTER — Encounter: Payer: Self-pay | Admitting: Family Medicine

## 2019-07-17 ENCOUNTER — Other Ambulatory Visit: Payer: Self-pay | Admitting: Family Medicine

## 2019-07-17 DIAGNOSIS — E1140 Type 2 diabetes mellitus with diabetic neuropathy, unspecified: Secondary | ICD-10-CM

## 2019-07-17 DIAGNOSIS — N521 Erectile dysfunction due to diseases classified elsewhere: Secondary | ICD-10-CM

## 2019-07-24 ENCOUNTER — Encounter: Payer: Self-pay | Admitting: Family Medicine

## 2019-07-24 ENCOUNTER — Other Ambulatory Visit: Payer: Self-pay

## 2019-07-24 ENCOUNTER — Ambulatory Visit: Payer: Medicare Other | Admitting: Family Medicine

## 2019-07-24 VITALS — BP 132/76 | HR 105 | Temp 97.3°F | Resp 16 | Ht 69.0 in | Wt 212.5 lb

## 2019-07-24 DIAGNOSIS — F339 Major depressive disorder, recurrent, unspecified: Secondary | ICD-10-CM

## 2019-07-24 DIAGNOSIS — D72829 Elevated white blood cell count, unspecified: Secondary | ICD-10-CM

## 2019-07-24 DIAGNOSIS — K219 Gastro-esophageal reflux disease without esophagitis: Secondary | ICD-10-CM | POA: Diagnosis not present

## 2019-07-24 DIAGNOSIS — E1140 Type 2 diabetes mellitus with diabetic neuropathy, unspecified: Secondary | ICD-10-CM | POA: Diagnosis not present

## 2019-07-24 DIAGNOSIS — N521 Erectile dysfunction due to diseases classified elsewhere: Secondary | ICD-10-CM

## 2019-07-24 DIAGNOSIS — N401 Enlarged prostate with lower urinary tract symptoms: Secondary | ICD-10-CM

## 2019-07-24 DIAGNOSIS — R351 Nocturia: Secondary | ICD-10-CM

## 2019-07-24 DIAGNOSIS — F411 Generalized anxiety disorder: Secondary | ICD-10-CM

## 2019-07-24 DIAGNOSIS — E785 Hyperlipidemia, unspecified: Secondary | ICD-10-CM

## 2019-07-24 LAB — POCT GLYCOSYLATED HEMOGLOBIN (HGB A1C): HbA1c, POC (controlled diabetic range): 8.6 % — AB (ref 0.0–7.0)

## 2019-07-24 MED ORDER — AMPHETAMINE-DEXTROAMPHETAMINE 15 MG PO TABS: 15.0000 mg | ORAL_TABLET | Freq: Two times a day (BID) | ORAL | 0 refills | Status: DC

## 2019-07-24 MED ORDER — ATORVASTATIN CALCIUM 80 MG PO TABS: 80.0000 mg | ORAL_TABLET | Freq: Every day | ORAL | 1 refills | Status: DC

## 2019-07-24 MED ORDER — ALPRAZOLAM 1 MG PO TABS: 0.5000 mg | ORAL_TABLET | Freq: Every evening | ORAL | 0 refills | Status: DC | PRN

## 2019-07-24 MED ORDER — TAMSULOSIN HCL 0.4 MG PO CAPS: 0.4000 mg | ORAL_CAPSULE | Freq: Every day | ORAL | 1 refills | Status: DC

## 2019-07-24 MED ORDER — METFORMIN HCL ER 750 MG PO TB24: 1500.0000 mg | ORAL_TABLET | Freq: Every day | ORAL | 1 refills | Status: DC

## 2019-07-24 MED ORDER — OMEPRAZOLE 40 MG PO CPDR: 40.0000 mg | DELAYED_RELEASE_CAPSULE | Freq: Every day | ORAL | 1 refills | Status: DC

## 2019-07-24 MED ORDER — BUPROPION HCL ER (XL) 300 MG PO TB24: 300.0000 mg | ORAL_TABLET | Freq: Every day | ORAL | 1 refills | Status: DC

## 2019-07-24 MED ORDER — FAMOTIDINE 20 MG PO TABS: 20.0000 mg | ORAL_TABLET | Freq: Every day | ORAL | 1 refills | Status: DC

## 2019-07-24 MED ORDER — TADALAFIL 5 MG PO TABS: 5.0000 mg | ORAL_TABLET | Freq: Every day | ORAL | 1 refills | Status: DC | PRN

## 2019-07-24 NOTE — Progress Notes (Signed)
Name: Shane Boyd.   MRN: [id number]    DOB: Apr 04, 1951   Date:07/24/2019       Progress Note  Subjective  Chief Complaint  Chief Complaint  Patient presents with  . Medication Refill    3 month F/U  . Diabetes  . Depression  . Gastroesophageal Reflux  . Erectile Dysfunction  . GAD  . Hearing Loss  . Dyslipidemia    HPI  GERD: he is still taking Omeprazole in am, and taking two Famotidines at night prn and is doing well at thsi time, controlling symptoms. He needs refills   Dyslipidemia : he is taking Lovaza BIDand atorvastatin, he states tolerating medication well, no myalgia.  DMII with ED: he is taking Metformin twice daily, denies polyphagia, polydipsia or polyuria. He has ED . His hgbA1C was at goal for a long time, last A1C was 6.5% six months ago but today it was up to 8.6% , he is not as active because COVID-19, but his diet has not changed much, not checking his glucose lately.   Dizziness: he has episodes of dizziness that can happen any time of the day, associated with nausea and vomiting, it can last daily. It is described as the room spinning. He denies tinnitus or associated headaches. Seen by Dr. Melrose Nakayama in the past and was given Pamelor but episodes still present, but no headache. He has been symptoms free for over 3 month snow   ED: taking generic Viagra and is doing well, denies side effects of medication, stable.   Major Depressive Disorder: he is still taking Citalopram, getting a little more stressed lately, feeling a little down lately, and more anxious. Continue Citalopram and try to exercise more.   GAD: discussed importance of going down on BZD dose. He still feels anxious, denies recent panic attack., fidgety,he is now onAlprazolam 1 mg to take prn and #45 pills to last 3 monthssince 01/20019 and now he is only taking it prn, and was down to 30 pills for three months but  he ran out last week, so we will increase to 35 pills and monitor, he is  more stressed with COVID-19 , upcoming elections.  Hearing loss: he was advised to have hearing aid, but he decided to by a [place] hear phones.Stable  Patient Active Problem List   Diagnosis Date Noted  . 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 Right ureteral stone 08/15/2018  . Bilateral inguinal hernia without obstruction or gangrene 08/13/2018  . Fatty liver 08/13/2018  . Difficulty balancing 05/16/2017  . Special screening for malignant neoplasms, colon   . Benign neoplasm of ascending colon   . Benign neoplasm of transverse colon   . Hearing loss, sensorineural, unilateral 10/21/2016  . Dyslipidemia with low high density lipoprotein (HDL) cholesterol with hypertriglyceridemia due to type 2 diabetes mellitus ([place]) 09/16/2016  . GAD (generalized anxiety disorder) 09/16/2016  . Genital herpes 06/08/2016  . Dizziness 03/09/2016  . History of nonmelanoma skin cancer 09/30/2015  . Neck pain 07/29/2015  . Anxiety 07/19/2015  . Insomnia, persistent 07/19/2015  . CN (constipation) 07/19/2015  . 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 Dyslipidemia 07/19/2015  . Gastric reflux 07/19/2015  . History of shingles 07/19/2015  . History of basal cell cancer 07/19/2015  . H/O Malignant melanoma 07/19/2015  . Personal history of malignant neoplasm of head and neck 07/19/2015  . Chronic recurrent major depressive disorder ([place]) 07/19/2015  . Obesity (BMI  30.0-34.9) 07/19/2015  . ED (erectile dysfunction) 07/19/2015    Past Surgical History:  Procedure Laterality Date  . ARM SKIN LESION BIOPSY / EXCISION Right 05/18/2017  . COLONOSCOPY    . COLONOSCOPY WITH PROPOFOL N/A 12/15/2016   Procedure: COLONOSCOPY WITH PROPOFOL;  Surgeon: Lucilla Lame, MD;  Location: [facility];  Service: Endoscopy;  Laterality: N/A;  diabetic - oral meds  . NASAL SEPTUM SURGERY    . POLYPECTOMY  12/15/2016   Procedure: POLYPECTOMY;   Surgeon: Lucilla Lame, MD;  Location: [facility];  Service: Endoscopy;;  . SENTINEL NODE BIOPSY Right 05/18/2017   [facility]    Family History  Problem Relation Age of Onset  . Diabetes Mother   . Heart disease Mother   . Hyperlipidemia Mother   . CVA Father   . Cancer Father        Colon  . Depression Father     Social History   Socioeconomic History  . Marital status: Married    Spouse name: Neoma Laming  . Number of children: 1  . Years of education: Not on file  . Highest education level: Master's degree (e.g., MA, MS, MEng, MEd, MSW, MBA)  Occupational History  . Occupation: retired  Scientific laboratory technician  . Financial resource strain: Not hard at all  . Food insecurity    Worry: Never true    Inability: Never true  . Transportation needs    Medical: No    Non-medical: No  Tobacco Use  . Smoking status: Never Smoker  . Smokeless tobacco: Never Used  Substance and Sexual Activity  . Alcohol use: Yes    Alcohol/week: 0.0 standard drinks    Comment: Holidays  . Drug use: No  . Sexual activity: Yes    Partners: Female  Lifestyle  . Physical activity    Days per week: 7 days    Minutes per session: 40 min  . Stress: Not at all  Relationships  . Social connections    Talks on phone: More than three times a week    Gets together: Three times a week    Attends religious service: Never    Active member of club or organization: No    Attends meetings of clubs or organizations: Never    Relationship status: Married  . Intimate partner violence    Fear of current or ex partner: No    Emotionally abused: No    Physically abused: No    Forced sexual activity: No  Other Topics Concern  . Not on file  Social History Narrative   One son: [name] lives in [place]   One son Trip , died from possible suicide while living in [place]     Current Outpatient Medications:  .  ALPRAZolam (XANAX) 1 MG tablet, Take 0.5-1 tablets (0.5-1 mg total) by mouth at bedtime as needed for  anxiety., Disp: 30 tablet, Rfl: 0 .  amphetamine-dextroamphetamine (ADDERALL) 15 MG tablet, Take 1 tablet by mouth 2 (two) times daily., Disp: 60 tablet, Rfl: 0 .  amphetamine-dextroamphetamine (ADDERALL) 15 MG tablet, Take 1 tablet by mouth 2 (two) times daily., Disp: 60 tablet, Rfl: 0 .  amphetamine-dextroamphetamine (ADDERALL) 15 MG tablet, Take 1 tablet by mouth 2 (two) times daily., Disp: 60 tablet, Rfl: 0 .  atorvastatin (LIPITOR) 80 MG tablet, Take 1 tablet (80 mg total) by mouth daily., Disp: 90 tablet, Rfl: 1 .  buPROPion (WELLBUTRIN XL) 300 MG 24 hr tablet, Take 1 tablet (300 mg total) by mouth daily.,  Disp: 90 tablet, Rfl: 1 .  docusate sodium (COLACE) 100 MG capsule, Take 1 capsule (100 mg total) by mouth 2 (two) times daily., Disp: 30 capsule, Rfl: 0 .  famotidine (PEPCID) 20 MG tablet, Take 1 tablet (20 mg total) by mouth at bedtime., Disp: 90 tablet, Rfl: 0 .  fluoruracil (CARAC) 0.5 % cream, Apply to affected areas of scalp, foreheard and temples twice dialy for 4-5 days., Disp: , Rfl: 0 .  ibuprofen (MOTRIN IB) 200 MG tablet, Take 3 tablets (600 mg total) by mouth every 6 (six) hours as needed., Disp: 20 tablet, Rfl: 0 .  omega-3 acid ethyl esters (LOVAZA) 1 g capsule, Take 2 capsules (2 g total) by mouth 2 (two) times daily., Disp: 480 capsule, Rfl: 1 .  omeprazole (PRILOSEC) 40 MG capsule, Take 1 capsule (40 mg total) by mouth daily., Disp: 90 capsule, Rfl: 0 .  tadalafil (CIALIS) 5 MG tablet, Take 1 tablet (5 mg total) by mouth daily as needed for erectile dysfunction., Disp: 90 tablet, Rfl: 1 .  tamsulosin (FLOMAX) 0.4 MG CAPS capsule, Take 1 capsule (0.4 mg total) by mouth daily., Disp: 90 capsule, Rfl: 0 .  valACYclovir (VALTREX) 500 MG tablet, TAKE ONE CAPLET BY MOUTH THREE TIMES DAILY, Disp: 90 tablet, Rfl: 1  No Known Allergies  I personally reviewed active problem list, medication list, allergies, family history, social history with the patient/caregiver  today.   ROS  Constitutional: Negative for fever or weight change.  Respiratory: Negative for cough and shortness of breath.   Cardiovascular: Negative for chest pain or palpitations.  Gastrointestinal: Negative for abdominal pain, no bowel changes.  Musculoskeletal: Negative for gait problem or joint swelling.  Skin: Negative for rash.  Neurological: Negative for dizziness or headache.  No other specific complaints in a complete review of systems (except as listed in HPI above).  Objective  Vitals:   07/24/19 1145  BP: 132/76  Pulse: (!) 105  Resp: 16  Temp: (!) 97.3 F (36.3 C)  TempSrc: Temporal  SpO2: 99%  Weight: 212 lb 8 oz (96.4 kg)  Height: 5\' 9"  (1.753 m)    Body mass index is 31.38 kg/m.  Physical Exam  Constitutional: Patient appears well-developed and well-nourished. Obese No distress.  HEENT: head atraumatic, normocephalic, pupils equal and reactive to light,neck supple Cardiovascular: Normal rate, regular rhythm and normal heart sounds.  No murmur heard. No BLE edema. Pulmonary/Chest: Effort normal and breath sounds normal. No respiratory distress. Abdominal: Soft.  There is no tenderness. Psychiatric: Patient has a normal mood and affect. behavior is normal. Judgment and thought content normal.  Recent Results (from the past 2160 hour(s))  POCT HgB A1C     Status: Abnormal   Collection Time: 07/24/19 12:01 PM  Result Value Ref Range   Hemoglobin A1C     HbA1c POC (<> result, manual entry)     HbA1c, POC (prediabetic range)     HbA1c, POC (controlled diabetic range) 8.6 (A) 0.0 - 7.0 %     PHQ2/9: Depression screen [facility] 2/9 07/24/2019 04/22/2019 01/22/2019 10/17/2018 08/13/2018  Decreased Interest 0 0 0 0 0  Down, Depressed, Hopeless 1 0 0 0 1  PHQ - 2 Score 1 0 0 0 1  Altered sleeping 0 0 0 0 0  Tired, decreased energy 1 1 1 2 1   Change in appetite 0 0 0 0 0  Feeling bad or failure about yourself  0 0 0 0 0  Trouble concentrating 0 0 0  0 0   Moving slowly or fidgety/restless 0 0 0 0 0  Suicidal thoughts 0 0 0 0 0  PHQ-9 Score 2 1 1 2 2   Difficult doing work/chores Not difficult at all Not difficult at all Not difficult at all Not difficult at all Not difficult at all  Some recent data might be hidden    phq 9 is positive   Fall Risk: Fall Risk  07/24/2019 04/22/2019 01/22/2019 10/17/2018 08/13/2018  Falls in the past year? 0 0 0 0 No  Number falls in past yr: 0 0 - - -  Injury with Fall? 0 0 - - -  Comment - - - - -    Functional Status Survey: Is the patient deaf or have difficulty hearing?: Yes Does the patient have difficulty seeing, even when wearing glasses/contacts?: Yes Does the patient have difficulty concentrating, remembering, or making decisions?: No Does the patient have difficulty walking or climbing stairs?: No Does the patient have difficulty dressing or bathing?: No Does the patient have difficulty doing errands alone such as visiting a doctor's office or shopping?: No    Assessment & Plan   1. 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 ratio - POCT HgB A1C - metFORMIN (GLUCOPHAGE XR) 750 MG 24 hr tablet; Take 2 tablets (1,500 mg total) by mouth daily with breakfast.  Dispense: 180 tablet; Refill: 1 - tadalafil (CIALIS) 5 MG tablet; Take 1 tablet (5 mg total) by mouth daily as needed for erectile dysfunction.  Dispense: 90 tablet; Refill: 1  2. Chronic recurrent major depressive disorder (HCC)  - amphetamine-dextroamphetamine (ADDERALL) 15 MG tablet; Take 1 tablet by mouth 2 (two) times daily.  Dispense: 60 tablet; Refill: 0 - amphetamine-dextroamphetamine (ADDERALL) 15 MG tablet; Take 1 tablet by mouth 2 (two) times daily.  Dispense: 60 tablet; Refill: 0 - amphetamine-dextroamphetamine (ADDERALL) 15 MG tablet; Take 1 tablet by mouth 2 (two) times daily.  Dispense: 60 tablet; Refill: 0 - buPROPion (WELLBUTRIN XL) 300 MG 24 hr tablet; Take 1  tablet (300 mg total) by mouth daily.  Dispense: 90 tablet; Refill: 1  3. Gastric reflux  - omeprazole (PRILOSEC) 40 MG capsule; Take 1 capsule (40 mg total) by mouth daily.  Dispense: 90 capsule; Refill: 1 - famotidine (PEPCID) 20 MG tablet; Take 1 tablet (20 mg total) by mouth at bedtime.  Dispense: 90 tablet; Refill: 1  4. BPH associated with nocturia  - tamsulosin (FLOMAX) 0.4 MG CAPS capsule; Take 1 capsule (0.4 mg total) by mouth daily.  Dispense: 90 capsule; Refill: 1 - tadalafil (CIALIS) 5 MG tablet; Take 1 tablet (5 mg total) by mouth daily as needed for erectile dysfunction.  Dispense: 90 tablet; Refill: 1  5. Dyslipidemia  - atorvastatin (LIPITOR) 80 MG tablet; Take 1 tablet (80 mg total) by mouth daily.  Dispense: 90 tablet; Refill: 1  6. GAD (generalized anxiety disorder)  - ALPRAZolam (XANAX) 1 MG tablet; Take 0.5-1 tablets (0.5-1 mg total) by mouth at bedtime as needed for anxiety.  Dispense: 35 tablet; Refill: 0

## 2019-07-25 LAB — MICROALBUMIN / CREATININE URINE RATIO
Creatinine, Urine: 92 mg/dL (ref 20–320)
Microalb Creat Ratio: 2 mcg/mg creat (ref <30)
Microalb, Ur: 0.2 mg/dL

## 2019-09-05 ENCOUNTER — Other Ambulatory Visit: Payer: Self-pay

## 2019-09-05 ENCOUNTER — Ambulatory Visit (INDEPENDENT_AMBULATORY_CARE_PROVIDER_SITE_OTHER): Payer: Medicare Other

## 2019-09-05 DIAGNOSIS — Z23 Encounter for immunization: Secondary | ICD-10-CM

## 2019-09-10 ENCOUNTER — Other Ambulatory Visit: Payer: Self-pay | Admitting: Family Medicine

## 2019-09-10 DIAGNOSIS — D649 Anemia, unspecified: Secondary | ICD-10-CM

## 2019-09-11 LAB — CBC WITH DIFFERENTIAL/PLATELET
Absolute Monocytes: 436 cells/uL (ref 200–950)
Basophils Absolute: 52 cells/uL (ref 0–200)
Basophils Relative: 0.8 %
Eosinophils Absolute: 241 cells/uL (ref 15–500)
Eosinophils Relative: 3.7 %
HCT: 35 % — ABNORMAL LOW (ref 38.5–50.0)
Hemoglobin: 11.5 g/dL — ABNORMAL LOW (ref 13.2–17.1)
Lymphs Abs: 2464 cells/uL (ref 850–3900)
MCH: 25.3 pg — ABNORMAL LOW (ref 27.0–33.0)
MCHC: 32.9 g/dL (ref 32.0–36.0)
MCV: 77.1 fL — ABNORMAL LOW (ref 80.0–100.0)
MPV: 11.2 fL (ref 7.5–12.5)
Monocytes Relative: 6.7 %
Neutro Abs: 3309 cells/uL (ref 1500–7800)
Neutrophils Relative %: 50.9 %
Platelets: 230 10*3/uL (ref 140–400)
RBC: 4.54 10*6/uL (ref 4.20–5.80)
RDW: 16.4 % — ABNORMAL HIGH (ref 11.0–15.0)
Total Lymphocyte: 37.9 %
WBC: 6.5 10*3/uL (ref 3.8–10.8)

## 2019-09-11 LAB — COMPLETE METABOLIC PANEL WITH GFR
AG Ratio: 2.3 (calc) (ref 1.0–2.5)
ALT: 13 U/L (ref 9–46)
AST: 15 U/L (ref 10–35)
Albumin: 4.5 g/dL (ref 3.6–5.1)
Alkaline phosphatase (APISO): 76 U/L (ref 35–144)
BUN: 15 mg/dL (ref 7–25)
CO2: 27 mmol/L (ref 20–32)
Calcium: 9.5 mg/dL (ref 8.6–10.3)
Chloride: 104 mmol/L (ref 98–110)
Creat: 1.14 mg/dL (ref 0.70–1.18)
GFR, Est African American: 75 mL/min/{1.73_m2} (ref >=60)
GFR, Est Non African American: 65 mL/min/{1.73_m2} (ref >=60)
Globulin: 2 g/dL (calc) (ref 1.9–3.7)
Glucose, Bld: 113 mg/dL — ABNORMAL HIGH (ref 65–99)
Potassium: 4.7 mmol/L (ref 3.5–5.3)
Sodium: 139 mmol/L (ref 135–146)
Total Bilirubin: 0.3 mg/dL (ref 0.2–1.2)
Total Protein: 6.5 g/dL (ref 6.1–8.1)

## 2019-09-11 LAB — LIPID PANEL
Cholesterol: 116 mg/dL (ref <200)
HDL: 45 mg/dL (ref >=40)
LDL Cholesterol (Calc): 47 mg/dL (calc)
Non-HDL Cholesterol (Calc): 71 mg/dL (calc) (ref <130)
Total CHOL/HDL Ratio: 2.6 (calc) (ref <5.0)
Triglycerides: 160 mg/dL — ABNORMAL HIGH (ref <150)

## 2019-09-11 LAB — IRON,TIBC AND FERRITIN PANEL
%SAT: 14 % (calc) — ABNORMAL LOW (ref 20–48)
Ferritin: 6 ng/mL — ABNORMAL LOW (ref 24–380)
Iron: 53 ug/dL (ref 50–180)
TIBC: 371 mcg/dL (calc) (ref 250–425)

## 2019-09-11 LAB — TEST AUTHORIZATION

## 2019-09-11 LAB — FRUCTOSAMINE: Fructosamine: 225 umol/L (ref 205–285)

## 2019-09-12 ENCOUNTER — Other Ambulatory Visit: Payer: Self-pay | Admitting: Family Medicine

## 2019-09-12 DIAGNOSIS — D508 Other iron deficiency anemias: Secondary | ICD-10-CM

## 2019-10-17 ENCOUNTER — Other Ambulatory Visit: Payer: Self-pay | Admitting: Family Medicine

## 2019-10-17 ENCOUNTER — Ambulatory Visit: Payer: Medicare Other | Admitting: Gastroenterology

## 2019-10-17 ENCOUNTER — Other Ambulatory Visit: Payer: Self-pay

## 2019-10-17 ENCOUNTER — Encounter: Payer: Self-pay | Admitting: Gastroenterology

## 2019-10-17 VITALS — BP 128/75 | HR 101 | Temp 98.5°F | Ht 69.0 in | Wt 209.8 lb

## 2019-10-17 DIAGNOSIS — E611 Iron deficiency: Secondary | ICD-10-CM

## 2019-10-17 NOTE — Progress Notes (Signed)
Primary Care Physician: Steele Sizer, MD  Primary Gastroenterologist:  Dr. Lucilla Lame  Chief Complaint  Patient presents with  . iron deficiency anemia    HPI: Shane Boyd. is a 68 y.o. male here for history of iron deficiency anemia.  The patient had a colonoscopy by me 2 years ago and was found to have diverticulosis and 2 adenomas.  The patient's most recent lab work showed iron deficiency with the following results:  Component     Latest Ref Rng & Units 04/25/2018 09/05/2019  Iron     50 - 180 mcg/dL 50 53  TIBC     250 - 425 mcg/dL (calc) 355 371  %SAT     20 - 48 % (calc) 14 (L) 14 (L)  Ferritin     24 - 380 ng/mL 14 (L) 6 (L)   The patient's CBC showed:  Component     Latest Ref Rng & Units 04/25/2018 08/12/2018 09/05/2019  Hemoglobin     13.2 - 17.1 g/dL 12.4 (L) 13.0 11.5 (L)  HCT     38.5 - 50.0 % 36.7 (L) 38.5 (L) 35.0 (L)  MCV     80.0 - 100.0 fL 79.3 (L) 79.9 (L) 77.1 (L)   The patient reports that he has been noticing for the last few days that he gets tired very quickly when doing chores around the house that used to be very easy for him.  He denies any black stools or bloody stools or any sign of any GI bleeding.  Current Outpatient Medications  Medication Sig Dispense Refill  . ALPRAZolam (XANAX) 1 MG tablet Take 0.5-1 tablets (0.5-1 mg total) by mouth at bedtime as needed for anxiety. 35 tablet 0  . amphetamine-dextroamphetamine (ADDERALL) 15 MG tablet Take 1 tablet by mouth 2 (two) times daily. 60 tablet 0  . amphetamine-dextroamphetamine (ADDERALL) 15 MG tablet Take 1 tablet by mouth 2 (two) times daily. 60 tablet 0  . amphetamine-dextroamphetamine (ADDERALL) 15 MG tablet Take 1 tablet by mouth 2 (two) times daily. 60 tablet 0  . atorvastatin (LIPITOR) 80 MG tablet Take 1 tablet (80 mg total) by mouth daily. 90 tablet 1  . buPROPion (WELLBUTRIN XL) 300 MG 24 hr tablet Take 1 tablet (300 mg total) by mouth daily. 90 tablet 1  . docusate sodium  (COLACE) 100 MG capsule Take 1 capsule (100 mg total) by mouth 2 (two) times daily. 30 capsule 0  . famotidine (PEPCID) 20 MG tablet Take 1 tablet (20 mg total) by mouth at bedtime. 90 tablet 1  . fluoruracil (CARAC) 0.5 % cream Apply to affected areas of scalp, foreheard and temples twice dialy for 4-5 days.  0  . ibuprofen (MOTRIN IB) 200 MG tablet Take 3 tablets (600 mg total) by mouth every 6 (six) hours as needed. 20 tablet 0  . metFORMIN (GLUCOPHAGE XR) 750 MG 24 hr tablet Take 2 tablets (1,500 mg total) by mouth daily with breakfast. 180 tablet 1  . omega-3 acid ethyl esters (LOVAZA) 1 g capsule Take 2 capsules (2 g total) by mouth 2 (two) times daily. 480 capsule 1  . omeprazole (PRILOSEC) 40 MG capsule Take 1 capsule (40 mg total) by mouth daily. 90 capsule 1  . tadalafil (CIALIS) 5 MG tablet Take 1 tablet (5 mg total) by mouth daily as needed for erectile dysfunction. 90 tablet 1  . tamsulosin (FLOMAX) 0.4 MG CAPS capsule Take 1 capsule (0.4 mg total) by mouth daily. 90 capsule  1  . valACYclovir (VALTREX) 500 MG tablet TAKE ONE CAPLET BY MOUTH THREE TIMES DAILY 90 tablet 1   No current facility-administered medications for this visit.     Allergies as of 10/17/2019  . (No Known Allergies)    ROS:  General: Negative for anorexia, weight loss, fever, chills, fatigue, weakness. ENT: Negative for hoarseness, difficulty swallowing , nasal congestion. CV: Negative for chest pain, angina, palpitations, dyspnea on exertion, peripheral edema.  Respiratory: Negative for dyspnea at rest, dyspnea on exertion, cough, sputum, wheezing.  GI: See history of present illness. GU:  Negative for dysuria, hematuria, urinary incontinence, urinary frequency, nocturnal urination.  Endo: Negative for unusual weight change.    Physical Examination:   BP 128/75   Pulse (!) 101   Temp 98.5 F (36.9 C) (Oral)   Ht 5\' 9"  (1.753 m)   Wt 209 lb 12.8 oz (95.2 kg)   BMI 30.98 kg/m   General:  Well-nourished, well-developed in no acute distress.  Eyes: No icterus. Conjunctivae pink. Lungs: Clear to auscultation bilaterally. Non-labored. Heart: Regular rate and rhythm, no murmurs rubs or gallops.  Abdomen: Bowel sounds are normal, nontender, nondistended, no hepatosplenomegaly or masses, no abdominal bruits or hernia , no rebound or guarding.   Extremities: No lower extremity edema. No clubbing or deformities. Neuro: Alert and oriented x 3.  Grossly intact. Skin: Warm and dry, no jaundice.   Psych: Alert and cooperative, normal mood and affect.  Labs:    Imaging Studies: No results found.  Assessment and Plan:   [name]. is a 68 y.o. y/o male who comes in today with a anemia that had a hemoglobin of 12.4 that went up to 13 and is now down at 11.5.  The patient has been found to have low iron studies.  Due to his fatigue with exertion I recommend that he be evaluated by cardiology prior to undergoing any GI work-up.  Although he has anemia his 11.5 hemoglobin may not explain all of his fatigue with exertion.  The patient has been told that after he has been seen by cardiology he should commence with a GI work-up including an upper endoscopy and colonoscopy and if negative the patient then should undergo a capsule study.  The patient has been explained the plan and agrees with it.     Lucilla Lame, MD. Marval Regal    Note: This dictation was prepared with Dragon dictation along with smaller phrase technology. Any transcriptional errors that result from this process are unintentional.

## 2019-10-25 ENCOUNTER — Other Ambulatory Visit: Payer: Self-pay

## 2019-10-25 ENCOUNTER — Ambulatory Visit: Payer: Medicare Other | Admitting: Family Medicine

## 2019-10-25 ENCOUNTER — Encounter: Payer: Self-pay | Admitting: Family Medicine

## 2019-10-25 VITALS — BP 118/80 | HR 92 | Temp 97.5°F | Resp 16 | Ht 69.0 in | Wt 215.7 lb

## 2019-10-25 DIAGNOSIS — R0600 Dyspnea, unspecified: Secondary | ICD-10-CM

## 2019-10-25 DIAGNOSIS — E1140 Type 2 diabetes mellitus with diabetic neuropathy, unspecified: Secondary | ICD-10-CM | POA: Diagnosis not present

## 2019-10-25 DIAGNOSIS — E782 Mixed hyperlipidemia: Secondary | ICD-10-CM

## 2019-10-25 DIAGNOSIS — E785 Hyperlipidemia, unspecified: Secondary | ICD-10-CM

## 2019-10-25 DIAGNOSIS — F339 Major depressive disorder, recurrent, unspecified: Secondary | ICD-10-CM

## 2019-10-25 DIAGNOSIS — D508 Other iron deficiency anemias: Secondary | ICD-10-CM | POA: Diagnosis not present

## 2019-10-25 DIAGNOSIS — R0609 Other forms of dyspnea: Secondary | ICD-10-CM

## 2019-10-25 DIAGNOSIS — K219 Gastro-esophageal reflux disease without esophagitis: Secondary | ICD-10-CM

## 2019-10-25 DIAGNOSIS — F411 Generalized anxiety disorder: Secondary | ICD-10-CM

## 2019-10-25 DIAGNOSIS — E1169 Type 2 diabetes mellitus with other specified complication: Secondary | ICD-10-CM

## 2019-10-25 LAB — POCT GLYCOSYLATED HEMOGLOBIN (HGB A1C): Hemoglobin A1C: 6.2 % — AB (ref 4.0–5.6)

## 2019-10-25 MED ORDER — AMPHETAMINE-DEXTROAMPHETAMINE 15 MG PO TABS: 15.0000 mg | ORAL_TABLET | Freq: Two times a day (BID) | ORAL | 0 refills | Status: DC

## 2019-10-25 MED ORDER — PREGABALIN 50 MG PO CAPS: 50.0000 mg | ORAL_CAPSULE | Freq: Three times a day (TID) | ORAL | 0 refills | Status: DC

## 2019-10-25 MED ORDER — OMEGA-3-ACID ETHYL ESTERS 1 G PO CAPS: 2.0000 g | ORAL_CAPSULE | Freq: Two times a day (BID) | ORAL | 1 refills | Status: DC

## 2019-10-25 MED ORDER — ALPRAZOLAM 1 MG PO TABS: 0.5000 mg | ORAL_TABLET | Freq: Every evening | ORAL | 0 refills | Status: DC | PRN

## 2019-10-25 MED ORDER — FERROUS SULFATE 325 (65 FE) MG PO TBEC: 325.0000 mg | DELAYED_RELEASE_TABLET | Freq: Two times a day (BID) | ORAL | 0 refills | Status: AC

## 2019-10-25 NOTE — Progress Notes (Signed)
Name: Shane Boyd.   MRN: [id number]    DOB: 1951/05/17   Date:10/25/2019       Progress Note  Subjective  Chief Complaint  Chief Complaint  Patient presents with  . Diabetes  . Dyslipidemia  . Anxiety  . Gastroesophageal Reflux    HPI  GERD: he isstill taking Omeprazole in am, and taking two Famotidines at night prn and is doing well at thsi time, controlling symptoms. No longer waking up in the middle of the night.   Dyslipidemia : he is taking Lovaza BIDand atorvastatin, he states tolerating medication well, no myalgia.Last LDL was 47. Continue medication   DMII with ED: he is taking Metformin twice daily, denies polyphagia, polydipsia or polyuria. He has ED . His hgbA1C was at goal for a long time, last A1C was 6.5% six months ago but today it was up to 8.6% , he was  not as active because COVID-19, today A1C is back to 6.2% again. He has a history of callus. Glucose at home has been 120-125 lately  He has noticed pain on his toes intermittently and sensation of insects going over his feet intermittently, he would like something for the pain.  Dizziness: he has episodes of dizziness that can happen any time of the day, associated with nausea and vomiting, it can last daily. It is described as the room spinning. He denies tinnitus or associated headaches. Seen by Dr. Melrose Nakayama in the past and was given Pamelor but episodes still present, but no headache. He has been symptoms free for months now   ED/BPH: taking generic Cialis 5 mg daily and is doing well. He has noticed prostate symptoms have improved   Major Depressive Disorder:he is still taking Citalopram, very seldom taking alprazolam now, he states retirement has been good to him. They are okay spending holidays alone   GAD: discussed importance of going down on BZD dose. He still feels anxious, denies recent panic attack., fidgety,he is now onAlprazolam 1 mg to take prn and #45 pills to last 3 monthssince  01/20019and now he is only taking it prn, and was down to 30 pills for three months but  he ran out last week, so we ncreased to 35 pills on his last visit and he only has a couple of pills left so we will give the same amount   Hearing loss: he was advised to have hearing aid, but he decided to by a [place] hear phones.Unchanged   Anemia Iron deficiency: he was seen by Dr. Durwin Reges and he needs to have EGD and colonoscopy, but because he felt SOB when he was cleaning his yard and he needs a cardiac clearance prior to having the procedures. He states that since he was seen by GI he got a larger blower and is doing well.   SOB with activity: he states it was happening when cleaning his yard, but not recently, not associated with chest pain or diaphoresis   Patient Active Problem List   Diagnosis Date Noted  . 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 Right ureteral stone 08/15/2018  . Bilateral inguinal hernia without obstruction or gangrene 08/13/2018  . Fatty liver 08/13/2018  . Difficulty balancing 05/16/2017  . Special screening for malignant neoplasms, colon   . Benign neoplasm of ascending colon   . Benign neoplasm of transverse colon   . Hearing loss, sensorineural, unilateral 10/21/2016  . Dyslipidemia with low high density lipoprotein (HDL) cholesterol with hypertriglyceridemia due  to type 2 diabetes mellitus ([place]) 09/16/2016  . GAD (generalized anxiety disorder) 09/16/2016  . Genital herpes 06/08/2016  . Dizziness 03/09/2016  . History of nonmelanoma skin cancer 09/30/2015  . Neck pain 07/29/2015  . Anxiety 07/19/2015  . Insomnia, persistent 07/19/2015  . CN (constipation) 07/19/2015  . 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 Dyslipidemia 07/19/2015  . Gastric reflux 07/19/2015  . History of shingles 07/19/2015  . History of basal cell cancer 07/19/2015  . H/O Malignant melanoma 07/19/2015  . Personal history of  malignant neoplasm of head and neck 07/19/2015  . Chronic recurrent major depressive disorder ([place]) 07/19/2015  . Obesity (BMI 30.0-34.9) 07/19/2015  . ED (erectile dysfunction) 07/19/2015    Past Surgical History:  Procedure Laterality Date  . ARM SKIN LESION BIOPSY / EXCISION Right 05/18/2017  . COLONOSCOPY    . COLONOSCOPY WITH PROPOFOL N/A 12/15/2016   Procedure: COLONOSCOPY WITH PROPOFOL;  Surgeon: Lucilla Lame, MD;  Location: [facility];  Service: Endoscopy;  Laterality: N/A;  diabetic - oral meds  . NASAL SEPTUM SURGERY    . POLYPECTOMY  12/15/2016   Procedure: POLYPECTOMY;  Surgeon: Lucilla Lame, MD;  Location: [facility];  Service: Endoscopy;;  . SENTINEL NODE BIOPSY Right 05/18/2017   [facility]    Family History  Problem Relation Age of Onset  . Diabetes Mother   . Heart disease Mother   . Hyperlipidemia Mother   . CVA Father   . Cancer Father        Colon  . Depression Father     Social History   Socioeconomic History  . Marital status: Married    Spouse name: Neoma Laming  . Number of children: 1  . Years of education: Not on file  . Highest education level: Master's degree (e.g., MA, MS, MEng, MEd, MSW, MBA)  Occupational History  . Occupation: retired  Scientific laboratory technician  . Financial resource strain: Not hard at all  . Food insecurity    Worry: Never true    Inability: Never true  . Transportation needs    Medical: No    Non-medical: No  Tobacco Use  . Smoking status: Never Smoker  . Smokeless tobacco: Never Used  Substance and Sexual Activity  . Alcohol use: Yes    Alcohol/week: 0.0 standard drinks    Comment: Holidays  . Drug use: No  . Sexual activity: Yes    Partners: Female  Lifestyle  . Physical activity    Days per week: 7 days    Minutes per session: 40 min  . Stress: Not at all  Relationships  . Social connections    Talks on phone: More than three times a week    Gets together: Three times a week    Attends religious service:  Never    Active member of club or organization: No    Attends meetings of clubs or organizations: Never    Relationship status: Married  . Intimate partner violence    Fear of current or ex partner: No    Emotionally abused: No    Physically abused: No    Forced sexual activity: No  Other Topics Concern  . Not on file  Social History Narrative   One son: [name] lives in [place]   One son Trip , died from possible suicide while living in [place]     Current Outpatient Medications:  .  ALPRAZolam (XANAX) 1 MG tablet, Take 0.5-1 tablets (0.5-1 mg total) by  mouth at bedtime as needed for anxiety., Disp: 35 tablet, Rfl: 0 .  amphetamine-dextroamphetamine (ADDERALL) 15 MG tablet, Take 1 tablet by mouth 2 (two) times daily., Disp: 60 tablet, Rfl: 0 .  amphetamine-dextroamphetamine (ADDERALL) 15 MG tablet, Take 1 tablet by mouth 2 (two) times daily., Disp: 60 tablet, Rfl: 0 .  amphetamine-dextroamphetamine (ADDERALL) 15 MG tablet, Take 1 tablet by mouth 2 (two) times daily., Disp: 60 tablet, Rfl: 0 .  atorvastatin (LIPITOR) 80 MG tablet, Take 1 tablet (80 mg total) by mouth daily., Disp: 90 tablet, Rfl: 1 .  buPROPion (WELLBUTRIN XL) 300 MG 24 hr tablet, Take 1 tablet (300 mg total) by mouth daily., Disp: 90 tablet, Rfl: 1 .  docusate sodium (COLACE) 100 MG capsule, Take 1 capsule (100 mg total) by mouth 2 (two) times daily., Disp: 30 capsule, Rfl: 0 .  famotidine (PEPCID) 20 MG tablet, Take 1 tablet (20 mg total) by mouth at bedtime., Disp: 90 tablet, Rfl: 1 .  fluoruracil (CARAC) 0.5 % cream, Apply to affected areas of scalp, foreheard and temples twice dialy for 4-5 days., Disp: , Rfl: 0 .  ibuprofen (MOTRIN IB) 200 MG tablet, Take 3 tablets (600 mg total) by mouth every 6 (six) hours as needed., Disp: 20 tablet, Rfl: 0 .  metFORMIN (GLUCOPHAGE XR) 750 MG 24 hr tablet, Take 2 tablets (1,500 mg total) by mouth daily with breakfast., Disp: 180 tablet, Rfl: 1 .  omega-3 acid ethyl esters (LOVAZA)  1 g capsule, Take 2 capsules (2 g total) by mouth 2 (two) times daily., Disp: 480 capsule, Rfl: 1 .  omeprazole (PRILOSEC) 40 MG capsule, Take 1 capsule (40 mg total) by mouth daily., Disp: 90 capsule, Rfl: 1 .  tadalafil (CIALIS) 5 MG tablet, Take 1 tablet (5 mg total) by mouth daily as needed for erectile dysfunction., Disp: 90 tablet, Rfl: 1 .  tamsulosin (FLOMAX) 0.4 MG CAPS capsule, Take 1 capsule (0.4 mg total) by mouth daily., Disp: 90 capsule, Rfl: 1 .  valACYclovir (VALTREX) 500 MG tablet, TAKE ONE CAPLET BY MOUTH THREE TIMES DAILY, Disp: 90 tablet, Rfl: 1 .  ferrous sulfate 325 (65 FE) MG EC tablet, Take 1 tablet (325 mg total) by mouth 2 (two) times daily., Disp: 180 tablet, Rfl: 0 .  pregabalin (LYRICA) 50 MG capsule, Take 1 capsule (50 mg total) by mouth 3 (three) times daily., Disp: 90 capsule, Rfl: 0  No Known Allergies  I personally reviewed active problem list, medication list, allergies, family history, social history, health maintenance with the patient/caregiver today.   ROS  Constitutional: Negative for fever or weight change.  Respiratory: Negative for cough and shortness of breath.   Cardiovascular: Negative for chest pain or palpitations.  Gastrointestinal: Negative for abdominal pain, no bowel changes.  Musculoskeletal: Negative for gait problem or joint swelling.  Skin: Negative for rash.  Neurological: Negative for dizziness or headache.  No other specific complaints in a complete review of systems (except as listed in HPI above).  Objective  Vitals:   10/25/19 1140  BP: 118/80  Pulse: 92  Resp: 16  Temp: (!) 97.5 F (36.4 C)  TempSrc: Temporal  SpO2: 98%  Weight: 215 lb 11.2 oz (97.8 kg)  Height: 5\' 9"  (1.753 m)    Body mass index is 31.85 kg/m.  Physical Exam  Constitutional: Patient appears well-developed and well-nourished. Obese No distress.  HEENT: head atraumatic, normocephalic, pupils equal and reactive to light Cardiovascular: Normal  rate, regular rhythm and normal heart sounds.  No murmur heard. No BLE edema. Pulmonary/Chest: Effort normal and breath sounds normal. No respiratory distress. Abdominal: Soft.  There is no tenderness. Psychiatric: Patient has a normal mood and affect. behavior is normal. Judgment and thought content normal.  Recent Results (from the past 2160 hour(s))  Fructosamine     Status: None   Collection Time: 09/05/19 12:00 AM  Result Value Ref Range   Fructosamine 225 205 - 285 umol/L  COMPLETE METABOLIC PANEL WITH GFR     Status: Abnormal   Collection Time: 09/05/19 12:00 AM  Result Value Ref Range   Glucose, Bld 113 (H) 65 - 99 mg/dL    Comment: .            Fasting reference interval . For someone without known diabetes, a glucose value between 100 and 125 mg/dL is consistent with prediabetes and should be confirmed with a follow-up test. .    BUN 15 7 - 25 mg/dL   Creat 1.14 0.70 - 1.18 mg/dL    Comment: For patients >23 years of age, the reference limit for Creatinine is approximately 13% higher for people identified as African-American. .    GFR, Est Non African American 65 > OR = 60 mL/min/1.69m2   GFR, Est African American 75 > OR = 60 mL/min/1.28m2   BUN/Creatinine Ratio NOT APPLICABLE 6 - 22 (calc)   Sodium 139 135 - 146 mmol/L   Potassium 4.7 3.5 - 5.3 mmol/L   Chloride 104 98 - 110 mmol/L   CO2 27 20 - 32 mmol/L   Calcium 9.5 8.6 - 10.3 mg/dL   Total Protein 6.5 6.1 - 8.1 g/dL   Albumin 4.5 3.6 - 5.1 g/dL   Globulin 2.0 1.9 - 3.7 g/dL (calc)   AG Ratio 2.3 1.0 - 2.5 (calc)   Total Bilirubin 0.3 0.2 - 1.2 mg/dL   Alkaline phosphatase (APISO) 76 35 - 144 U/L   AST 15 10 - 35 U/L   ALT 13 9 - 46 U/L  CBC with Differential/Platelet     Status: Abnormal   Collection Time: 09/05/19 12:00 AM  Result Value Ref Range   WBC 6.5 3.8 - 10.8 Thousand/uL   RBC 4.54 4.20 - 5.80 Million/uL   Hemoglobin 11.5 (L) 13.2 - 17.1 g/dL   HCT 35.0 (L) 38.5 - 50.0 %   MCV 77.1 (L)  80.0 - 100.0 fL   MCH 25.3 (L) 27.0 - 33.0 pg   MCHC 32.9 32.0 - 36.0 g/dL   RDW 16.4 (H) 11.0 - 15.0 %   Platelets 230 140 - 400 Thousand/uL   MPV 11.2 7.5 - 12.5 fL   Neutro Abs 3,309 1,500 - 7,800 cells/uL   Lymphs Abs 2,464 850 - 3,900 cells/uL   Absolute Monocytes 436 200 - 950 cells/uL   Eosinophils Absolute 241 15 - 500 cells/uL   Basophils Absolute 52 0 - 200 cells/uL   Neutrophils Relative % 50.9 %   Total Lymphocyte 37.9 %   Monocytes Relative 6.7 %   Eosinophils Relative 3.7 %   Basophils Relative 0.8 %  Lipid panel     Status: Abnormal   Collection Time: 09/05/19 12:00 AM  Result Value Ref Range   Cholesterol 116 <200 mg/dL   HDL 45 > OR = 40 mg/dL   Triglycerides 160 (H) <150 mg/dL   LDL Cholesterol (Calc) 47 mg/dL (calc)    Comment: Reference range: <100 . Desirable range <100 mg/dL for primary prevention;   <70 mg/dL for patients with  CHD or diabetic patients  with > or = 2 CHD risk factors. Marland Kitchen LDL-C is now calculated using the Martin-Hopkins  calculation, which is a validated novel method providing  better accuracy than the Friedewald equation in the  estimation of LDL-C.  Cresenciano Genre et al. Annamaria Helling. [id number]): 2061-2068  (http://education.[facility].com/faq/FAQ164)    Total CHOL/HDL Ratio 2.6 <5.0 (calc)   Non-HDL Cholesterol (Calc) 71 <130 mg/dL (calc)    Comment: For patients with diabetes plus 1 major ASCVD risk  factor, treating to a non-HDL-C goal of <100 mg/dL  (LDL-C of <70 mg/dL) is considered a therapeutic  option.   Iron, TIBC and Ferritin Panel     Status: Abnormal   Collection Time: 09/05/19 12:00 AM  Result Value Ref Range   Iron 53 50 - 180 mcg/dL   TIBC 371 250 - 425 mcg/dL (calc)   %SAT 14 (L) 20 - 48 % (calc)   Ferritin 6 (L) 24 - 380 ng/mL  TEST AUTHORIZATION     Status: None   Collection Time: 09/05/19 12:00 AM  Result Value Ref Range   TEST NAME: IRON, TIBC AND FERRITIN PANEL    TEST CODE: [id number]    CLIENT CONTACT:  DARNEISHIA Jimmye Norman    REPORT ALWAYS MESSAGE SIGNATURE      Comment: . The laboratory testing on this patient was verbally requested or confirmed by the ordering physician or his or her authorized representative after contact with an employee of [employer]. Federal regulations require that we maintain on file written authorization for all laboratory testing.  Accordingly we are asking that the ordering physician or his or her authorized representative sign a copy of this report and promptly return it to the client service representative. . . Signature:____________________________________________________ . Please fax this signed page to [phone] or return it via your [employer] courier.   POCT HgB A1C     Status: Abnormal   Collection Time: 10/25/19 11:59 AM  Result Value Ref Range   Hemoglobin A1C 6.2 (A) 4.0 - 5.6 %   HbA1c POC (<> result, manual entry)     HbA1c, POC (prediabetic range)     HbA1c, POC (controlled diabetic range)     Diabetic Foot Exam - Simple   Simple Foot Form Diabetic Foot exam was performed with the following findings: Yes 10/25/2019 12:23 PM  Visual Inspection See comments: Yes Sensation Testing Intact to touch and monofilament testing bilaterally: Yes Pulse Check Posterior Tibialis and Dorsalis pulse intact bilaterally: Yes Comments Callus formation both feet      PHQ2/9: Depression screen [facility] 2/9 10/25/2019 07/24/2019 04/22/2019 01/22/2019 10/17/2018  Decreased Interest 0 0 0 0 0  Down, Depressed, Hopeless 0 1 0 0 0  PHQ - 2 Score 0 1 0 0 0  Altered sleeping 0 0 0 0 0  Tired, decreased energy 0 1 1 1 2   Change in appetite 0 0 0 0 0  Feeling bad or failure about yourself  0 0 0 0 0  Trouble concentrating 0 0 0 0 0  Moving slowly or fidgety/restless 0 0 0 0 0  Suicidal thoughts 0 0 0 0 0  PHQ-9 Score 0 2 1 1 2   Difficult doing work/chores - Not difficult at all Not difficult at all Not difficult at all Not difficult at all   Some recent data might be hidden    phq 9 is negative   Fall Risk: Fall Risk  10/25/2019 07/24/2019 04/22/2019 01/22/2019 10/17/2018  Falls in the past  year? - 0 0 0 0  Number falls in past yr: 0 0 0 - -  Injury with Fall? 0 0 0 - -  Comment - - - - -     Functional Status Survey: Is the patient deaf or have difficulty hearing?: No Does the patient have difficulty seeing, even when wearing glasses/contacts?: No Does the patient have difficulty concentrating, remembering, or making decisions?: No Does the patient have difficulty walking or climbing stairs?: No Does the patient have difficulty dressing or bathing?: No Does the patient have difficulty doing errands alone such as visiting a doctor's office or shopping?: No    Assessment & Plan  1. 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 Dyspnea on exertion  - Ambulatory referral to Cardiology  3. Other iron deficiency anemia  Advised to take some iron pills until the week before colonoscopy and EGD  4. Gastric reflux  stable  5. Dyslipidemia  On statin therapy   6. Dyslipidemia with low high density lipoprotein (HDL) cholesterol 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)  - omega-3 acid ethyl esters (LOVAZA) 1 g capsule; Take 2 capsules (2 g total) by mouth 2 (two) times daily.  Dispense: 480 capsule; Refill: 1  7. Chronic recurrent major depressive disorder (HCC)  - amphetamine-dextroamphetamine (ADDERALL) 15 MG tablet; Take 1 tablet by mouth 2 (two) times daily.  Dispense: 60 tablet; Refill: 0 - amphetamine-dextroamphetamine (ADDERALL) 15 MG tablet; Take 1 tablet by mouth 2 (two) times daily.  Dispense: 60 tablet; Refill: 0 - amphetamine-dextroamphetamine (ADDERALL) 15 MG tablet; Take 1 tablet by mouth 2 (two) times daily.  Dispense: 60 tablet; Refill: 0  8. GAD (generalized anxiety disorder)  - ALPRAZolam (XANAX) 1 MG tablet; Take 0.5-1  tablets (0.5-1 mg total) by mouth at bedtime as needed for anxiety.  Dispense: 35 tablet; Refill: 0

## 2019-11-08 ENCOUNTER — Other Ambulatory Visit: Payer: Self-pay

## 2019-11-08 ENCOUNTER — Ambulatory Visit (INDEPENDENT_AMBULATORY_CARE_PROVIDER_SITE_OTHER): Payer: Medicare Other | Admitting: Cardiology

## 2019-11-08 ENCOUNTER — Encounter: Payer: Self-pay | Admitting: Cardiology

## 2019-11-08 VITALS — BP 120/80 | HR 76 | Temp 97.4°F | Ht 69.0 in | Wt 217.2 lb

## 2019-11-08 DIAGNOSIS — R0600 Dyspnea, unspecified: Secondary | ICD-10-CM

## 2019-11-08 DIAGNOSIS — E7800 Pure hypercholesterolemia, unspecified: Secondary | ICD-10-CM

## 2019-11-08 DIAGNOSIS — R0609 Other forms of dyspnea: Secondary | ICD-10-CM

## 2019-11-08 NOTE — Progress Notes (Signed)
Cardiology Office Note:    Date:  11/08/2019   ID:  Shane Boyd., DOB 10-15-1951, MRN [id number]  PCP:  Steele Sizer, MD  Cardiologist:  Kate Sable, MD  Electrophysiologist:  None   Referring MD: Steele Sizer, MD   Chief Complaint  Patient presents with   New Patient (Initial Visit)    DOE; Meds verbally reviewed with patient.    History of Present Illness:    Shane Boyd. is a 68 y.o. male with a hx of hyperlipidemia, diabetes, GERD who presents due to shortness of breath.  Patient states symptoms of shortness of breath started about 2 months ago.  He has a lot of trees around his home and he leaves in his yard due to the fall.  He has noticed shortness of breath when he bends to carry the leaves or whenever he tries to shovel.  He denies shortness of breath with ordinary exertion such as walking.  He denies any chest pain, palpitations, edema, syncope.  Denies ever smoking.  His father had CAD with stent in his 43s.  Past Medical History:  Diagnosis Date   Anemia    Anxiety    Chronic insomnia    Constipation    Depression    Diabetes mellitus without complication ([place])    Encounter for long-term (current) use of other high-risk medications    GERD (gastroesophageal reflux disease)    History of malignant melanoma    Dr. Koleen Nimrod   History of squamous cell carcinoma    Hx of basal cell carcinoma    Hyperlipidemia    Leukocytosis    Obesity    Other male erectile dysfunction    Vertigo    1-2x/yr    Past Surgical History:  Procedure Laterality Date   ARM SKIN LESION BIOPSY / EXCISION Right 05/18/2017   COLONOSCOPY     COLONOSCOPY WITH PROPOFOL N/A 12/15/2016   Procedure: COLONOSCOPY WITH PROPOFOL;  Surgeon: Lucilla Lame, MD;  Location: [facility];  Service: Endoscopy;  Laterality: N/A;  diabetic - oral meds   NASAL SEPTUM SURGERY     POLYPECTOMY  12/15/2016   Procedure: POLYPECTOMY;  Surgeon: Lucilla Lame, MD;   Location: [facility];  Service: Endoscopy;;   SENTINEL NODE BIOPSY Right 05/18/2017   [facility]    Current Medications: Current Meds  Medication Sig   ALPRAZolam (XANAX) 1 MG tablet Take 0.5-1 tablets (0.5-1 mg total) by mouth at bedtime as needed for anxiety.   amphetamine-dextroamphetamine (ADDERALL) 15 MG tablet Take 1 tablet by mouth 2 (two) times daily.   atorvastatin (LIPITOR) 80 MG tablet Take 1 tablet (80 mg total) by mouth daily.   buPROPion (WELLBUTRIN XL) 300 MG 24 hr tablet Take 1 tablet (300 mg total) by mouth daily.   docusate sodium (COLACE) 100 MG capsule Take 1 capsule (100 mg total) by mouth 2 (two) times daily.   famotidine (PEPCID) 20 MG tablet Take 1 tablet (20 mg total) by mouth at bedtime.   ferrous sulfate 325 (65 FE) MG EC tablet Take 1 tablet (325 mg total) by mouth 2 (two) times daily.   fluoruracil (CARAC) 0.5 % cream Apply to affected areas of scalp, foreheard and temples twice dialy for 4-5 days.   ibuprofen (MOTRIN IB) 200 MG tablet Take 3 tablets (600 mg total) by mouth every 6 (six) hours as needed.   metFORMIN (GLUCOPHAGE XR) 750 MG 24 hr tablet Take 2 tablets (1,500 mg total) by mouth daily with breakfast.  omega-3 acid ethyl esters (LOVAZA) 1 g capsule Take 2 capsules (2 g total) by mouth 2 (two) times daily.   omeprazole (PRILOSEC) 40 MG capsule Take 1 capsule (40 mg total) by mouth daily.   pregabalin (LYRICA) 50 MG capsule Take 1 capsule (50 mg total) by mouth 3 (three) times daily.   tadalafil (CIALIS) 5 MG tablet Take 1 tablet (5 mg total) by mouth daily as needed for erectile dysfunction.   tamsulosin (FLOMAX) 0.4 MG CAPS capsule Take 1 capsule (0.4 mg total) by mouth daily.   valACYclovir (VALTREX) 500 MG tablet TAKE ONE CAPLET BY MOUTH THREE TIMES DAILY     Allergies:   Patient has no known allergies.   Social History   Socioeconomic History   Marital status: Married    Spouse name: Neoma Laming   Number of children: 1     Years of education: Not on file   Highest education level: Master's degree (e.g., MA, MS, MEng, MEd, MSW, MBA)  Occupational History   Occupation: retired  Scientist, product/process development strain: Not hard at [employer]    Worry: Never true    Inability: Never true   Transportation needs    Medical: No    Non-medical: No  Tobacco Use   Smoking status: Never Smoker   Smokeless tobacco: Never Used  Substance and Sexual Activity   Alcohol use: Yes    Alcohol/week: 0.0 standard drinks    Comment: Holidays   Drug use: No   Sexual activity: Yes    Partners: Female  Lifestyle   Physical activity    Days per week: 7 days    Minutes per session: 40 min   Stress: Not at all  Relationships   Social connections    Talks on phone: More than three times a week    Gets together: Three times a week    Attends religious service: Never    Active member of club or organization: No    Attends meetings of clubs or organizations: Never    Relationship status: Married  Other Topics Concern   Not on file  Social History Narrative   One son: [name] lives in [place]   One son Trip , died from possible suicide while living in [place]     Family History: The patient's family history includes CVA in his father; Cancer in his father; Depression in his father; Diabetes in his mother; Heart disease in his mother; Hyperlipidemia in his mother.  ROS:   Please see the history of present illness.     All other systems reviewed and are negative.  EKGs/Labs/Other Studies Reviewed:    The following studies were reviewed today:   EKG:  EKG is  ordered today.  The ekg ordered today demonstrates normal sinus rhythm, normal ECG.  Recent Labs: 09/05/2019: ALT 13; BUN 15; Creat 1.14; Hemoglobin 11.5; Platelets 230; Potassium 4.7; Sodium 139  Recent Lipid Panel    Component Value Date/Time   CHOL 116 09/05/2019 0000   CHOL 148 11/06/2015 0832   TRIG 160 (H) 09/05/2019 0000    HDL 45 09/05/2019 0000   HDL 39 (L) 11/06/2015 0832   CHOLHDL 2.6 09/05/2019 0000   VLDL 53 (H) 04/12/2017 0855   LDLCALC 47 09/05/2019 0000    Physical Exam:    VS:  BP 120/80 (BP Location: Right Arm, Patient Position: Sitting, Cuff Size: Normal)    Pulse 76    Temp (!) 97.4 F (36.3 C)  Ht 5\' 9"  (1.753 m)    Wt 217 lb 4 oz (98.5 kg)    SpO2 96%    BMI 32.08 kg/m     Wt Readings from Last 3 Encounters:  11/08/19 217 lb 4 oz (98.5 kg)  10/25/19 215 lb 11.2 oz (97.8 kg)  10/17/19 209 lb 12.8 oz (95.2 kg)     GEN:  Well nourished, well developed in no acute distress HEENT: Normal NECK: No JVD; No carotid bruits LYMPHATICS: No lymphadenopathy CARDIAC: RRR, no murmurs, rubs, gallops RESPIRATORY:  Clear to auscultation without rales, wheezing or rhonchi  ABDOMEN: Soft, non-tender, non-distended MUSCULOSKELETAL:  No edema; No deformity  SKIN: Warm and dry NEUROLOGIC:  Alert and oriented x 3 PSYCHIATRIC:  Normal affect   ASSESSMENT:   Patient with dyspnea on exertion.  He has risk factors of hyperlipidemia, diabetes, age, history of CAD in his dad at age 37s.  Symptoms could be secondary to CAD although deconditioning due to not doing much as a result of the pandemic could be contributing. 1. Dyspnea on exertion   2. Pure hypercholesterolemia    PLAN:    In order of problems listed above:  1. Get echocardiogram, Lexiscan myocardial perfusion stress test. 2. Continue Lipitor as currently prescribed.  Follow-up after echo and stress test   Medication Adjustments/Labs and Tests Ordered: Current medicines are reviewed at length with the patient today.  Concerns regarding medicines are outlined above.  Orders Placed This Encounter  Procedures   NM Myocar Multi W/Spect W/Wall Motion / EF   EKG 12-Lead   ECHOCARDIOGRAM COMPLETE   No orders of the defined types were placed in this encounter.   Patient Instructions  Medication Instructions:  - Your physician  recommends that you continue on your current medications as directed. Please refer to the Current Medication list given to you today.  *If you need a refill on your cardiac medications before your next appointment, please call your pharmacy*  Lab Work: - none ordered  If you have labs (blood work) drawn today and your tests are completely normal, you will receive your results only by:  [place] (if you have MyChart) OR  A paper copy in the mail If you have any lab test that is abnormal or we need to change your treatment, we will call you to review the results.  Testing/Procedures: - Your physician has requested that you have an echocardiogram. Echocardiography is a painless test that uses sound waves to create images of your heart. It provides your doctor with information about the size and shape of your heart and how well your hearts chambers and valves are working. This procedure takes approximately one hour. There are no restrictions for this procedure.  - Your physician has requested that you have a lexiscan myoview.   [facility]  Your caregiver has ordered a Stress Test with nuclear imaging. The purpose of this test is to evaluate the blood supply to your heart muscle. This procedure is referred to as a "Non-Invasive Stress Test." This is because other than having an IV started in your vein, nothing is inserted or "invades" your body. Cardiac stress tests are done to find areas of poor blood flow to the heart by determining the extent of coronary artery disease (CAD). Some patients exercise on a treadmill, which naturally increases the blood flow to your heart, while others who are  unable to walk on a treadmill due to physical limitations have a pharmacologic/chemical stress agent called Lexiscan .  This medicine will mimic walking on a treadmill by temporarily increasing your coronary blood flow.   Please note: these test may take anywhere between 2-4 hours to  complete  PLEASE REPORT TO [facility] AT THE FIRST DESK WILL DIRECT YOU WHERE TO GO  Date of Procedure:_____________________________________  Arrival Time for Procedure:______________________________  Instructions regarding medication:   __x__ : Hold diabetes medication morning of procedure (Metformin)   __x___: Dennis Bast may take all of your other medications the morning of your test with enough water to get them down safely  PLEASE NOTIFY THE OFFICE AT LEAST 24 HOURS IN ADVANCE IF YOU ARE UNABLE TO [facility].  [phone] AND  PLEASE NOTIFY NUCLEAR MEDICINE AT [facility] AT LEAST 24 HOURS IN ADVANCE IF YOU ARE UNABLE TO KEEP YOUR APPOINTMENT. [phone]  How to prepare for your Myoview test:  1. Do not eat or drink after midnight 2. No caffeine for 24 hours prior to test 3. No smoking 24 hours prior to test. 4. Your medication may be taken with water.  If your doctor stopped a medication because of this test, do not take that medication. 5. Ladies, please do not wear dresses.  Skirts or pants are appropriate. Please wear a short sleeve shirt. 6. No perfume, cologne or lotion. 7. Wear comfortable walking shoes. No heels!  Follow-Up: At [facility], you and your health needs are our priority.  As part of our continuing mission to provide you with exceptional heart care, we have created designated Provider Care Teams.  These Care Teams include your primary Cardiologist (physician) and Advanced Practice Providers (APPs -  Physician Assistants and Nurse Practitioners) who all work together to provide you with the care you need, when you need it.  Your next appointment:   3 month(s)  The format for your next appointment:   In Person  Provider:   Kate Sable, MD  Other Instructions n/a    Echocardiogram An echocardiogram is a procedure that uses painless sound waves (ultrasound) to produce an image of the heart. Images from an  echocardiogram can provide important information about:  Signs of coronary artery disease (CAD).  Aneurysm detection. An aneurysm is a weak or damaged part of an artery wall that bulges out from the normal force of blood pumping through the body.  Heart size and shape. Changes in the size or shape of the heart can be associated with certain conditions, including heart failure, aneurysm, and CAD.  Heart muscle function.  Heart valve function.  Signs of a past heart attack.  Fluid buildup around the heart.  Thickening of the heart muscle.  A tumor or infectious growth around the heart valves. Tell a health care provider about:  Any allergies you have.  All medicines you are taking, including vitamins, herbs, eye drops, creams, and over-the-counter medicines.  Any blood disorders you have.  Any surgeries you have had.  Any medical conditions you have.  Whether you are pregnant or may be pregnant. What are the risks? Generally, this is a safe procedure. However, problems may occur, including:  Allergic reaction to dye (contrast) that may be used during the procedure. What happens before the procedure? No specific preparation is needed. You may eat and drink normally. What happens during the procedure?   An IV tube may be inserted into one of your veins.  You may receive contrast through this tube. A contrast is an injection that improves the quality of the pictures from  your heart.  A gel will be applied to your chest.  A wand-like tool (transducer) will be moved over your chest. The gel will help to transmit the sound waves from the transducer.  The sound waves will harmlessly bounce off of your heart to allow the heart images to be captured in real-time motion. The images will be recorded on a computer. The procedure may vary among health care providers and hospitals. What happens after the procedure?  You may return to your normal, everyday life, including diet,  activities, and medicines, unless your health care provider tells you not to do that. Summary  An echocardiogram is a procedure that uses painless sound waves (ultrasound) to produce an image of the heart.  Images from an echocardiogram can provide important information about the size and shape of your heart, heart muscle function, heart valve function, and fluid buildup around your heart.  You do not need to do anything to prepare before this procedure. You may eat and drink normally.  After the echocardiogram is completed, you may return to your normal, everyday life, unless your health care provider tells you not to do that. This information is not intended to replace advice given to you by your health care provider. Make sure you discuss any questions you have with your health care provider. Document Released: 11/18/2000 Document Revised: 03/14/2019 Document Reviewed: 12/24/2016 Elsevier Patient Education  2020 [place].    Cardiac Nuclear Scan A cardiac nuclear scan is a test that measures blood flow to the heart when a person is resting and when he or she is exercising. The test looks for problems such as:  Not enough blood reaching a portion of the heart.  The heart muscle not working normally. You may need this test if:  You have heart disease.  You have had abnormal lab results.  You have had heart surgery or a balloon procedure to open up blocked arteries (angioplasty).  You have chest pain.  You have shortness of breath. In this test, a radioactive dye (tracer) is injected into your bloodstream. After the tracer has traveled to your heart, an imaging device is used to measure how much of the tracer is absorbed by or distributed to various areas of your heart. This procedure is usually done at a hospital and takes 2-4 hours. Tell a health care provider about:  Any allergies you have.  All medicines you are taking, including vitamins, herbs, eye drops, creams, and  over-the-counter medicines.  Any problems you or family members have had with anesthetic medicines.  Any blood disorders you have.  Any surgeries you have had.  Any medical conditions you have.  Whether you are pregnant or may be pregnant. What are the risks? Generally, this is a safe procedure. However, problems may occur, including:  Serious chest pain and heart attack. This is only a risk if the stress portion of the test is done.  Rapid heartbeat.  Sensation of warmth in your chest. This usually passes quickly.  Allergic reaction to the tracer. What happens before the procedure?  Ask your health care provider about changing or stopping your regular medicines. This is especially important if you are taking diabetes medicines or blood thinners.  Follow instructions from your health care provider about eating or drinking restrictions.  Remove your jewelry on the day of the procedure. What happens during the procedure?  An IV will be inserted into one of your veins.  Your health care provider will inject a small  amount of radioactive tracer through the IV.  You will wait for 20-40 minutes while the tracer travels through your bloodstream.  Your heart activity will be monitored with an electrocardiogram (ECG).  You will lie down on an exam table.  Images of your heart will be taken for about 15-20 minutes.  You may also have a stress test. For this test, one of the following may be done: ? You will exercise on a treadmill or stationary bike. While you exercise, your heart's activity will be monitored with an ECG, and your blood pressure will be checked. ? You will be given medicines that will increase blood flow to parts of your heart. This is done if you are unable to exercise.  When blood flow to your heart has peaked, a tracer will again be injected through the IV.  After 20-40 minutes, you will get back on the exam table and have more images taken of your  heart.  Depending on the type of tracer used, scans may need to be repeated 3-4 hours later.  Your IV line will be removed when the procedure is over. The procedure may vary among health care providers and hospitals. What happens after the procedure?  Unless your health care provider tells you otherwise, you may return to your normal schedule, including diet, activities, and medicines.  Unless your health care provider tells you otherwise, you may increase your fluid intake. This will help to flush the contrast dye from your body. Drink enough fluid to keep your urine pale yellow.  Ask your health care provider, or the department that is doing the test: ? When will my results be ready? ? How will I get my results? Summary  A cardiac nuclear scan measures the blood flow to the heart when a person is resting and when he or she is exercising.  Tell your health care provider if you are pregnant.  Before the procedure, ask your health care provider about changing or stopping your regular medicines. This is especially important if you are taking diabetes medicines or blood thinners.  After the procedure, unless your health care provider tells you otherwise, increase your fluid intake. This will help flush the contrast dye from your body.  After the procedure, unless your health care provider tells you otherwise, you may return to your normal schedule, including diet, activities, and medicines. This information is not intended to replace advice given to you by your health care provider. Make sure you discuss any questions you have with your health care provider. Document Released: 12/16/2004 Document Revised: 05/07/2018 Document Reviewed: 05/07/2018 Elsevier Patient Education  2020 [place], Kate Sable, MD  11/08/2019 3:46 PM    [place]

## 2019-11-08 NOTE — Patient Instructions (Addendum)
Medication Instructions:  - Your physician recommends that you continue on your current medications as directed. Please refer to the Current Medication list given to you today.  *If you need a refill on your cardiac medications before your next appointment, please call your pharmacy*  Lab Work: - none ordered  If you have labs (blood work) drawn today and your tests are completely normal, you will receive your results only by: Marland Kitchen MyChart Message (if you have MyChart) OR . A paper copy in the mail If you have any lab test that is abnormal or we need to change your treatment, we will call you to review the results.  Testing/Procedures: - Your physician has requested that you have an echocardiogram. Echocardiography is a painless test that uses sound waves to create images of your heart. It provides your doctor with information about the size and shape of your heart and how well your heart's chambers and valves are working. This procedure takes approximately one hour. There are no restrictions for this procedure.  - Your physician has requested that you have a lexiscan myoview.   [place]  Your caregiver has ordered a Stress Test with nuclear imaging. The purpose of this test is to evaluate the blood supply to your heart muscle. This procedure is referred to as a "Non-Invasive Stress Test." This is because other than having an IV started in your vein, nothing is inserted or "invades" your body. Cardiac stress tests are done to find areas of poor blood flow to the heart by determining the extent of coronary artery disease (CAD). Some patients exercise on a treadmill, which naturally increases the blood flow to your heart, while others who are  unable to walk on a treadmill due to physical limitations have a pharmacologic/chemical stress agent called Lexiscan . This medicine will mimic walking on a treadmill by temporarily increasing your coronary blood flow.   Please note: these test may take  anywhere between 2-4 hours to complete  PLEASE REPORT TO [place] AT THE FIRST DESK WILL DIRECT YOU WHERE TO GO  Date of Procedure:_____________________________________  Arrival Time for Procedure:______________________________  Instructions regarding medication:   __x__ : Hold diabetes medication morning of procedure (Metformin)   __x___: Dennis Bast may take all of your other medications the morning of your test with enough water to get them down safely  PLEASE NOTIFY THE OFFICE AT LEAST 24 HOURS IN ADVANCE IF YOU ARE UNABLE TO [place].  [phone] AND  PLEASE NOTIFY NUCLEAR MEDICINE AT [facility] AT LEAST 24 HOURS IN ADVANCE IF YOU ARE UNABLE TO KEEP YOUR APPOINTMENT. [phone]  How to prepare for your Myoview test:  1. Do not eat or drink after midnight 2. No caffeine for 24 hours prior to test 3. No smoking 24 hours prior to test. 4. Your medication may be taken with water.  If your doctor stopped a medication because of this test, do not take that medication. 5. Ladies, please do not wear dresses.  Skirts or pants are appropriate. Please wear a short sleeve shirt. 6. No perfume, cologne or lotion. 7. Wear comfortable walking shoes. No heels!  Follow-Up: At [facility], you and your health needs are our priority.  As part of our continuing mission to provide you with exceptional heart care, we have created designated Provider Care Teams.  These Care Teams include your primary Cardiologist (physician) and Advanced Practice Providers (APPs -  Physician Assistants and Nurse Practitioners) who all work  together to provide you with the care you need, when you need it.  Your next appointment:   3 month(s)  The format for your next appointment:   In Person  Provider:   Kate Sable, MD  Other Instructions n/a    Echocardiogram An echocardiogram is a procedure that uses painless sound waves (ultrasound) to produce an image of  the heart. Images from an echocardiogram can provide important information about:  Signs of coronary artery disease (CAD).  Aneurysm detection. An aneurysm is a weak or damaged part of an artery wall that bulges out from the normal force of blood pumping through the body.  Heart size and shape. Changes in the size or shape of the heart can be associated with certain conditions, including heart failure, aneurysm, and CAD.  Heart muscle function.  Heart valve function.  Signs of a past heart attack.  Fluid buildup around the heart.  Thickening of the heart muscle.  A tumor or infectious growth around the heart valves. Tell a health care provider about:  Any allergies you have.  All medicines you are taking, including vitamins, herbs, eye drops, creams, and over-the-counter medicines.  Any blood disorders you have.  Any surgeries you have had.  Any medical conditions you have.  Whether you are pregnant or may be pregnant. What are the risks? Generally, this is a safe procedure. However, problems may occur, including:  Allergic reaction to dye (contrast) that may be used during the procedure. What happens before the procedure? No specific preparation is needed. You may eat and drink normally. What happens during the procedure?   An IV tube may be inserted into one of your veins.  You may receive contrast through this tube. A contrast is an injection that improves the quality of the pictures from your heart.  A gel will be applied to your chest.  A wand-like tool (transducer) will be moved over your chest. The gel will help to transmit the sound waves from the transducer.  The sound waves will harmlessly bounce off of your heart to allow the heart images to be captured in real-time motion. The images will be recorded on a computer. The procedure may vary among health care providers and hospitals. What happens after the procedure?  You may return to your normal,  everyday life, including diet, activities, and medicines, unless your health care provider tells you not to do that. Summary  An echocardiogram is a procedure that uses painless sound waves (ultrasound) to produce an image of the heart.  Images from an echocardiogram can provide important information about the size and shape of your heart, heart muscle function, heart valve function, and fluid buildup around your heart.  You do not need to do anything to prepare before this procedure. You may eat and drink normally.  After the echocardiogram is completed, you may return to your normal, everyday life, unless your health care provider tells you not to do that. This information is not intended to replace advice given to you by your health care provider. Make sure you discuss any questions you have with your health care provider. Document Released: 11/18/2000 Document Revised: 03/14/2019 Document Reviewed: 12/24/2016 Elsevier Patient Education  2020 [employer].    Cardiac Nuclear Scan A cardiac nuclear scan is a test that measures blood flow to the heart when a person is resting and when he or she is exercising. The test looks for problems such as:  Not enough blood reaching a portion of the  heart.  The heart muscle not working normally. You may need this test if:  You have heart disease.  You have had abnormal lab results.  You have had heart surgery or a balloon procedure to open up blocked arteries (angioplasty).  You have chest pain.  You have shortness of breath. In this test, a radioactive dye (tracer) is injected into your bloodstream. After the tracer has traveled to your heart, an imaging device is used to measure how much of the tracer is absorbed by or distributed to various areas of your heart. This procedure is usually done at a hospital and takes 2-4 hours. Tell a health care provider about:  Any allergies you have.  All medicines you are taking, including vitamins,  herbs, eye drops, creams, and over-the-counter medicines.  Any problems you or family members have had with anesthetic medicines.  Any blood disorders you have.  Any surgeries you have had.  Any medical conditions you have.  Whether you are pregnant or may be pregnant. What are the risks? Generally, this is a safe procedure. However, problems may occur, including:  Serious chest pain and heart attack. This is only a risk if the stress portion of the test is done.  Rapid heartbeat.  Sensation of warmth in your chest. This usually passes quickly.  Allergic reaction to the tracer. What happens before the procedure?  Ask your health care provider about changing or stopping your regular medicines. This is especially important if you are taking diabetes medicines or blood thinners.  Follow instructions from your health care provider about eating or drinking restrictions.  Remove your jewelry on the day of the procedure. What happens during the procedure?  An IV will be inserted into one of your veins.  Your health care provider will inject a small amount of radioactive tracer through the IV.  You will wait for 20-40 minutes while the tracer travels through your bloodstream.  Your heart activity will be monitored with an electrocardiogram (ECG).  You will lie down on an exam table.  Images of your heart will be taken for about 15-20 minutes.  You may also have a stress test. For this test, one of the following may be done: ? You will exercise on a treadmill or stationary bike. While you exercise, your heart's activity will be monitored with an ECG, and your blood pressure will be checked. ? You will be given medicines that will increase blood flow to parts of your heart. This is done if you are unable to exercise.  When blood flow to your heart has peaked, a tracer will again be injected through the IV.  After 20-40 minutes, you will get back on the exam table and have more  images taken of your heart.  Depending on the type of tracer used, scans may need to be repeated 3-4 hours later.  Your IV line will be removed when the procedure is over. The procedure may vary among health care providers and hospitals. What happens after the procedure?  Unless your health care provider tells you otherwise, you may return to your normal schedule, including diet, activities, and medicines.  Unless your health care provider tells you otherwise, you may increase your fluid intake. This will help to flush the contrast dye from your body. Drink enough fluid to keep your urine pale yellow.  Ask your health care provider, or the department that is doing the test: ? When will my results be ready? ? How will I get my  results? Summary  A cardiac nuclear scan measures the blood flow to the heart when a person is resting and when he or she is exercising.  Tell your health care provider if you are pregnant.  Before the procedure, ask your health care provider about changing or stopping your regular medicines. This is especially important if you are taking diabetes medicines or blood thinners.  After the procedure, unless your health care provider tells you otherwise, increase your fluid intake. This will help flush the contrast dye from your body.  After the procedure, unless your health care provider tells you otherwise, you may return to your normal schedule, including diet, activities, and medicines. This information is not intended to replace advice given to you by your health care provider. Make sure you discuss any questions you have with your health care provider. Document Released: 12/16/2004 Document Revised: 05/07/2018 Document Reviewed: 05/07/2018 Elsevier Patient Education  2020 [employer].

## 2019-11-15 ENCOUNTER — Other Ambulatory Visit: Payer: Self-pay

## 2019-11-15 ENCOUNTER — Ambulatory Visit
Admission: RE | Admit: 2019-11-15 | Discharge: 2019-11-15 | Disposition: A | Discharge status: Home / Self Care | Payer: Medicare Other | Source: Ambulatory Visit | Attending: Cardiology | Admitting: Cardiology

## 2019-11-15 DIAGNOSIS — R0600 Dyspnea, unspecified: Secondary | ICD-10-CM | POA: Diagnosis not present

## 2019-11-15 DIAGNOSIS — R0609 Other forms of dyspnea: Secondary | ICD-10-CM

## 2019-11-15 LAB — NM MYOCAR MULTI W/SPECT W/WALL MOTION / EF
Estimated workload: 1 METS
Exercise duration (min): 0 min
Exercise duration (sec): 0 s
LV dias vol: 74 mL (ref 62–150)
LV sys vol: 24 mL
MPHR: 150 {beats}/min
Peak HR: 100 {beats}/min
Percent HR: 66 %
Rest HR: 73 {beats}/min
SDS: 0
SRS: 1
SSS: 0
TID: 1.2

## 2019-11-15 MED ORDER — TECHNETIUM TC 99M TETROFOSMIN IV KIT
10.3900 | PACK | Freq: Once | INTRAVENOUS | Status: AC | PRN
  Administered 2019-11-15: 10.39 via INTRAVENOUS

## 2019-11-15 MED ORDER — TECHNETIUM TC 99M TETROFOSMIN IV KIT
32.4100 | PACK | Freq: Once | INTRAVENOUS | Status: AC | PRN
  Administered 2019-11-15: 32.41 via INTRAVENOUS

## 2019-11-15 MED ORDER — REGADENOSON 0.4 MG/5ML IV SOLN
0.4000 mg | Freq: Once | INTRAVENOUS | Status: AC
  Administered 2019-11-15: 0.4 mg via INTRAVENOUS

## 2019-11-22 ENCOUNTER — Encounter: Payer: Self-pay | Admitting: Family Medicine

## 2019-11-24 ENCOUNTER — Other Ambulatory Visit: Payer: Self-pay | Admitting: Family Medicine

## 2019-11-24 DIAGNOSIS — E1140 Type 2 diabetes mellitus with diabetic neuropathy, unspecified: Secondary | ICD-10-CM

## 2019-12-05 ENCOUNTER — Ambulatory Visit (INDEPENDENT_AMBULATORY_CARE_PROVIDER_SITE_OTHER): Payer: Medicare Other

## 2019-12-05 ENCOUNTER — Other Ambulatory Visit: Payer: Self-pay

## 2019-12-05 DIAGNOSIS — R0609 Other forms of dyspnea: Secondary | ICD-10-CM

## 2019-12-05 DIAGNOSIS — R0600 Dyspnea, unspecified: Secondary | ICD-10-CM | POA: Diagnosis not present

## 2019-12-24 DIAGNOSIS — D0462 Carcinoma in situ of skin of left upper limb, including shoulder: Secondary | ICD-10-CM | POA: Diagnosis not present

## 2020-01-12 ENCOUNTER — Other Ambulatory Visit: Payer: Self-pay | Admitting: Family Medicine

## 2020-01-12 DIAGNOSIS — E1140 Type 2 diabetes mellitus with diabetic neuropathy, unspecified: Secondary | ICD-10-CM

## 2020-01-12 NOTE — Telephone Encounter (Signed)
Requested medication (s) are due for refill today: yes  Requested medication (s) are on the active medication list: yes  Last refill:  11/24/19  Future visit scheduled: yes  Notes to clinic:  Medication not delegated to NT to refill   Requested Prescriptions  Pending Prescriptions Disp Refills   pregabalin (LYRICA) 50 MG capsule [Pharmacy Med Name: Pregabalin 50 MG Oral Capsule] 90 capsule 0    Sig: TAKE 1 CAPSULE BY MOUTH THREE TIMES DAILY      Not Delegated - Neurology:  Anticonvulsants - Controlled Failed - 01/12/2020 11:01 AM      Failed - This refill cannot be delegated      Passed - Valid encounter within last 12 months    Recent Outpatient Visits           2 months ago Type 2 diabetes mellitus with diabetic neuropathy, without long-term current use of insulin [facility])   [facility], Drue Stager, MD   5 months ago 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 [facility])   [facility] Steele Sizer, MD   8 months ago Dyslipidemia with low high density lipoprotein (HDL) cholesterol with hypertriglyceridemia due to type 2 diabetes mellitus [facility])   [facility] Steele Sizer, MD   11 months ago 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

## 2020-01-26 ENCOUNTER — Other Ambulatory Visit: Payer: Self-pay | Admitting: Family Medicine

## 2020-01-26 DIAGNOSIS — E785 Hyperlipidemia, unspecified: Secondary | ICD-10-CM

## 2020-01-26 DIAGNOSIS — N521 Erectile dysfunction due to diseases classified elsewhere: Secondary | ICD-10-CM

## 2020-01-26 DIAGNOSIS — E1140 Type 2 diabetes mellitus with diabetic neuropathy, unspecified: Secondary | ICD-10-CM

## 2020-01-26 DIAGNOSIS — N401 Enlarged prostate with lower urinary tract symptoms: Secondary | ICD-10-CM

## 2020-01-27 DIAGNOSIS — E119 Type 2 diabetes mellitus without complications: Secondary | ICD-10-CM | POA: Diagnosis not present

## 2020-01-27 DIAGNOSIS — H43391 Other vitreous opacities, right eye: Secondary | ICD-10-CM | POA: Diagnosis not present

## 2020-01-27 DIAGNOSIS — H2513 Age-related nuclear cataract, bilateral: Secondary | ICD-10-CM | POA: Diagnosis not present

## 2020-01-27 LAB — HM DIABETES EYE EXAM

## 2020-01-29 ENCOUNTER — Ambulatory Visit (INDEPENDENT_AMBULATORY_CARE_PROVIDER_SITE_OTHER): Payer: Medicare PPO | Admitting: Family Medicine

## 2020-01-29 ENCOUNTER — Encounter: Payer: Self-pay | Admitting: Family Medicine

## 2020-01-29 VITALS — BP 124/70 | Wt 212.0 lb

## 2020-01-29 DIAGNOSIS — E782 Mixed hyperlipidemia: Secondary | ICD-10-CM | POA: Diagnosis not present

## 2020-01-29 DIAGNOSIS — D509 Iron deficiency anemia, unspecified: Secondary | ICD-10-CM | POA: Diagnosis not present

## 2020-01-29 DIAGNOSIS — K219 Gastro-esophageal reflux disease without esophagitis: Secondary | ICD-10-CM | POA: Diagnosis not present

## 2020-01-29 DIAGNOSIS — F339 Major depressive disorder, recurrent, unspecified: Secondary | ICD-10-CM

## 2020-01-29 DIAGNOSIS — F411 Generalized anxiety disorder: Secondary | ICD-10-CM

## 2020-01-29 DIAGNOSIS — E785 Hyperlipidemia, unspecified: Secondary | ICD-10-CM | POA: Diagnosis not present

## 2020-01-29 DIAGNOSIS — E1169 Type 2 diabetes mellitus with other specified complication: Secondary | ICD-10-CM | POA: Diagnosis not present

## 2020-01-29 DIAGNOSIS — E1140 Type 2 diabetes mellitus with diabetic neuropathy, unspecified: Secondary | ICD-10-CM | POA: Diagnosis not present

## 2020-01-29 MED ORDER — BUPROPION HCL ER (XL) 300 MG PO TB24: 300.0000 mg | ORAL_TABLET | Freq: Every day | ORAL | 1 refills | Status: DC

## 2020-01-29 MED ORDER — AMPHETAMINE-DEXTROAMPHETAMINE 15 MG PO TABS: 15.0000 mg | ORAL_TABLET | Freq: Two times a day (BID) | ORAL | 0 refills | Status: DC

## 2020-01-29 MED ORDER — OMEPRAZOLE 40 MG PO CPDR: 40.0000 mg | DELAYED_RELEASE_CAPSULE | Freq: Every evening | ORAL | 1 refills | Status: DC

## 2020-01-29 MED ORDER — FAMOTIDINE 20 MG PO TABS: 20.0000 mg | ORAL_TABLET | Freq: Every day | ORAL | 1 refills | Status: DC

## 2020-01-29 NOTE — Progress Notes (Signed)
Name: Shane Boyd.   MRN: [id number]    DOB: [birth year]   Date:01/29/2020       Progress Note  Subjective  Chief Complaint  Chief Complaint  Patient presents with  . Diabetes  . Dyslipidemia  . Anxiety  . Depression    I connected with  [name]  on 01/29/20 at 11:20 AM EST by a video enabled telemedicine application and verified that I am speaking with the correct person using two identifiers.  I discussed the limitations of evaluation and management by telemedicine and the availability of in person appointments. The patient expressed understanding and agreed to proceed. Staff also discussed with the patient that there may be a patient responsible charge related to this service. Patient Location: at home  Provider Location: [facility]    HPI   GERD: he switched to Omeprazole at night and H2 blocker in am's and seems to be doing better, he still takes Tums prn during the day, but sleeps better at night   Dyslipidemia : he is taking Lovaza BIDand atorvastatin, he needs to take evening dose with a snack to tolerate diet , no myalgia.Last LDL was 47. Continue medications   DMII with ED: he is taking Metformin 1500 mg daily, he  denies polyphagia, polydipsia or polyuria. He has ED His hgbA1Cwas at goal for a long time, last A1C was 6.5% it went up to  8.6% but last visit was down again to 6.2 % , glucose has been in the 120's again. He has dyslipidemia and diabetic neuropathy, he states Lyrica is working very well for him, he has been taking it BID and sometimes he remembers to take one extra capsule during the day   Dizziness: he has episodes of dizziness that can happen any time of the day, associated with nausea and vomiting, it can last daily. It is described as the room spinning. He denies tinnitus or associated headaches. Seen by Dr. Melrose Nakayama in the past and was given Pamelor but episodes still present, but no headache.He has been symptoms free  since last visit   Major Depressive Disorder:heis still taking Citalopram, very seldom taking alprazolam but is due for a refill, last pill he took was last week.   GAD: he is down to 35 pills every 3 months and we will try going down to 33 on his last visit if medication lasts   Hearing loss: he was advised to have hearing aid, but he decided to by a [place] hear phones.Unchanged  Anemia Iron deficiency: he was seen by Dr. Durwin Reges and he needs to have EGD and colonoscopy, but because he felt SOB when he was cleaning his yard and he needs a cardiac clearance prior to having the procedures. He is taking ferrous sulfate and stools are black, he is getting his COVID-19 vaccine this week and feels comfortable going back after second dose for the procedures    Patient Active Problem List   Diagnosis Date Noted  . Type 2 diabetes mellitus with pressure callus ([place]) 10/17/2018  . Right ureteral stone 08/15/2018  . Bilateral inguinal hernia without obstruction or gangrene 08/13/2018  . Fatty liver 08/13/2018  . Difficulty balancing 05/16/2017  . Special screening for malignant neoplasms, colon   . Benign neoplasm of ascending colon   . Benign neoplasm of transverse colon   . Hearing loss, sensorineural, unilateral 10/21/2016  . Dyslipidemia with low high density lipoprotein (HDL) cholesterol with hypertriglyceridemia due to type 2 diabetes  mellitus ([place]) 09/16/2016  . GAD (generalized anxiety disorder) 09/16/2016  . Genital herpes 06/08/2016  . Dizziness 03/09/2016  . History of nonmelanoma skin cancer 09/30/2015  . Neck pain 07/29/2015  . Anxiety 07/19/2015  . Insomnia, persistent 07/19/2015  . CN (constipation) 07/19/2015  . 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 Dyslipidemia 07/19/2015  . Gastric reflux 07/19/2015  . History of shingles 07/19/2015  . History of basal cell cancer 07/19/2015  . H/O Malignant melanoma 07/19/2015  . Personal  history of malignant neoplasm of head and neck 07/19/2015  . Chronic recurrent major depressive disorder ([place]) 07/19/2015  . Obesity (BMI 30.0-34.9) 07/19/2015  . ED (erectile dysfunction) 07/19/2015    Past Surgical History:  Procedure Laterality Date  . ARM SKIN LESION BIOPSY / EXCISION Right 05/18/2017  . COLONOSCOPY    . COLONOSCOPY WITH PROPOFOL N/A 12/15/2016   Procedure: COLONOSCOPY WITH PROPOFOL;  Surgeon: Lucilla Lame, MD;  Location: [place];  Service: Endoscopy;  Laterality: N/A;  diabetic - oral meds  . NASAL SEPTUM SURGERY    . POLYPECTOMY  12/15/2016   Procedure: POLYPECTOMY;  Surgeon: Lucilla Lame, MD;  Location: [place];  Service: Endoscopy;;  . SENTINEL NODE BIOPSY Right 05/18/2017   [facility]    Family History  Problem Relation Age of Onset  . Diabetes Mother   . Heart disease Mother   . Hyperlipidemia Mother   . CVA Father   . Cancer Father        Colon  . Depression Father     Social History   Tobacco Use  . Smoking status: Never Smoker  . Smokeless tobacco: Never Used  Substance Use Topics  . Alcohol use: Yes    Alcohol/week: 0.0 standard drinks    Comment: Holidays    Current Outpatient Medications:  .  ALPRAZolam (XANAX) 1 MG tablet, Take 0.5-1 tablets (0.5-1 mg total) by mouth at bedtime as needed for anxiety., Disp: 35 tablet, Rfl: 0 .  amphetamine-dextroamphetamine (ADDERALL) 15 MG tablet, Take 1 tablet by mouth 2 (two) times daily., Disp: 60 tablet, Rfl: 0 .  atorvastatin (LIPITOR) 80 MG tablet, Take 1 tablet by mouth once daily, Disp: 90 tablet, Rfl: 0 .  buPROPion (WELLBUTRIN XL) 300 MG 24 hr tablet, Take 1 tablet (300 mg total) by mouth daily., Disp: 90 tablet, Rfl: 1 .  docusate sodium (COLACE) 100 MG capsule, Take 1 capsule (100 mg total) by mouth 2 (two) times daily., Disp: 30 capsule, Rfl: 0 .  famotidine (PEPCID) 20 MG tablet, Take 1 tablet (20 mg total) by mouth at bedtime., Disp: 90 tablet, Rfl: 1 .  ferrous sulfate 325  (65 FE) MG EC tablet, Take 1 tablet (325 mg total) by mouth 2 (two) times daily., Disp: 180 tablet, Rfl: 0 .  fluoruracil (CARAC) 0.5 % cream, Apply to affected areas of scalp, foreheard and temples twice dialy for 4-5 days., Disp: , Rfl: 0 .  ibuprofen (MOTRIN IB) 200 MG tablet, Take 3 tablets (600 mg total) by mouth every 6 (six) hours as needed., Disp: 20 tablet, Rfl: 0 .  metFORMIN (GLUCOPHAGE-XR) 750 MG 24 hr tablet, TAKE 2 TABLETS BY MOUTH ONCE DAILY WITH BREAKFAST, Disp: 180 tablet, Rfl: 0 .  omega-3 acid ethyl esters (LOVAZA) 1 g capsule, Take 2 capsules (2 g total) by mouth 2 (two) times daily., Disp: 480 capsule, Rfl: 1 .  omeprazole (PRILOSEC) 40 MG capsule, Take 1 capsule (40 mg total) by mouth daily., Disp:  90 capsule, Rfl: 1 .  pregabalin (LYRICA) 50 MG capsule, TAKE 1 CAPSULE BY MOUTH THREE TIMES DAILY, Disp: 90 capsule, Rfl: 0 .  tadalafil (CIALIS) 5 MG tablet, Take 1 tablet (5 mg total) by mouth daily as needed for erectile dysfunction., Disp: 90 tablet, Rfl: 1 .  tamsulosin (FLOMAX) 0.4 MG CAPS capsule, Take 1 capsule by mouth once daily, Disp: 90 capsule, Rfl: 0 .  valACYclovir (VALTREX) 500 MG tablet, TAKE ONE CAPLET BY MOUTH THREE TIMES DAILY, Disp: 90 tablet, Rfl: 1  No Known Allergies  I personally reviewed active problem list, medication list, allergies, family history, social history, health maintenance with the patient/caregiver today.   ROS  Ten systems reviewed and is negative except as mentioned in HPI  Objective  Virtual encounter, vitals obtained at home  Today's Vitals   01/29/20 1228  BP: 124/70  Weight: 212 lb (96.2 kg)   Body mass index is 31.31 kg/m.   Physical Exam  Awake, alert and oriented  PHQ2/9: Depression screen [facility] 2/9 01/29/2020 10/25/2019 07/24/2019 04/22/2019 01/22/2019  Decreased Interest 0 0 0 0 0  Down, Depressed, Hopeless 0 0 1 0 0  PHQ - 2 Score 0 0 1 0 0  Altered sleeping 0 0 0 0 0  Tired, decreased energy 0 0 1 1 1   Change  in appetite 0 0 0 0 0  Feeling bad or failure about yourself  0 0 0 0 0  Trouble concentrating 0 0 0 0 0  Moving slowly or fidgety/restless 0 0 0 0 0  Suicidal thoughts 0 0 0 0 0  PHQ-9 Score 0 0 2 1 1   Difficult doing work/chores - - Not difficult at all Not difficult at all Not difficult at all  Some recent data might be hidden   PHQ-2/9 Result is negative.    Fall Risk: Fall Risk  01/29/2020 10/25/2019 07/24/2019 04/22/2019 01/22/2019  Falls in the past year? 0 - 0 0 0  Number falls in past yr: 0 0 0 0 -  Injury with Fall? 0 0 0 0 -  Comment - - - - -     Assessment & Plan  1. Chronic recurrent major depressive disorder (HCC)  - amphetamine-dextroamphetamine (ADDERALL) 15 MG tablet; Take 1 tablet by mouth 2 (two) times daily.  Dispense: 60 tablet; Refill: 0 - buPROPion (WELLBUTRIN XL) 300 MG 24 hr tablet; Take 1 tablet (300 mg total) by mouth daily.  Dispense: 90 tablet; Refill: 1 - amphetamine-dextroamphetamine (ADDERALL) 15 MG tablet; Take 1 tablet by mouth 2 (two) times daily.  Dispense: 60 tablet; Refill: 0 - amphetamine-dextroamphetamine (ADDERALL) 15 MG tablet; Take 1 tablet by mouth 2 (two) times daily.  Dispense: 60 tablet; Refill: 0  2. Gastric reflux  - omeprazole (PRILOSEC) 40 MG capsule; Take 1 capsule (40 mg total) by mouth every evening.  Dispense: 90 capsule; Refill: 1 - famotidine (PEPCID) 20 MG tablet; Take 1 tablet (20 mg total) by mouth daily.  Dispense: 90 tablet; Refill: 1  3. Type 2 diabetes mellitus with diabetic neuropathy, without long-term current use of insulin (HCC)  Doing better on Lyrica  4. Dyslipidemia   5. 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 GAD (generalized anxiety disorder)   7. Iron deficiency anemia, unspecified iron deficiency anemia type  - Ambulatory referral to Gastroenterology  I discussed the assessment and treatment plan with  the patient. The patient was provided  an opportunity to ask questions and all were answered. The patient agreed with the plan and demonstrated an understanding of the instructions.  The patient was advised to call back or seek an in-person evaluation if the symptoms worsen or if the condition fails to improve as anticipated.  I provided 25  minutes of non-face-to-face time during this encounter.

## 2020-01-31 ENCOUNTER — Ambulatory Visit: Payer: Medicare PPO | Attending: Internal Medicine

## 2020-01-31 ENCOUNTER — Other Ambulatory Visit: Payer: Self-pay | Admitting: Family Medicine

## 2020-01-31 DIAGNOSIS — F411 Generalized anxiety disorder: Secondary | ICD-10-CM

## 2020-01-31 DIAGNOSIS — Z23 Encounter for immunization: Secondary | ICD-10-CM

## 2020-01-31 NOTE — Progress Notes (Signed)
   [id number]  Name:  Efrayim Filak.    MRN: [id number] DOB: 05-12-1951  01/31/2020  Mr. Novacek was observed post Covid-19 immunization for 15 minutes without incidence. He was provided with Vaccine Information Sheet and instruction to access the V-Safe system.   Mr. Kuper was instructed to call 911 with any severe reactions post vaccine: Marland Kitchen Difficulty breathing  . Swelling of your face and throat  . A fast heartbeat  . A bad rash all over your body  . Dizziness and weakness    Immunizations Administered    Name Date Dose VIS Date Route   Pfizer COVID-19 Vaccine 01/31/2020 10:34 AM 0.3 mL 11/15/2019 Intramuscular   Manufacturer: [place]   Lot: [id number]   [place]: [id number]

## 2020-02-05 ENCOUNTER — Other Ambulatory Visit: Payer: Self-pay | Admitting: Family Medicine

## 2020-02-05 ENCOUNTER — Encounter: Payer: Self-pay | Admitting: Family Medicine

## 2020-02-05 DIAGNOSIS — N401 Enlarged prostate with lower urinary tract symptoms: Secondary | ICD-10-CM

## 2020-02-05 DIAGNOSIS — E1140 Type 2 diabetes mellitus with diabetic neuropathy, unspecified: Secondary | ICD-10-CM

## 2020-02-05 MED ORDER — TADALAFIL 5 MG PO TABS: 5.0000 mg | ORAL_TABLET | Freq: Every day | ORAL | 1 refills | Status: DC | PRN

## 2020-02-06 ENCOUNTER — Other Ambulatory Visit: Payer: Self-pay

## 2020-02-06 ENCOUNTER — Encounter: Payer: Self-pay | Admitting: Cardiology

## 2020-02-06 ENCOUNTER — Ambulatory Visit (INDEPENDENT_AMBULATORY_CARE_PROVIDER_SITE_OTHER): Payer: Medicare PPO | Admitting: Cardiology

## 2020-02-06 VITALS — BP 120/78 | HR 70 | Ht 69.0 in | Wt 217.5 lb

## 2020-02-06 DIAGNOSIS — E7800 Pure hypercholesterolemia, unspecified: Secondary | ICD-10-CM | POA: Diagnosis not present

## 2020-02-06 DIAGNOSIS — R0609 Other forms of dyspnea: Secondary | ICD-10-CM

## 2020-02-06 DIAGNOSIS — R0600 Dyspnea, unspecified: Secondary | ICD-10-CM

## 2020-02-06 NOTE — Progress Notes (Signed)
Cardiology Office Note:    Date:  02/06/2020   ID:  Shane Acre., DOB [birth year], MRN [id number]  PCP:  Steele Sizer, MD  Cardiologist:  Kate Sable, MD  Electrophysiologist:  None   Referring MD: Steele Sizer, MD   Chief Complaint  Patient presents with  . OTHER    3 month f/u no complaints today.  Meds reviewed verbally with pt.    History of Present Illness:    Shane Debord. is a 69 y.o. male with a hx of hyperlipidemia, diabetes, GERD who presents for follow-up.  He was last seen due to 23-month history of shortness of breath.  Shortness of breath is associated with activity such as carrying leaves and when he tried to shovel.  Due to risk factors, echocardiogram and Lexiscan stress test was ordered. Denies ever smoking.  His father had CAD with stent in his 66s.  Past Medical History:  Diagnosis Date  . Anemia   . Anxiety   . Chronic insomnia   . Constipation   . Depression   . Diabetes mellitus without complication ([place])   . Encounter for long-term (current) use of other high-risk medications   . GERD (gastroesophageal reflux disease)   . History of malignant melanoma    Dr. Koleen Nimrod  . History of squamous cell carcinoma   . Hx of basal cell carcinoma   . Hyperlipidemia   . Leukocytosis   . Obesity   . Other male erectile dysfunction   . Vertigo    1-2x/yr    Past Surgical History:  Procedure Laterality Date  . ARM SKIN LESION BIOPSY / EXCISION Right 05/18/2017  . COLONOSCOPY    . COLONOSCOPY WITH PROPOFOL N/A 12/15/2016   Procedure: COLONOSCOPY WITH PROPOFOL;  Surgeon: Lucilla Lame, MD;  Location: [facility];  Service: Endoscopy;  Laterality: N/A;  diabetic - oral meds  . NASAL SEPTUM SURGERY    . POLYPECTOMY  12/15/2016   Procedure: POLYPECTOMY;  Surgeon: Lucilla Lame, MD;  Location: [facility];  Service: Endoscopy;;  . SENTINEL NODE BIOPSY Right 05/18/2017   [facility]    Current Medications: Current Meds  Medication  Sig  . ALPRAZolam (XANAX) 1 MG tablet TAKE 1/2 TO 1 (ONE-HALF TO ONE) TABLET BY MOUTH AT BEDTIME AS NEEDED FOR ANXIETY  . amphetamine-dextroamphetamine (ADDERALL) 15 MG tablet Take 1 tablet by mouth 2 (two) times daily.  Marland Kitchen atorvastatin (LIPITOR) 80 MG tablet Take 1 tablet by mouth once daily  . buPROPion (WELLBUTRIN XL) 300 MG 24 hr tablet Take 1 tablet (300 mg total) by mouth daily.  Marland Kitchen docusate sodium (COLACE) 100 MG capsule Take 1 capsule (100 mg total) by mouth 2 (two) times daily.  . famotidine (PEPCID) 20 MG tablet Take 1 tablet (20 mg total) by mouth daily.  . ferrous sulfate 325 (65 FE) MG EC tablet Take 1 tablet (325 mg total) by mouth 2 (two) times daily.  . fluoruracil (CARAC) 0.5 % cream Apply to affected areas of scalp, foreheard and temples twice dialy for 4-5 days.  Marland Kitchen ibuprofen (MOTRIN IB) 200 MG tablet Take 3 tablets (600 mg total) by mouth every 6 (six) hours as needed.  . metFORMIN (GLUCOPHAGE-XR) 750 MG 24 hr tablet TAKE 2 TABLETS BY MOUTH ONCE DAILY WITH BREAKFAST  . omega-3 acid ethyl esters (LOVAZA) 1 g capsule Take 2 capsules (2 g total) by mouth 2 (two) times daily.  Marland Kitchen omeprazole (PRILOSEC) 40 MG capsule Take 1 capsule (40 mg total) by  mouth every evening.  . pregabalin (LYRICA) 50 MG capsule TAKE 1 CAPSULE BY MOUTH THREE TIMES DAILY  . tadalafil (CIALIS) 5 MG tablet Take 1 tablet (5 mg total) by mouth daily as needed for erectile dysfunction.  . tamsulosin (FLOMAX) 0.4 MG CAPS capsule Take 1 capsule by mouth once daily  . valACYclovir (VALTREX) 500 MG tablet TAKE ONE CAPLET BY MOUTH THREE TIMES DAILY     Allergies:   Patient has no known allergies.   Social History   Socioeconomic History  . Marital status: Married    Spouse name: Shane Boyd  . Number of children: 1  . Years of education: Not on file  . Highest education level: Master's degree (e.g., MA, MS, MEng, MEd, MSW, MBA)  Occupational History  . Occupation: retired  Tobacco Use  . Smoking status: Never  Smoker  . Smokeless tobacco: Never Used  Substance and Sexual Activity  . Alcohol use: Yes    Alcohol/week: 0.0 standard drinks    Comment: Holidays  . Drug use: No  . Sexual activity: Yes    Partners: Female  Other Topics Concern  . Not on file  Social History Narrative   One son: Shane Boyd lives in [place]   One son Trip , died from possible suicide while living in [place]   Social Determinants of Health   Financial Resource Strain:   . Difficulty of Paying Living Expenses: Not on file  Food Insecurity:   . Worried About Charity fundraiser in the Last Year: Not on file  . Ran Out of Food in the Last Year: Not on file  Transportation Needs:   . Lack of Transportation (Medical): Not on file  . Lack of Transportation (Non-Medical): Not on file  Physical Activity:   . Days of Exercise per Week: Not on file  . Minutes of Exercise per Session: Not on file  Stress:   . Feeling of Stress : Not on file  Social Connections:   . Frequency of Communication with Friends and Family: Not on file  . Frequency of Social Gatherings with Friends and Family: Not on file  . Attends Religious Services: Not on file  . Active Member of Clubs or Organizations: Not on file  . Attends Archivist Meetings: Not on file  . Marital Status: Not on file     Family History: The patient's family history includes CVA in his father; Cancer in his father; Depression in his father; Diabetes in his mother; Heart disease in his mother; Hyperlipidemia in his mother.  ROS:   Please see the history of present illness.     All other systems reviewed and are negative.  EKGs/Labs/Other Studies Reviewed:    The following studies were reviewed today:   EKG:  EKG is  ordered today.  The ekg ordered today demonstrates normal sinus rhythm, normal ECG.  Recent Labs: 09/05/2019: ALT 13; BUN 15; Creat 1.14; Hemoglobin 11.5; Platelets 230; Potassium 4.7; Sodium 139  Recent Lipid Panel    Component Value  Date/Time   CHOL 116 09/05/2019 0000   CHOL 148 11/06/2015 0832   TRIG 160 (H) 09/05/2019 0000   HDL 45 09/05/2019 0000   HDL 39 (L) 11/06/2015 0832   CHOLHDL 2.6 09/05/2019 0000   VLDL 53 (H) 04/12/2017 0855   LDLCALC 47 09/05/2019 0000    Physical Exam:    VS:  BP 120/78 (BP Location: Left Arm, Patient Position: Sitting, Cuff Size: Normal)   Pulse 70   Ht  5\' 9"  (1.753 m)   Wt 217 lb 8 oz (98.7 kg)   SpO2 98%   BMI 32.12 kg/m     Wt Readings from Last 3 Encounters:  02/06/20 217 lb 8 oz (98.7 kg)  01/29/20 212 lb (96.2 kg)  11/08/19 217 lb 4 oz (98.5 kg)     GEN:  Well nourished, well developed in no acute distress HEENT: Normal NECK: No JVD; No carotid bruits LYMPHATICS: No lymphadenopathy CARDIAC: RRR, no murmurs, rubs, gallops RESPIRATORY:  Clear to auscultation without rales, wheezing or rhonchi  ABDOMEN: Soft, non-tender, non-distended MUSCULOSKELETAL:  No edema; No deformity  SKIN: Warm and dry NEUROLOGIC:  Alert and oriented x 3 PSYCHIATRIC:  Normal affect   ASSESSMENT:    1. Dyspnea on exertion   2. Pure hypercholesterolemia    PLAN:    In order of problems listed above:  1. Patient with history of dyspnea on exertion.  Has risk factors of hyperlipidemia, diabetes.  Echocardiogram revealed normal systolic and diastolic function.  LVEF is 60 to 65%.  Lexiscan myocardial perfusion imaging stress test showed no evidence for ischemia, low risk scan.  Patient reassured.  2. Continue Lipitor as currently prescribed.  Follow-up as needed   Medication Adjustments/Labs and Tests Ordered: Current medicines are reviewed at length with the patient today.  Concerns regarding medicines are outlined above.  Orders Placed This Encounter  Procedures  . EKG 12-Lead   No orders of the defined types were placed in this encounter.   Patient Instructions  Medication Instructions:  Your physician recommends that you continue on your current medications as directed.  Please refer to the Current Medication list given to you today.  *If you need a refill on your cardiac medications before your next appointment, please call your pharmacy*   Lab Work: None ordered  If you have labs (blood work) drawn today and your tests are completely normal, you will receive your results only by: Marland Kitchen MyChart Message (if you have MyChart) OR . A paper copy in the mail If you have any lab test that is abnormal or we need to change your treatment, we will call you to review the results.   Testing/Procedures: None ordered    Follow-Up: At [facility], you and your health needs are our priority.  As part of our continuing mission to provide you with exceptional heart care, we have created designated Provider Care Teams.  These Care Teams include your primary Cardiologist (physician) and Advanced Practice Providers (APPs -  Physician Assistants and Nurse Practitioners) who all work together to provide you with the care you need, when you need it.  We recommend signing up for the patient portal called "MyChart".  Sign up information is provided on this After Visit Summary.  MyChart is used to connect with patients for Virtual Visits (Telemedicine).  Patients are able to view lab/test results, encounter notes, upcoming appointments, etc.  Non-urgent messages can be sent to your provider as well.   To learn more about what you can do with MyChart, go to [email or web address].    Your next appointment:   As needed.    Signed, Kate Sable, MD  02/06/2020 12:14 PM    [facility]

## 2020-02-06 NOTE — Patient Instructions (Signed)
Medication Instructions:  °Your physician recommends that you continue on your current medications as directed. Please refer to the Current Medication list given to you today. ° °*If you need a refill on your cardiac medications before your next appointment, please call your pharmacy* ° ° °Lab Work: °None ordered  °If you have labs (blood work) drawn today and your tests are completely normal, you will receive your results only by: °• MyChart Message (if you have MyChart) OR °• A paper copy in the mail °If you have any lab test that is abnormal or we need to change your treatment, we will call you to review the results. ° ° °Testing/Procedures: °None ordered  ° ° °Follow-Up: °At [facility], you and your health needs are our priority.  As part of our continuing mission to provide you with exceptional heart care, we have created designated Provider Care Teams.  These Care Teams include your primary Cardiologist (physician) and Advanced Practice Providers (APPs -  Physician Assistants and Nurse Practitioners) who all work together to provide you with the care you need, when you need it. ° °We recommend signing up for the patient portal called "MyChart".  Sign up information is provided on this After Visit Summary.  MyChart is used to connect with patients for Virtual Visits (Telemedicine).  Patients are able to view lab/test results, encounter notes, upcoming appointments, etc.  Non-urgent messages can be sent to your provider as well.   °To learn more about what you can do with MyChart, go to [email or web address].   ° °Your next appointment:   °As needed °

## 2020-02-12 DIAGNOSIS — D2261 Melanocytic nevi of right upper limb, including shoulder: Secondary | ICD-10-CM | POA: Diagnosis not present

## 2020-02-12 DIAGNOSIS — X32XXXA Exposure to sunlight, initial encounter: Secondary | ICD-10-CM | POA: Diagnosis not present

## 2020-02-12 DIAGNOSIS — Z85820 Personal history of malignant melanoma of skin: Secondary | ICD-10-CM | POA: Diagnosis not present

## 2020-02-12 DIAGNOSIS — D2271 Melanocytic nevi of right lower limb, including hip: Secondary | ICD-10-CM | POA: Diagnosis not present

## 2020-02-12 DIAGNOSIS — Z85828 Personal history of other malignant neoplasm of skin: Secondary | ICD-10-CM | POA: Diagnosis not present

## 2020-02-12 DIAGNOSIS — D2262 Melanocytic nevi of left upper limb, including shoulder: Secondary | ICD-10-CM | POA: Diagnosis not present

## 2020-02-12 DIAGNOSIS — L821 Other seborrheic keratosis: Secondary | ICD-10-CM | POA: Diagnosis not present

## 2020-02-12 DIAGNOSIS — L570 Actinic keratosis: Secondary | ICD-10-CM | POA: Diagnosis not present

## 2020-02-25 ENCOUNTER — Ambulatory Visit: Payer: Medicare PPO | Attending: Internal Medicine

## 2020-02-25 DIAGNOSIS — Z23 Encounter for immunization: Secondary | ICD-10-CM

## 2020-02-25 NOTE — Progress Notes (Signed)
   [id number]  Name:  Shane Boyd.    MRN: [id number] DOB: June 25, 1951  02/25/2020  Shane Boyd was observed post Covid-19 immunization for 15 minutes without incident. He was provided with Vaccine Information Sheet and instruction to access the V-Safe system.   Shane Boyd was instructed to call 911 with any severe reactions post vaccine: Marland Kitchen Difficulty breathing  . Swelling of face and throat  . A fast heartbeat  . A bad rash all over body  . Dizziness and weakness   Immunizations Administered    Name Date Dose VIS Date Route   Pfizer COVID-19 Vaccine 02/25/2020 12:00 PM 0.3 mL 11/15/2019 Intramuscular   Manufacturer: [place]   Lot: [id number]   [place]: [id number]

## 2020-03-01 ENCOUNTER — Other Ambulatory Visit: Payer: Self-pay | Admitting: Family Medicine

## 2020-03-01 DIAGNOSIS — E1140 Type 2 diabetes mellitus with diabetic neuropathy, unspecified: Secondary | ICD-10-CM

## 2020-03-01 NOTE — Telephone Encounter (Signed)
Requested medication (s) are due for refill today: yes  Requested medication (s) are on the active medication list: yes  Last refill:  01/14/20  Future visit scheduled: no  Notes to clinic:  medication not delegated to NT to refill   Requested Prescriptions  Pending Prescriptions Disp Refills   pregabalin (LYRICA) 50 MG capsule [Pharmacy Med Name: Pregabalin 50 MG Oral Capsule] 90 capsule 0    Sig: TAKE 1 CAPSULE BY MOUTH THREE TIMES DAILY      Not Delegated - Neurology:  Anticonvulsants - Controlled Failed - 03/01/2020  2:18 PM      Failed - This refill cannot be delegated      Passed - Valid encounter within last 12 months    Recent Outpatient Visits           1 month ago Type 2 diabetes mellitus with diabetic neuropathy, without long-term current use of insulin [facility])   [facility] [place], Drue Stager, MD   4 months ago Type 2 diabetes mellitus with diabetic neuropathy, without long-term current use of insulin [facility])   [facility] [place], Drue Stager, MD   7 months ago 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 [facility])   [facility] Steele Sizer, MD   10 months ago Dyslipidemia with low high density lipoprotein (HDL) cholesterol with hypertriglyceridemia due to type 2 diabetes mellitus [facility])   [facility] Steele Sizer, MD   1 year ago 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 [facility])   [facility] Steele Sizer, MD       Future Appointments             In 1 week Lucilla Lame, MD [facility]

## 2020-03-02 ENCOUNTER — Encounter: Payer: Self-pay | Admitting: Family Medicine

## 2020-03-09 ENCOUNTER — Other Ambulatory Visit: Payer: Self-pay

## 2020-03-09 ENCOUNTER — Ambulatory Visit (INDEPENDENT_AMBULATORY_CARE_PROVIDER_SITE_OTHER): Payer: Medicare PPO | Admitting: Gastroenterology

## 2020-03-09 ENCOUNTER — Encounter: Payer: Self-pay | Admitting: Gastroenterology

## 2020-03-09 DIAGNOSIS — E611 Iron deficiency: Secondary | ICD-10-CM

## 2020-03-09 MED ORDER — SUPREP BOWEL PREP KIT 17.5-3.13-1.6 GM/177ML PO SOLN: 1.0000 | ORAL | 0 refills | Status: DC

## 2020-03-09 NOTE — H&P (View-Only) (Signed)
Primary Care Physician: Steele Sizer, MD  Primary Gastroenterologist:  Dr. Lucilla Lame  Chief Complaint  Patient presents with  . Anemia  . Gastroesophageal Reflux    HPI: Shane Boyd. is a 69 y.o. male here for follow-up of his iron deficiency anemia.  The patient was seen and recommended to get cardiac clearance due to his fatigue on exertion without significant enough anemia to explain his symptoms but was told that once his cardiac work-up was finished he should come back for an EGD and colonoscopy.  The patient has a history of adenomatous polyps in the past.  He also has been told that if the upper endoscopy and colonoscopy did not show a source of the anemia that we would consider doing a capsule endoscopy in addition to the upper endoscopy and colonoscopy. The patient denies any abdominal pain nausea vomiting fevers chills bloody stools or change in bowel habits.  Past Medical History:  Diagnosis Date  . Anemia   . Anxiety   . Chronic insomnia   . Constipation   . Depression   . Diabetes mellitus without complication ([place])   . Encounter for long-term (current) use of other high-risk medications   . GERD (gastroesophageal reflux disease)   . History of malignant melanoma    Dr. Koleen Nimrod  . History of squamous cell carcinoma   . Hx of basal cell carcinoma   . Hyperlipidemia   . Leukocytosis   . Obesity   . Other male erectile dysfunction   . Vertigo    1-2x/yr    Current Outpatient Medications  Medication Sig Dispense Refill  . ALPRAZolam (XANAX) 1 MG tablet TAKE 1/2 TO 1 (ONE-HALF TO ONE) TABLET BY MOUTH AT BEDTIME AS NEEDED FOR ANXIETY 35 tablet 0  . amphetamine-dextroamphetamine (ADDERALL) 15 MG tablet Take 1 tablet by mouth 2 (two) times daily. 60 tablet 0  . atorvastatin (LIPITOR) 80 MG tablet Take 1 tablet by mouth once daily 90 tablet 0  . buPROPion (WELLBUTRIN XL) 300 MG 24 hr tablet Take 1 tablet (300 mg total) by mouth daily. 90 tablet 1  .  docusate sodium (COLACE) 100 MG capsule Take 1 capsule (100 mg total) by mouth 2 (two) times daily. 30 capsule 0  . famotidine (PEPCID) 20 MG tablet Take 1 tablet (20 mg total) by mouth daily. 90 tablet 1  . ferrous sulfate 325 (65 FE) MG EC tablet Take 1 tablet (325 mg total) by mouth 2 (two) times daily. 180 tablet 0  . fluoruracil (CARAC) 0.5 % cream Apply to affected areas of scalp, foreheard and temples twice dialy for 4-5 days.  0  . ibuprofen (MOTRIN IB) 200 MG tablet Take 3 tablets (600 mg total) by mouth every 6 (six) hours as needed. 20 tablet 0  . metFORMIN (GLUCOPHAGE-XR) 750 MG 24 hr tablet TAKE 2 TABLETS BY MOUTH ONCE DAILY WITH BREAKFAST 180 tablet 0  . omega-3 acid ethyl esters (LOVAZA) 1 g capsule Take 2 capsules (2 g total) by mouth 2 (two) times daily. 480 capsule 1  . omeprazole (PRILOSEC) 40 MG capsule Take 1 capsule (40 mg total) by mouth every evening. 90 capsule 1  . pregabalin (LYRICA) 50 MG capsule TAKE 1 CAPSULE BY MOUTH THREE TIMES DAILY 270 capsule 1  . tadalafil (CIALIS) 5 MG tablet Take 1 tablet (5 mg total) by mouth daily as needed for erectile dysfunction. 90 tablet 1  . tamsulosin (FLOMAX) 0.4 MG CAPS capsule Take 1 capsule by mouth  once daily 90 capsule 0  . valACYclovir (VALTREX) 500 MG tablet TAKE ONE CAPLET BY MOUTH THREE TIMES DAILY 90 tablet 1   No current facility-administered medications for this visit.    Allergies as of 03/09/2020  . (No Known Allergies)    ROS:  General: Negative for anorexia, weight loss, fever, chills, fatigue, weakness. ENT: Negative for hoarseness, difficulty swallowing , nasal congestion. CV: Negative for chest pain, angina, palpitations, dyspnea on exertion, peripheral edema.  Respiratory: Negative for dyspnea at rest, dyspnea on exertion, cough, sputum, wheezing.  GI: See history of present illness. GU:  Negative for dysuria, hematuria, urinary incontinence, urinary frequency, nocturnal urination.  Endo: Negative for  unusual weight change.    Physical Examination:   There were no vitals taken for this visit.  General: Well-nourished, well-developed in no acute distress.  Eyes: No icterus. Conjunctivae pink. Lungs: Clear to auscultation bilaterally. Non-labored. Heart: Regular rate and rhythm, no murmurs rubs or gallops.  Abdomen: Bowel sounds are normal, nontender, nondistended, no hepatosplenomegaly or masses, no abdominal bruits or hernia , no rebound or guarding.   Extremities: No lower extremity edema. No clubbing or deformities. Neuro: Alert and oriented x 3.  Grossly intact. Skin: Warm and dry, no jaundice.   Psych: Alert and cooperative, normal mood and affect.  Labs:    Imaging Studies: No results found.  Assessment and Plan:   Shane Boyd. is a 69 y.o. y/o male who has been found to have iron deficiency anemia and had a cardiac work-up for his fatigue which was negative.  The patient states that he thinks his fatigue is now because of being an active and out of shape.  The patient will not be set up for a work-up for his iron deficiency anemia.  The patient will be set up for an EGD and colonoscopy.  He has also been told that if these are negative he may need to undergo a capsule endoscopy.  The patient has been explained the plan agrees with it.     Lucilla Lame, MD. Marval Regal    Note: This dictation was prepared with Dragon dictation along with smaller phrase technology. Any transcriptional errors that result from this process are unintentional.

## 2020-03-09 NOTE — Progress Notes (Signed)
Primary Care Physician: Steele Sizer, MD  Primary Gastroenterologist:  Dr. Lucilla Lame  Chief Complaint  Patient presents with  . Anemia  . Gastroesophageal Reflux    HPI: Shane Boyd. is a 69 y.o. male here for follow-up of his iron deficiency anemia.  The patient was seen and recommended to get cardiac clearance due to his fatigue on exertion without significant enough anemia to explain his symptoms but was told that once his cardiac work-up was finished he should come back for an EGD and colonoscopy.  The patient has a history of adenomatous polyps in the past.  He also has been told that if the upper endoscopy and colonoscopy did not show a source of the anemia that we would consider doing a capsule endoscopy in addition to the upper endoscopy and colonoscopy. The patient denies any abdominal pain nausea vomiting fevers chills bloody stools or change in bowel habits.  Past Medical History:  Diagnosis Date  . Anemia   . Anxiety   . Chronic insomnia   . Constipation   . Depression   . Diabetes mellitus without complication ([place])   . Encounter for long-term (current) use of other high-risk medications   . GERD (gastroesophageal reflux disease)   . History of malignant melanoma    Dr. Koleen Nimrod  . History of squamous cell carcinoma   . Hx of basal cell carcinoma   . Hyperlipidemia   . Leukocytosis   . Obesity   . Other male erectile dysfunction   . Vertigo    1-2x/yr    Current Outpatient Medications  Medication Sig Dispense Refill  . ALPRAZolam (XANAX) 1 MG tablet TAKE 1/2 TO 1 (ONE-HALF TO ONE) TABLET BY MOUTH AT BEDTIME AS NEEDED FOR ANXIETY 35 tablet 0  . amphetamine-dextroamphetamine (ADDERALL) 15 MG tablet Take 1 tablet by mouth 2 (two) times daily. 60 tablet 0  . atorvastatin (LIPITOR) 80 MG tablet Take 1 tablet by mouth once daily 90 tablet 0  . buPROPion (WELLBUTRIN XL) 300 MG 24 hr tablet Take 1 tablet (300 mg total) by mouth daily. 90 tablet 1  .  docusate sodium (COLACE) 100 MG capsule Take 1 capsule (100 mg total) by mouth 2 (two) times daily. 30 capsule 0  . famotidine (PEPCID) 20 MG tablet Take 1 tablet (20 mg total) by mouth daily. 90 tablet 1  . ferrous sulfate 325 (65 FE) MG EC tablet Take 1 tablet (325 mg total) by mouth 2 (two) times daily. 180 tablet 0  . fluoruracil (CARAC) 0.5 % cream Apply to affected areas of scalp, foreheard and temples twice dialy for 4-5 days.  0  . ibuprofen (MOTRIN IB) 200 MG tablet Take 3 tablets (600 mg total) by mouth every 6 (six) hours as needed. 20 tablet 0  . metFORMIN (GLUCOPHAGE-XR) 750 MG 24 hr tablet TAKE 2 TABLETS BY MOUTH ONCE DAILY WITH BREAKFAST 180 tablet 0  . omega-3 acid ethyl esters (LOVAZA) 1 g capsule Take 2 capsules (2 g total) by mouth 2 (two) times daily. 480 capsule 1  . omeprazole (PRILOSEC) 40 MG capsule Take 1 capsule (40 mg total) by mouth every evening. 90 capsule 1  . pregabalin (LYRICA) 50 MG capsule TAKE 1 CAPSULE BY MOUTH THREE TIMES DAILY 270 capsule 1  . tadalafil (CIALIS) 5 MG tablet Take 1 tablet (5 mg total) by mouth daily as needed for erectile dysfunction. 90 tablet 1  . tamsulosin (FLOMAX) 0.4 MG CAPS capsule Take 1 capsule by mouth  once daily 90 capsule 0  . valACYclovir (VALTREX) 500 MG tablet TAKE ONE CAPLET BY MOUTH THREE TIMES DAILY 90 tablet 1   No current facility-administered medications for this visit.    Allergies as of 03/09/2020  . (No Known Allergies)    ROS:  General: Negative for anorexia, weight loss, fever, chills, fatigue, weakness. ENT: Negative for hoarseness, difficulty swallowing , nasal congestion. CV: Negative for chest pain, angina, palpitations, dyspnea on exertion, peripheral edema.  Respiratory: Negative for dyspnea at rest, dyspnea on exertion, cough, sputum, wheezing.  GI: See history of present illness. GU:  Negative for dysuria, hematuria, urinary incontinence, urinary frequency, nocturnal urination.  Endo: Negative for  unusual weight change.    Physical Examination:   There were no vitals taken for this visit.  General: Well-nourished, well-developed in no acute distress.  Eyes: No icterus. Conjunctivae pink. Lungs: Clear to auscultation bilaterally. Non-labored. Heart: Regular rate and rhythm, no murmurs rubs or gallops.  Abdomen: Bowel sounds are normal, nontender, nondistended, no hepatosplenomegaly or masses, no abdominal bruits or hernia , no rebound or guarding.   Extremities: No lower extremity edema. No clubbing or deformities. Neuro: Alert and oriented x 3.  Grossly intact. Skin: Warm and dry, no jaundice.   Psych: Alert and cooperative, normal mood and affect.  Labs:    Imaging Studies: No results found.  Assessment and Plan:   Shane Boyd. is a 69 y.o. y/o male who has been found to have iron deficiency anemia and had a cardiac work-up for his fatigue which was negative.  The patient states that he thinks his fatigue is now because of being an active and out of shape.  The patient will not be set up for a work-up for his iron deficiency anemia.  The patient will be set up for an EGD and colonoscopy.  He has also been told that if these are negative he may need to undergo a capsule endoscopy.  The patient has been explained the plan agrees with it.     Lucilla Lame, MD. Marval Regal    Note: This dictation was prepared with Dragon dictation along with smaller phrase technology. Any transcriptional errors that result from this process are unintentional.

## 2020-03-27 ENCOUNTER — Other Ambulatory Visit: Payer: Self-pay

## 2020-03-27 ENCOUNTER — Other Ambulatory Visit
Admission: RE | Admit: 2020-03-27 | Discharge: 2020-03-27 | Disposition: A | Discharge status: Home / Self Care | Payer: Medicare PPO | Source: Ambulatory Visit | Attending: Gastroenterology | Admitting: Gastroenterology

## 2020-03-27 DIAGNOSIS — Z01812 Encounter for preprocedural laboratory examination: Secondary | ICD-10-CM | POA: Insufficient documentation

## 2020-03-27 DIAGNOSIS — Z20822 Contact with and (suspected) exposure to covid-19: Secondary | ICD-10-CM | POA: Insufficient documentation

## 2020-03-27 LAB — SARS CORONAVIRUS 2 (TAT 6-24 HRS): SARS Coronavirus 2: NEGATIVE

## 2020-03-30 ENCOUNTER — Encounter: Payer: Self-pay | Admitting: Family Medicine

## 2020-03-31 ENCOUNTER — Encounter
Admission: RE | Disposition: A | Discharge status: Home / Self Care | Payer: Self-pay | Source: Ambulatory Visit | Attending: Gastroenterology

## 2020-03-31 ENCOUNTER — Other Ambulatory Visit: Payer: Self-pay

## 2020-03-31 ENCOUNTER — Encounter: Payer: Self-pay | Admitting: Gastroenterology

## 2020-03-31 ENCOUNTER — Ambulatory Visit
Admission: RE | Admit: 2020-03-31 | Discharge: 2020-03-31 | Disposition: A | Discharge status: Home / Self Care | Payer: Medicare PPO | Source: Ambulatory Visit | Attending: Gastroenterology | Admitting: Gastroenterology

## 2020-03-31 ENCOUNTER — Ambulatory Visit: Payer: Medicare PPO | Admitting: Anesthesiology

## 2020-03-31 DIAGNOSIS — D123 Benign neoplasm of transverse colon: Secondary | ICD-10-CM | POA: Diagnosis not present

## 2020-03-31 DIAGNOSIS — F329 Major depressive disorder, single episode, unspecified: Secondary | ICD-10-CM | POA: Insufficient documentation

## 2020-03-31 DIAGNOSIS — D122 Benign neoplasm of ascending colon: Secondary | ICD-10-CM | POA: Insufficient documentation

## 2020-03-31 DIAGNOSIS — F419 Anxiety disorder, unspecified: Secondary | ICD-10-CM | POA: Insufficient documentation

## 2020-03-31 DIAGNOSIS — Z7984 Long term (current) use of oral hypoglycemic drugs: Secondary | ICD-10-CM | POA: Diagnosis not present

## 2020-03-31 DIAGNOSIS — K5730 Diverticulosis of large intestine without perforation or abscess without bleeding: Secondary | ICD-10-CM | POA: Insufficient documentation

## 2020-03-31 DIAGNOSIS — D509 Iron deficiency anemia, unspecified: Secondary | ICD-10-CM | POA: Insufficient documentation

## 2020-03-31 DIAGNOSIS — Z6831 Body mass index (BMI) 31.0-31.9, adult: Secondary | ICD-10-CM | POA: Diagnosis not present

## 2020-03-31 DIAGNOSIS — D500 Iron deficiency anemia secondary to blood loss (chronic): Secondary | ICD-10-CM | POA: Diagnosis not present

## 2020-03-31 DIAGNOSIS — K640 First degree hemorrhoids: Secondary | ICD-10-CM | POA: Diagnosis not present

## 2020-03-31 DIAGNOSIS — K635 Polyp of colon: Secondary | ICD-10-CM

## 2020-03-31 DIAGNOSIS — E669 Obesity, unspecified: Secondary | ICD-10-CM | POA: Insufficient documentation

## 2020-03-31 DIAGNOSIS — Z85820 Personal history of malignant melanoma of skin: Secondary | ICD-10-CM | POA: Insufficient documentation

## 2020-03-31 DIAGNOSIS — K5790 Diverticulosis of intestine, part unspecified, without perforation or abscess without bleeding: Secondary | ICD-10-CM | POA: Diagnosis not present

## 2020-03-31 DIAGNOSIS — E119 Type 2 diabetes mellitus without complications: Secondary | ICD-10-CM | POA: Diagnosis not present

## 2020-03-31 DIAGNOSIS — E611 Iron deficiency: Secondary | ICD-10-CM

## 2020-03-31 DIAGNOSIS — Z79899 Other long term (current) drug therapy: Secondary | ICD-10-CM | POA: Diagnosis not present

## 2020-03-31 DIAGNOSIS — K219 Gastro-esophageal reflux disease without esophagitis: Secondary | ICD-10-CM | POA: Insufficient documentation

## 2020-03-31 DIAGNOSIS — E785 Hyperlipidemia, unspecified: Secondary | ICD-10-CM | POA: Diagnosis not present

## 2020-03-31 HISTORY — PX: ESOPHAGOGASTRODUODENOSCOPY (EGD) WITH PROPOFOL: SHX5813

## 2020-03-31 HISTORY — PX: COLONOSCOPY WITH PROPOFOL: SHX5780

## 2020-03-31 LAB — GLUCOSE, CAPILLARY: Glucose-Capillary: 100 mg/dL — ABNORMAL HIGH (ref 70–99)

## 2020-03-31 SURGERY — COLONOSCOPY WITH PROPOFOL
Anesthesia: General

## 2020-03-31 MED ORDER — SODIUM CHLORIDE 0.9 % IV SOLN
INTRAVENOUS | Status: DC
  Administered 2020-03-31: 1000 mL via INTRAVENOUS

## 2020-03-31 MED ORDER — MIDAZOLAM HCL 2 MG/2ML IJ SOLN
INTRAMUSCULAR | Status: AC
  Filled 2020-03-31: qty 2

## 2020-03-31 MED ORDER — PROPOFOL 500 MG/50ML IV EMUL
INTRAVENOUS | Status: AC
  Filled 2020-03-31: qty 50

## 2020-03-31 MED ORDER — PROPOFOL 500 MG/50ML IV EMUL
INTRAVENOUS | Status: DC | PRN
  Administered 2020-03-31: 150 ug/kg/min via INTRAVENOUS

## 2020-03-31 MED ORDER — MIDAZOLAM HCL 2 MG/2ML IJ SOLN
INTRAMUSCULAR | Status: DC | PRN
  Administered 2020-03-31: 2 mg via INTRAVENOUS

## 2020-03-31 MED ORDER — LIDOCAINE HCL (CARDIAC) PF 100 MG/5ML IV SOSY
PREFILLED_SYRINGE | INTRAVENOUS | Status: DC | PRN
  Administered 2020-03-31: 50 mg via INTRAVENOUS

## 2020-03-31 MED ORDER — LIDOCAINE HCL (PF) 2 % IJ SOLN
INTRAMUSCULAR | Status: AC
  Filled 2020-03-31: qty 5

## 2020-03-31 NOTE — Op Note (Signed)
[facility] Patient Name: Shane Boyd Procedure Date: 03/31/2020 8:10 AM MRN: [id number] Account #: [id number] Date of Birth: [birth year] Admit Type: Outpatient Age: 69 Room: [facility] ENDO ROOM 4 Gender: Male Note Status: Finalized Procedure:             Colonoscopy Indications:           Iron deficiency anemia Providers:             Lucilla Lame MD, MD Referring MD:          Bethena Roys. Sowles, MD (Referring MD) Medicines:             Propofol per Anesthesia Complications:         No immediate complications. Procedure:             Pre-Anesthesia Assessment:                        - Prior to the procedure, a History and Physical was                         performed, and patient medications and allergies were                         reviewed. The patient's tolerance of previous                         anesthesia was also reviewed. The risks and benefits                         of the procedure and the sedation options and risks                         were discussed with the patient. All questions were                         answered, and informed consent was obtained. Prior                         Anticoagulants: The patient has taken no previous                         anticoagulant or antiplatelet agents. ASA Grade                         Assessment: II - A patient with mild systemic disease.                         After reviewing the risks and benefits, the patient                         was deemed in satisfactory condition to undergo the                         procedure.                        After obtaining informed consent, the colonoscope was  passed under direct vision. Throughout the procedure,                         the patient's blood pressure, pulse, and oxygen                         saturations were monitored continuously. The                         Colonoscope was introduced through the anus and                advanced to the the cecum, identified by appendiceal                         orifice and ileocecal valve. The colonoscopy was                         performed without difficulty. The patient tolerated                         the procedure well. The quality of the bowel                         preparation was good. Findings:      The perianal and digital rectal examinations were normal.      Three sessile polyps were found in the ascending colon. The polyps were       2 to 4 mm in size. These polyps were removed with a cold biopsy forceps.       Resection and retrieval were complete.      A 3 mm polyp was found in the transverse colon. The polyp was sessile.       The polyp was removed with a cold biopsy forceps. Resection and       retrieval were complete.      Multiple small-mouthed diverticula were found in the entire colon.      Non-bleeding internal hemorrhoids were found during retroflexion. The       hemorrhoids were Grade I (internal hemorrhoids that do not prolapse). Impression:            - Three 2 to 4 mm polyps in the ascending colon,                         removed with a cold biopsy forceps. Resected and                         retrieved.                        - One 3 mm polyp in the transverse colon, removed with                         a cold biopsy forceps. Resected and retrieved.                        - Diverticulosis in the entire examined colon.                        - Non-bleeding internal hemorrhoids. Recommendation:        -  Discharge patient to home.                        - Resume previous diet.                        - Continue present medications.                        - Await pathology results.                        - Repeat colonoscopy in 5 years if polyp adenoma and                         10 years if hyperplastic Procedure Code(s):     --- Professional ---                        [phone], Colonoscopy, flexible; with biopsy, single or                          multiple Diagnosis Code(s):     --- Professional ---                        D50.9, Iron deficiency anemia, unspecified                        K63.5, Polyp of colon CPT copyright 2019 American Medical Association. All rights reserved. The codes documented in this report are preliminary and upon coder review may  be revised to meet current compliance requirements. Lucilla Lame MD, MD 03/31/2020 8:37:53 AM This report has been signed electronically. Number of Addenda: 0 Note Initiated On: 03/31/2020 8:10 AM Scope Withdrawal Time: 0 hours 8 minutes 37 seconds  Total Procedure Duration: 0 hours 13 minutes 48 seconds  Estimated Blood Loss:  Estimated blood loss: none.      [facility]

## 2020-03-31 NOTE — Interval H&P Note (Signed)
History and Physical Interval Note:  03/31/2020 7:29 AM  Shane Boyd.  has presented today for surgery, with the diagnosis of Iron deficiency anemia D50.0.  The various methods of treatment have been discussed with the patient and family. After consideration of risks, benefits and other options for treatment, the patient has consented to  Procedure(s): COLONOSCOPY WITH PROPOFOL (N/A) ESOPHAGOGASTRODUODENOSCOPY (EGD) WITH PROPOFOL (N/A) as a surgical intervention.  The patient's history has been reviewed, patient examined, no change in status, stable for surgery.  I have reviewed the patient's chart and labs.  Questions were answered to the patient's satisfaction.     Dervin Vore [employer]

## 2020-03-31 NOTE — Anesthesia Preprocedure Evaluation (Signed)
Anesthesia Evaluation  Patient identified by MRN, date of birth, ID band Patient awake    Reviewed: Allergy & Precautions, NPO status , Patient's Chart, lab work & pertinent test results  History of Anesthesia Complications Negative for: history of anesthetic complications  Airway Mallampati: II  TM Distance: >3 FB Neck ROM: Full    Dental  (+) Poor Dentition   Pulmonary neg pulmonary ROS, neg sleep apnea, neg COPD,    breath sounds clear to auscultation- rhonchi (-) wheezing      Cardiovascular Exercise Tolerance: Good (-) hypertension(-) CAD, (-) Past MI, (-) Cardiac Stents and (-) CABG  Rhythm:Regular Rate:Normal - Systolic murmurs and - Diastolic murmurs    Neuro/Psych neg Seizures PSYCHIATRIC DISORDERS Anxiety Depression negative neurological ROS     GI/Hepatic Neg liver ROS, GERD  ,  Endo/Other  diabetes, Oral Hypoglycemic Agents  Renal/GU negative Renal ROS     Musculoskeletal negative musculoskeletal ROS (+)   Abdominal (+) + obese,   Peds  Hematology  (+) anemia ,   Anesthesia Other Findings Past Medical History: No date: Anemia No date: Anxiety No date: Chronic insomnia No date: Constipation No date: Depression No date: Diabetes mellitus without complication (HCC) No date: Encounter for long-term (current) use of other high-risk  medications No date: GERD (gastroesophageal reflux disease) No date: History of malignant melanoma     Comment:  Dr. Koleen Nimrod No date: History of squamous cell carcinoma No date: Hx of basal cell carcinoma No date: Hyperlipidemia No date: Leukocytosis No date: Obesity No date: Other male erectile dysfunction No date: Vertigo     Comment:  1-2x/yr   Reproductive/Obstetrics                             Anesthesia Physical Anesthesia Plan  ASA: II  Anesthesia Plan: General   Post-op Pain Management:    Induction: Intravenous  PONV  Risk Score and Plan: 1 and Propofol infusion  Airway Management Planned: Natural Airway  Additional Equipment:   Intra-op Plan:   Post-operative Plan:   Informed Consent: I have reviewed the patients History and Physical, chart, labs and discussed the procedure including the risks, benefits and alternatives for the proposed anesthesia with the patient or authorized representative who has indicated his/her understanding and acceptance.     Dental advisory given  Plan Discussed with: CRNA and Anesthesiologist  Anesthesia Plan Comments:         Anesthesia Quick Evaluation

## 2020-03-31 NOTE — Op Note (Signed)
[facility] Patient Name: Shane Boyd Procedure Date: 03/31/2020 8:11 AM MRN: [id number] Account #: [id number] Date of Birth: Apr 07, 1951 Admit Type: Outpatient Age: 69 Room: [facility] ENDO ROOM 4 Gender: Male Note Status: Finalized Procedure:             Upper GI endoscopy Indications:           Iron deficiency anemia Providers:             Lucilla Lame MD, MD Referring MD:          Bethena Roys. Sowles, MD (Referring MD) Medicines:             Propofol per Anesthesia Complications:         No immediate complications. Procedure:             Pre-Anesthesia Assessment:                        - Prior to the procedure, a History and Physical was                         performed, and patient medications and allergies were                         reviewed. The patient's tolerance of previous                         anesthesia was also reviewed. The risks and benefits                         of the procedure and the sedation options and risks                         were discussed with the patient. All questions were                         answered, and informed consent was obtained. Prior                         Anticoagulants: The patient has taken no previous                         anticoagulant or antiplatelet agents. ASA Grade                         Assessment: II - A patient with mild systemic disease.                         After reviewing the risks and benefits, the patient                         was deemed in satisfactory condition to undergo the                         procedure.                        After obtaining informed consent, the endoscope was  passed under direct vision. Throughout the procedure,                         the patient's blood pressure, pulse, and oxygen                         saturations were monitored continuously. The Endoscope                         was introduced through the mouth, and  advanced to the                         second part of duodenum. The upper GI endoscopy was                         accomplished without difficulty. The patient tolerated                         the procedure well. Findings:      The examined esophagus was normal.      The entire examined stomach was normal.      The examined duodenum was normal. Biopsies for histology were taken with       a cold forceps for evaluation of celiac disease. Impression:            - Normal esophagus.                        - Normal stomach.                        - Normal examined duodenum. Biopsied. Recommendation:        - Discharge patient to home.                        - Resume previous diet.                        - Continue present medications.                        - Perform a colonoscopy today. Procedure Code(s):     --- Professional ---                        [phone], Esophagogastroduodenoscopy, flexible,                         transoral; with biopsy, single or multiple Diagnosis Code(s):     --- Professional ---                        D50.9, Iron deficiency anemia, unspecified CPT copyright 2019 American Medical Association. All rights reserved. The codes documented in this report are preliminary and upon coder review may  be revised to meet current compliance requirements. Lucilla Lame MD, MD 03/31/2020 8:19:45 AM This report has been signed electronically. Number of Addenda: 0 Note Initiated On: 03/31/2020 8:11 AM Estimated Blood Loss:  Estimated blood loss: none.      [facility]

## 2020-03-31 NOTE — Transfer of Care (Signed)
Immediate Anesthesia Transfer of Care Note  Patient: Shane Boyd.  Procedure(s) Performed: COLONOSCOPY WITH PROPOFOL (N/A ) ESOPHAGOGASTRODUODENOSCOPY (EGD) WITH PROPOFOL (N/A )  Patient Location: PACU  Anesthesia Type:General  Level of Consciousness: awake and sedated  Airway & Oxygen Therapy: Patient Spontanous Breathing and Patient connected to nasal cannula oxygen  Post-op Assessment: Report given to RN and Post -op Vital signs reviewed and stable  Post vital signs: Reviewed and stable  Last Vitals:  Vitals Value Taken Time  BP    Temp    Pulse    Resp    SpO2      Last Pain:  Vitals:   03/31/20 0742  TempSrc: Temporal  PainSc: 0-No pain         Complications: No apparent anesthesia complications

## 2020-03-31 NOTE — Anesthesia Postprocedure Evaluation (Signed)
Anesthesia Post Note  Patient: Shane Boyd.  Procedure(s) Performed: COLONOSCOPY WITH PROPOFOL (N/A ) ESOPHAGOGASTRODUODENOSCOPY (EGD) WITH PROPOFOL (N/A )  Patient location during evaluation: Endoscopy Anesthesia Type: General Level of consciousness: awake and alert and oriented Pain management: pain level controlled Vital Signs Assessment: post-procedure vital signs reviewed and stable Respiratory status: spontaneous breathing, nonlabored ventilation and respiratory function stable Cardiovascular status: blood pressure returned to baseline and stable Postop Assessment: no signs of nausea or vomiting Anesthetic complications: no     Last Vitals:  Vitals:   03/31/20 0858 03/31/20 0908  BP: 139/85 (!) 149/74  Pulse: 63   Resp: 11   Temp:    SpO2: 100%     Last Pain:  Vitals:   03/31/20 0908  TempSrc:   PainSc: 0-No pain                 Freddie Dymek

## 2020-04-01 ENCOUNTER — Encounter: Payer: Self-pay | Admitting: *Deleted

## 2020-04-01 LAB — SURGICAL PATHOLOGY

## 2020-04-07 ENCOUNTER — Other Ambulatory Visit: Payer: Self-pay | Admitting: Family Medicine

## 2020-04-07 DIAGNOSIS — E1140 Type 2 diabetes mellitus with diabetic neuropathy, unspecified: Secondary | ICD-10-CM

## 2020-04-21 ENCOUNTER — Encounter: Payer: Self-pay | Admitting: Family Medicine

## 2020-04-21 ENCOUNTER — Telehealth: Payer: Self-pay

## 2020-04-21 NOTE — Telephone Encounter (Signed)
-----   Message from Shane Lame, MD sent at 04/07/2020  1:11 PM EDT ----- Let the patient know that his polyps were precancerous and he needs a repeat colonoscopy in 5 years.  The upper endoscopy did not show any cause for his anemia.  The patient should be set up for a capsule endoscopy.

## 2020-04-21 NOTE — Telephone Encounter (Signed)
Pt notified of colonoscopy. Pt stated he would like to think about the capsule study and discuss this with his PCP before making a decision. Pt stated he will call me back if he wishes to move forward.

## 2020-04-28 ENCOUNTER — Other Ambulatory Visit: Payer: Self-pay | Admitting: Family Medicine

## 2020-04-28 DIAGNOSIS — N401 Enlarged prostate with lower urinary tract symptoms: Secondary | ICD-10-CM

## 2020-04-28 DIAGNOSIS — E785 Hyperlipidemia, unspecified: Secondary | ICD-10-CM

## 2020-04-28 NOTE — Telephone Encounter (Signed)
Requested Prescriptions  Pending Prescriptions Disp Refills  . tamsulosin (FLOMAX) 0.4 MG CAPS capsule [Pharmacy Med Name: Tamsulosin HCl 0.4 MG Oral Capsule] 90 capsule 0    Sig: Take 1 capsule by mouth once daily     Urology: Alpha-Adrenergic Blocker Failed - 04/28/2020 10:13 AM      Failed - Last BP in normal range    BP Readings from Last 1 Encounters:  03/31/20 (!) 149/74         Passed - Valid encounter within last 12 months    Recent Outpatient Visits          3 months ago Type 2 diabetes mellitus with diabetic neuropathy, without long-term current use of insulin ([place])   [facility] [place], Drue Stager, MD   6 months ago Type 2 diabetes mellitus with diabetic neuropathy, without long-term current use of insulin [facility])   [facility] [place], Drue Stager, MD   9 months ago 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 [facility])   [facility] Steele Sizer, MD   1 year ago Dyslipidemia 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 atorvastatin (LIPITOR) 80 MG tablet [Pharmacy Med Name: Atorvastatin Calcium 80 MG Oral Tablet] 90 tablet 0    Sig: Take 1 tablet by mouth once daily     Cardiovascular:  Antilipid - Statins Failed - 04/28/2020 10:13 AM      Failed - Triglycerides in normal range and within 360 days    Triglycerides  Date Value Ref Range Status  09/05/2019 160 (H) <150 mg/dL Final         Passed - Total Cholesterol in normal range and within 360 days    Cholesterol, Total  Date Value Ref Range Status  11/06/2015 148 100 - 199 mg/dL Final   Cholesterol  Date Value Ref Range Status  09/05/2019 116 <200 mg/dL Final        Passed - LDL in normal range and within 360 days    LDL Cholesterol (Calc)  Date Value Ref Range Status  09/05/2019 47 mg/dL (calc) Final    Comment:    Reference range: <100 . Desirable range <100 mg/dL for primary prevention;   <70 mg/dL for patients with CHD or diabetic patients  with > or = 2 CHD risk factors. Marland Kitchen LDL-C is now calculated using the Martin-Hopkins  calculation, which is a validated novel method providing  better accuracy than the Friedewald equation in the  estimation of LDL-C.  Cresenciano Genre et al. Annamaria Helling. [id number]): [id number]  (http://education.[employer].com/faq/FAQ164)          Passed - HDL in normal range and within 360 days    HDL  Date Value Ref Range Status  09/05/2019 45 > OR = 40 mg/dL Final  11/06/2015 39 (L) >39 mg/dL Final         Passed - Patient is not pregnant      Passed - Valid encounter within last 12 months    Recent Outpatient Visits          3 months ago Type 2 diabetes mellitus with diabetic neuropathy, without long-term current use of insulin [facility])   [facility] [place], [place],  MD   6 months ago Type 2 diabetes mellitus with diabetic neuropathy, without long-term current use of insulin [facility])   [facility] [place], Drue Stager, MD   9 months ago 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 [facility])   [facility] Steele Sizer, MD   1 year ago Dyslipidemia with low high density lipoprotein (HDL) cholesterol with hypertriglyceridemia due to type 2 diabetes mellitus [facility])   [facility] Steele Sizer, MD   1 year ago 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 [facility])   [facility] Steele Sizer, MD

## 2020-05-07 ENCOUNTER — Encounter: Payer: Self-pay | Admitting: Family Medicine

## 2020-05-07 ENCOUNTER — Other Ambulatory Visit: Payer: Self-pay | Admitting: Family Medicine

## 2020-05-07 DIAGNOSIS — F339 Major depressive disorder, recurrent, unspecified: Secondary | ICD-10-CM

## 2020-05-07 MED ORDER — AMPHETAMINE-DEXTROAMPHETAMINE 15 MG PO TABS: 15.0000 mg | ORAL_TABLET | Freq: Two times a day (BID) | ORAL | 0 refills | Status: DC

## 2020-05-20 ENCOUNTER — Other Ambulatory Visit: Payer: Self-pay

## 2020-05-20 DIAGNOSIS — E1140 Type 2 diabetes mellitus with diabetic neuropathy, unspecified: Secondary | ICD-10-CM

## 2020-05-20 MED ORDER — METFORMIN HCL ER 750 MG PO TB24: ORAL_TABLET | ORAL | 1 refills | Status: DC

## 2020-06-01 ENCOUNTER — Encounter: Payer: Self-pay | Admitting: Family Medicine

## 2020-06-01 DIAGNOSIS — N521 Erectile dysfunction due to diseases classified elsewhere: Secondary | ICD-10-CM

## 2020-06-01 DIAGNOSIS — N401 Enlarged prostate with lower urinary tract symptoms: Secondary | ICD-10-CM

## 2020-06-01 MED ORDER — TADALAFIL 5 MG PO TABS: 5.0000 mg | ORAL_TABLET | Freq: Every day | ORAL | 1 refills | Status: DC | PRN

## 2020-06-02 ENCOUNTER — Other Ambulatory Visit: Payer: Self-pay

## 2020-06-02 ENCOUNTER — Ambulatory Visit (INDEPENDENT_AMBULATORY_CARE_PROVIDER_SITE_OTHER): Payer: Medicare PPO

## 2020-06-02 VITALS — BP 112/68 | HR 84 | Temp 97.3°F | Resp 16 | Ht 69.0 in | Wt 213.6 lb

## 2020-06-02 DIAGNOSIS — Z0000 Encounter for general adult medical examination without abnormal findings: Secondary | ICD-10-CM

## 2020-06-02 NOTE — Patient Instructions (Signed)
Shane Boyd , Thank you for taking time to come for your Medicare Wellness Visit. I appreciate your ongoing commitment to your health goals. Please review the following plan we discussed and let me know if I can assist you in the future.   Screening recommendations/referrals: Colonoscopy: done 03/31/20. Repeat in 2026 Recommended yearly ophthalmology/optometry visit for glaucoma screening and checkup Recommended yearly dental visit for hygiene and checkup  Vaccinations: Influenza vaccine: done 09/05/19 Pneumococcal vaccine: done 07/04/17 Tdap vaccine: done 10/04/17 Shingles vaccine: Shingrix discussed. Please contact your pharmacy for coverage information.  Covid-19: done 01/31/20 & 02/25/20  Conditions/risks identified: Keep up the great work!  Next appointment: Follow up in one year for your annual wellness visit.   Preventive Care 71 Years and Older, Male Preventive care refers to lifestyle choices and visits with your health care provider that can promote health and wellness. What does preventive care include?  A yearly physical exam. This is also called an annual well check.  Dental exams once or twice a year.  Routine eye exams. Ask your health care provider how often you should have your eyes checked.  Personal lifestyle choices, including:  Daily care of your teeth and gums.  Regular physical activity.  Eating a healthy diet.  Avoiding tobacco and drug use.  Limiting alcohol use.  Practicing safe sex.  Taking low doses of aspirin every day.  Taking vitamin and mineral supplements as recommended by your health care provider. What happens during an annual well check? The services and screenings done by your health care provider during your annual well check will depend on your age, overall health, lifestyle risk factors, and family history of disease. Counseling  Your health care provider may ask you questions about your:  Alcohol use.  Tobacco use.  Drug  use.  Emotional well-being.  Home and relationship well-being.  Sexual activity.  Eating habits.  History of falls.  Memory and ability to understand (cognition).  Work and work Statistician. Screening  You may have the following tests or measurements:  Height, weight, and BMI.  Blood pressure.  Lipid and cholesterol levels. These may be checked every 5 years, or more frequently if you are over 71 years old.  Skin check.  Lung cancer screening. You may have this screening every year starting at age 71 if you have a 30-pack-year history of smoking and currently smoke or have quit within the past 15 years.  Fecal occult blood test (FOBT) of the stool. You may have this test every year starting at age 71.  Flexible sigmoidoscopy or colonoscopy. You may have a sigmoidoscopy every 5 years or a colonoscopy every 10 years starting at age 71.  Prostate cancer screening. Recommendations will vary depending on your family history and other risks.  Hepatitis C blood test.  Hepatitis B blood test.  Sexually transmitted disease (STD) testing.  Diabetes screening. This is done by checking your blood sugar (glucose) after you have not eaten for a while (fasting). You may have this done every 1-3 years.  Abdominal aortic aneurysm (AAA) screening. You may need this if you are a current or former smoker.  Osteoporosis. You may be screened starting at age 71 if you are at high risk. Talk with your health care provider about your test results, treatment options, and if necessary, the need for more tests. Vaccines  Your health care provider may recommend certain vaccines, such as:  Influenza vaccine. This is recommended every year.  Tetanus, diphtheria, and acellular pertussis (Tdap,  Td) vaccine. You may need a Td booster every 10 years.  Zoster vaccine. You may need this after age 71.  Pneumococcal 13-valent conjugate (PCV13) vaccine. One dose is recommended after age  71.  Pneumococcal polysaccharide (PPSV23) vaccine. One dose is recommended after age 71. Talk to your health care provider about which screenings and vaccines you need and how often you need them. This information is not intended to replace advice given to you by your health care provider. Make sure you discuss any questions you have with your health care provider. Document Released: 12/18/2015 Document Revised: 08/10/2016 Document Reviewed: 09/22/2015 Elsevier Interactive Patient Education  2017 [place] Prevention in the Home Falls can cause injuries. They can happen to people of all ages. There are many things you can do to make your home safe and to help prevent falls. What can I do on the outside of my home?  Regularly fix the edges of walkways and driveways and fix any cracks.  Remove anything that might make you trip as you walk through a door, such as a raised step or threshold.  Trim any bushes or trees on the path to your home.  Use bright outdoor lighting.  Clear any walking paths of anything that might make someone trip, such as rocks or tools.  Regularly check to see if handrails are loose or broken. Make sure that both sides of any steps have handrails.  Any raised decks and porches should have guardrails on the edges.  Have any leaves, snow, or ice cleared regularly.  Use sand or salt on walking paths during winter.  Clean up any spills in your garage right away. This includes oil or grease spills. What can I do in the bathroom?  Use night lights.  Install grab bars by the toilet and in the tub and shower. Do not use towel bars as grab bars.  Use non-skid mats or decals in the tub or shower.  If you need to sit down in the shower, use a plastic, non-slip stool.  Keep the floor dry. Clean up any water that spills on the floor as soon as it happens.  Remove soap buildup in the tub or shower regularly.  Attach bath mats securely with double-sided  non-slip rug tape.  Do not have throw rugs and other things on the floor that can make you trip. What can I do in the bedroom?  Use night lights.  Make sure that you have a light by your bed that is easy to reach.  Do not use any sheets or blankets that are too big for your bed. They should not hang down onto the floor.  Have a firm chair that has side arms. You can use this for support while you get dressed.  Do not have throw rugs and other things on the floor that can make you trip. What can I do in the kitchen?  Clean up any spills right away.  Avoid walking on wet floors.  Keep items that you use a lot in easy-to-reach places.  If you need to reach something above you, use a strong step stool that has a grab bar.  Keep electrical cords out of the way.  Do not use floor polish or wax that makes floors slippery. If you must use wax, use non-skid floor wax.  Do not have throw rugs and other things on the floor that can make you trip. What can I do with my stairs?  Do not  leave any items on the stairs.  Make sure that there are handrails on both sides of the stairs and use them. Fix handrails that are broken or loose. Make sure that handrails are as long as the stairways.  Check any carpeting to make sure that it is firmly attached to the stairs. Fix any carpet that is loose or worn.  Avoid having throw rugs at the top or bottom of the stairs. If you do have throw rugs, attach them to the floor with carpet tape.  Make sure that you have a light switch at the top of the stairs and the bottom of the stairs. If you do not have them, ask someone to add them for you. What else can I do to help prevent falls?  Wear shoes that:  Do not have high heels.  Have rubber bottoms.  Are comfortable and fit you well.  Are closed at the toe. Do not wear sandals.  If you use a stepladder:  Make sure that it is fully opened. Do not climb a closed stepladder.  Make sure that both  sides of the stepladder are locked into place.  Ask someone to hold it for you, if possible.  Clearly mark and make sure that you can see:  Any grab bars or handrails.  First and last steps.  Where the edge of each step is.  Use tools that help you move around (mobility aids) if they are needed. These include:  Canes.  Walkers.  Scooters.  Crutches.  Turn on the lights when you go into a dark area. Replace any light bulbs as soon as they burn out.  Set up your furniture so you have a clear path. Avoid moving your furniture around.  If any of your floors are uneven, fix them.  If there are any pets around you, be aware of where they are.  Review your medicines with your doctor. Some medicines can make you feel dizzy. This can increase your chance of falling. Ask your doctor what other things that you can do to help prevent falls. This information is not intended to replace advice given to you by your health care provider. Make sure you discuss any questions you have with your health care provider. Document Released: 09/17/2009 Document Revised: 04/28/2016 Document Reviewed: 12/26/2014 Elsevier Interactive Patient Education  2017 [employer].

## 2020-06-02 NOTE — Progress Notes (Addendum)
Subjective:   Shane Boyd. is a 69 y.o. male who presents for an Initial Medicare Annual Wellness Visit.  Review of Systems     Cardiac Risk Factors include: advanced age (>29mn, >>81women);diabetes mellitus;dyslipidemia;male gender;hypertension;obesity (BMI >30kg/m2)     Objective:    Today's Vitals   06/02/20 0813  BP: 112/68  Pulse: 84  Resp: 16  Temp: (!) 97.3 F (36.3 C)  TempSrc: Temporal  SpO2: 97%  Weight: 213 lb 9.6 oz (96.9 kg)  Height: '5\' 9"'  (1.753 m)   Body mass index is 31.54 kg/m.  Advanced Directives 06/02/2020 03/31/2020 08/12/2018 07/04/2017 03/21/2017 02/20/2017 12/19/2016  Does Patient Have a Medical Advance Directive? Yes Yes Yes Yes - Yes Yes  Type of Advance Directive [place] will Living will Living will [place] will [place] will [place] will Living will;Healthcare Power of Attorney  Does patient want to make changes to medical advance directive? - - No - Patient declined - - - -  Copy of HHunterin Chart? Yes - validated most recent copy scanned in chart (See row information) - - Yes Yes - Yes    Current Medications (verified) Outpatient Encounter Medications as of 06/02/2020  Medication Sig  . ALPRAZolam (XANAX) 1 MG tablet TAKE 1/2 TO 1 (ONE-HALF TO ONE) TABLET BY MOUTH AT BEDTIME AS NEEDED FOR ANXIETY  . amphetamine-dextroamphetamine (ADDERALL) 15 MG tablet Take 1 tablet by mouth 2 (two) times daily.  .Marland Kitchenatorvastatin (LIPITOR) 80 MG tablet Take 1 tablet by mouth once daily  . buPROPion (WELLBUTRIN XL) 300 MG 24 hr tablet Take 1 tablet (300 mg total) by mouth daily.  . cholecalciferol (VITAMIN D3) 25 MCG (1000 UNIT) tablet Take 1,000 Units by mouth daily.  .Marland Kitchendocusate sodium (COLACE) 100 MG capsule Take 1 capsule (100 mg total) by mouth 2 (two) times daily.  . famotidine (PEPCID) 20 MG tablet Take 1 tablet (20 mg  total) by mouth daily.  . ferrous sulfate 325 (65 FE) MG EC tablet Take 1 tablet (325 mg total) by mouth 2 (two) times daily.  . fluoruracil (CARAC) 0.5 % cream Apply to affected areas of scalp, foreheard and temples twice dialy for 4-5 days.  .Marland Kitchenibuprofen (MOTRIN IB) 200 MG tablet Take 3 tablets (600 mg total) by mouth every 6 (six) hours as needed.  . metFORMIN (GLUCOPHAGE-XR) 750 MG 24 hr tablet TAKE 2 TABLETS BY MOUTH ONCE DAILY WITH BREAKFAST  . omega-3 acid ethyl esters (LOVAZA) 1 g capsule Take 2 capsules (2 g total) by mouth 2 (two) times daily.  .Marland Kitchenomeprazole (PRILOSEC) 40 MG capsule Take 1 capsule (40 mg total) by mouth every evening.  . pregabalin (LYRICA) 50 MG capsule TAKE 1 CAPSULE BY MOUTH THREE TIMES DAILY  . tadalafil (CIALIS) 5 MG tablet Take 1 tablet (5 mg total) by mouth daily as needed for erectile dysfunction.  . tamsulosin (FLOMAX) 0.4 MG CAPS capsule Take 1 capsule by mouth once daily  . valACYclovir (VALTREX) 500 MG tablet TAKE ONE CAPLET BY MOUTH THREE TIMES DAILY  . [DISCONTINUED] Na Sulfate-K Sulfate-Mg Sulf (SUPREP BOWEL PREP KIT) 17.5-3.13-1.6 GM/177ML SOLN Take 1 kit by mouth as directed.   No facility-administered encounter medications on file as of 06/02/2020.    Allergies (verified) Patient has no known allergies.   History: Past Medical History:  Diagnosis Date  . Anemia   . Anxiety   . Chronic insomnia   .  Constipation   . Depression   . Diabetes mellitus without complication ([place])   . Encounter for long-term (current) use of other high-risk medications   . GERD (gastroesophageal reflux disease)   . History of malignant melanoma    Dr. Koleen Nimrod  . History of squamous cell carcinoma   . Hx of basal cell carcinoma   . Hyperlipidemia   . Leukocytosis   . Obesity   . Other male erectile dysfunction   . Vertigo    1-2x/yr   Past Surgical History:  Procedure Laterality Date  . ARM SKIN LESION BIOPSY / EXCISION Right 05/18/2017  . COLONOSCOPY      . COLONOSCOPY WITH PROPOFOL N/A 12/15/2016   Procedure: COLONOSCOPY WITH PROPOFOL;  Surgeon: Lucilla Lame, MD;  Location: [facility];  Service: Endoscopy;  Laterality: N/A;  diabetic - oral meds  . COLONOSCOPY WITH PROPOFOL N/A 03/31/2020   Procedure: COLONOSCOPY WITH PROPOFOL;  Surgeon: Lucilla Lame, MD;  Location: [facility] ENDOSCOPY;  Service: Endoscopy;  Laterality: N/A;  . ESOPHAGOGASTRODUODENOSCOPY (EGD) WITH PROPOFOL N/A 03/31/2020   Procedure: ESOPHAGOGASTRODUODENOSCOPY (EGD) WITH PROPOFOL;  Surgeon: Lucilla Lame, MD;  Location: [facility] ENDOSCOPY;  Service: Endoscopy;  Laterality: N/A;  . NASAL SEPTUM SURGERY    . POLYPECTOMY  12/15/2016   Procedure: POLYPECTOMY;  Surgeon: Lucilla Lame, MD;  Location: [facility];  Service: Endoscopy;;  . SENTINEL NODE BIOPSY Right 05/18/2017   [facility]   Family History  Problem Relation Age of Onset  . Diabetes Mother   . Heart disease Mother   . Hyperlipidemia Mother   . CVA Father   . Cancer Father        Colon  . Depression Father    Social History   Socioeconomic History  . Marital status: Married    Spouse name: Neoma Laming  . Number of children: 1  . Years of education: Not on file  . Highest education level: Master's degree (e.g., MA, MS, MEng, MEd, MSW, MBA)  Occupational History  . Occupation: retired  Tobacco Use  . Smoking status: Never Smoker  . Smokeless tobacco: Never Used  Vaping Use  . Vaping Use: Never used  Substance and Sexual Activity  . Alcohol use: Yes    Alcohol/week: 0.0 standard drinks    Comment: Holidays  . Drug use: No  . Sexual activity: Yes    Partners: Female  Other Topics Concern  . Not on file  Social History Narrative   One son: [name] lives in [place]   One son Trip , died from possible suicide while living in [place]   Enjoys music and movies   Social Determinants of Health   Financial Resource Strain: Low Risk   . Difficulty of Paying Living Expenses: Not hard at all  Food Insecurity: No  Food Insecurity  . Worried About Charity fundraiser in the Last Year: Never true  . Ran Out of Food in the Last Year: Never true  Transportation Needs: No Transportation Needs  . Lack of Transportation (Medical): No  . Lack of Transportation (Non-Medical): No  Physical Activity: Inactive  . Days of Exercise per Week: 0 days  . Minutes of Exercise per Session: 0 min  Stress: No Stress Concern Present  . Feeling of Stress : Not at all  Social Connections: Moderately Isolated  . Frequency of Communication with Friends and Family: More than three times a week  . Frequency of Social Gatherings with Friends and Family: Three times a week  . Attends Religious  Services: Never  . Active Member of Clubs or Organizations: No  . Attends Archivist Meetings: Never  . Marital Status: Married    Tobacco Counseling Counseling given: Not Answered   Clinical Intake:  Pre-visit preparation completed: Yes  Pain : No/denies pain     BMI - recorded: 31.54 Nutritional Status: BMI > 30  Obese Nutritional Risks: None Diabetes: Yes CBG done?: No Did pt. bring in CBG monitor from home?: No  How often do you need to have someone help you when you read instructions, pamphlets, or other written materials from your doctor or pharmacy?: 1 - Never  Nutrition Risk Assessment:  Has the patient had any N/V/D within the last 2 months?  No  Does the patient have any non-healing wounds?  No  Has the patient had any unintentional weight loss or weight gain?  No   Diabetes:  Is the patient diabetic?  Yes  If diabetic, was a CBG obtained today?  No  Did the patient bring in their glucometer from home?  No  How often do you monitor your CBG's? Every so often per patient.   Financial Strains and Diabetes Management:  Are you having any financial strains with the device, your supplies or your medication? No .  Does the patient want to be seen by Chronic Care Management for management of their  diabetes?  No  Would the patient like to be referred to a Nutritionist or for Diabetic Management?  No   Diabetic Exams:  Diabetic Eye Exam: Completed 01/27/20 negative retinopathy.   Diabetic Foot Exam: Completed 10/25/19.   Interpreter Needed?: No  Information entered by :: Clemetine Marker LPN   Activities of Daily Living In your present state of health, do you have any difficulty performing the following activities: 06/02/2020 01/29/2020  Hearing? Y N  Comment wears [place]? N N  Difficulty concentrating or making decisions? N N  Walking or climbing stairs? N N  Dressing or bathing? N N  Doing errands, shopping? N N  Preparing Food and eating ? N -  Using the Toilet? N -  In the past six months, have you accidently leaked urine? N -  Do you have problems with loss of bowel control? N -  Managing your Medications? N -  Managing your Finances? N -  Housekeeping or managing your Housekeeping? N -  Some recent data might be hidden    Patient Care Team: Steele Sizer, MD as PCP - General (Family Medicine) Kate Sable, MD as PCP - Cardiology (Cardiology) Vladimir Crofts, MD as Consulting Physician (Neurology) Rickard Patience, MD as Consulting Physician (Surgery) Dasher, Rayvon Char, MD as Consulting Physician (Dermatology)  Indicate any recent Medical Services you may have received from other than [facility] providers in the past year (date may be approximate).     Assessment:   This is a routine wellness examination for [place].  Hearing/Vision screen  Hearing Screening   '125Hz'  '250Hz'  '500Hz'  '1000Hz'  '2000Hz'  '3000Hz'  '4000Hz'  '6000Hz'  '8000Hz'   Right ear:           Left ear:           Comments: Pt wears Bose hearphones to assist with hearing  Vision Screening Comments: Annual vision screenings at [place] issues and exercise activities discussed: Current Exercise Habits: The patient does not participate in regular exercise at present, Exercise  limited by: None identified  Goals    . Patient Stated  Patient states he would like to remain active and healthy over the next year.       Depression Screen PHQ 2/9 Scores 06/02/2020 01/29/2020 10/25/2019 07/24/2019 04/22/2019 01/22/2019 10/17/2018  PHQ - 2 Score 0 0 0 1 0 0 0  PHQ- 9 Score - 0 0 '2 1 1 2    ' Fall Risk Fall Risk  06/02/2020 01/29/2020 10/25/2019 07/24/2019 04/22/2019  Falls in the past year? 0 0 - 0 0  Number falls in past yr: 0 0 0 0 0  Injury with Fall? 0 0 0 0 0  Comment - - - - -  Risk for fall due to : No Fall Risks - - - -  Follow up Falls prevention discussed - - - -    Any stairs in or around the home? Yes  If so, are there any without handrails? Yes  Home free of loose throw rugs in walkways, pet beds, electrical cords, etc? Yes  Adequate lighting in your home to reduce risk of falls? Yes   ASSISTIVE DEVICES UTILIZED TO PREVENT FALLS:  Life alert? No  Use of a cane, walker or w/c? No  Grab bars in the bathroom? No  Shower chair or bench in shower? Yes  Elevated toilet seat or a handicapped toilet? No   TIMED UP AND GO:  Was the test performed? Yes .  Length of time to ambulate 10 feet: 5 sec.   Gait steady and fast without use of assistive device  Cognitive Function: 6CIT deferred for 2021 AWV; pt has no memory issues.         Immunizations Immunization History  Administered Date(s) Administered  . Fluad Quad(high Dose 65+) 09/05/2019  . Influenza Split 10/21/2008, 08/14/2012  . Influenza, High Dose Seasonal PF 07/29/2015, 09/16/2016, 08/13/2018  . Influenza, Seasonal, Injecte, Preservative Fre 09/20/2011  . Influenza,inj,Quad PF,6+ Mos 09/18/2013  . Influenza-Unspecified 09/18/2013  . PFIZER SARS-COV-2 Vaccination 01/31/2020, 02/25/2020  . Pneumococcal Conjugate-13 03/17/2010, 02/09/2016  . Pneumococcal Polysaccharide-23 03/17/2010, 07/04/2017  . Td 10/18/2006  . Tdap 10/18/2006, 10/04/2017  . Zoster 08/14/2012    TDAP status: Up to  date   Flu Vaccine status: Up to date   Pneumococcal vaccine status: Up to date   Covid-19 vaccine status: Completed vaccines  Qualifies for Shingles Vaccine? Yes   Zostavax completed No   Shingrix Completed?: No.    Education has been provided regarding the importance of this vaccine. Patient has been advised to call insurance company to determine out of pocket expense if they have not yet received this vaccine. Advised may also receive vaccine at local pharmacy or Health Dept. Verbalized acceptance and understanding.  Screening Tests Health Maintenance  Topic Date Due  . HEMOGLOBIN A1C  04/23/2020  . URINE MICROALBUMIN  07/23/2020  . INFLUENZA VACCINE  07/05/2020  . FOOT EXAM  10/24/2020  . OPHTHALMOLOGY EXAM  01/26/2021  . COLONOSCOPY  03/31/2025  . TETANUS/TDAP  10/05/2027  . COVID-19 Vaccine  Completed  . Hepatitis C Screening  Completed  . PNA vac Low Risk Adult  Completed    Health Maintenance  Health Maintenance Due  Topic Date Due  . HEMOGLOBIN A1C  04/23/2020  . URINE MICROALBUMIN  07/23/2020    Colorectal cancer screening: Completed 03/31/20. Repeat every 5 years  Lung Cancer Screening: (Low Dose CT Chest recommended if Age 34-80 years, 30 pack-year currently smoking OR have quit w/in 15years.) does not qualify.   Additional Screening:  Hepatitis C Screening: does qualify; Completed 09/10/12  Vision Screening: Recommended annual ophthalmology exams for early detection of glaucoma and other disorders of the eye. Is the patient up to date with their annual eye exam?  Yes  Who is the provider or what is the name of the office in which the patient attends annual eye exams? [place] Screening: Recommended annual dental exams for proper oral hygiene  Community Resource Referral / Chronic Care Management: CRR required this visit?  No   CCM required this visit?  No      Plan:     I have personally reviewed and noted the following in the  patient's chart:   . Medical and social history . Use of alcohol, tobacco or illicit drugs  . Current medications and supplements . Functional ability and status . Nutritional status . Physical activity . Advanced directives . List of other physicians . Hospitalizations, surgeries, and ER visits in previous 12 months . Vitals . Screenings to include cognitive, depression, and falls . Referrals and appointments  In addition, I have reviewed and discussed with patient certain preventive protocols, quality metrics, and best practice recommendations. A written personalized care plan for preventive services as well as general preventive health recommendations were provided to patient.     Clemetine Marker, LPN   01/12/1387   Nurse Notes: pt doing well and appreciative of visit today

## 2020-06-03 ENCOUNTER — Ambulatory Visit: Payer: Medicare PPO | Admitting: Family Medicine

## 2020-06-03 ENCOUNTER — Encounter: Payer: Self-pay | Admitting: Family Medicine

## 2020-06-03 VITALS — BP 112/72 | HR 84 | Temp 98.1°F | Resp 16 | Ht 69.0 in | Wt 213.4 lb

## 2020-06-03 DIAGNOSIS — D508 Other iron deficiency anemias: Secondary | ICD-10-CM

## 2020-06-03 DIAGNOSIS — R519 Headache, unspecified: Secondary | ICD-10-CM

## 2020-06-03 DIAGNOSIS — E1140 Type 2 diabetes mellitus with diabetic neuropathy, unspecified: Secondary | ICD-10-CM

## 2020-06-03 DIAGNOSIS — E1129 Type 2 diabetes mellitus with other diabetic kidney complication: Secondary | ICD-10-CM | POA: Diagnosis not present

## 2020-06-03 DIAGNOSIS — R351 Nocturia: Secondary | ICD-10-CM

## 2020-06-03 DIAGNOSIS — F411 Generalized anxiety disorder: Secondary | ICD-10-CM

## 2020-06-03 DIAGNOSIS — R809 Proteinuria, unspecified: Secondary | ICD-10-CM | POA: Diagnosis not present

## 2020-06-03 DIAGNOSIS — N401 Enlarged prostate with lower urinary tract symptoms: Secondary | ICD-10-CM

## 2020-06-03 DIAGNOSIS — N521 Erectile dysfunction due to diseases classified elsewhere: Secondary | ICD-10-CM

## 2020-06-03 DIAGNOSIS — E785 Hyperlipidemia, unspecified: Secondary | ICD-10-CM | POA: Diagnosis not present

## 2020-06-03 DIAGNOSIS — E782 Mixed hyperlipidemia: Secondary | ICD-10-CM

## 2020-06-03 DIAGNOSIS — E1169 Type 2 diabetes mellitus with other specified complication: Secondary | ICD-10-CM

## 2020-06-03 DIAGNOSIS — K219 Gastro-esophageal reflux disease without esophagitis: Secondary | ICD-10-CM

## 2020-06-03 DIAGNOSIS — F339 Major depressive disorder, recurrent, unspecified: Secondary | ICD-10-CM | POA: Diagnosis not present

## 2020-06-03 LAB — POCT GLYCOSYLATED HEMOGLOBIN (HGB A1C): Hemoglobin A1C: 6.2 % — AB (ref 4.0–5.6)

## 2020-06-03 MED ORDER — ALPRAZOLAM 1 MG PO TABS: 0.5000 mg | ORAL_TABLET | Freq: Every day | ORAL | 0 refills | Status: DC | PRN

## 2020-06-03 MED ORDER — AMPHETAMINE-DEXTROAMPHETAMINE 15 MG PO TABS: 15.0000 mg | ORAL_TABLET | Freq: Two times a day (BID) | ORAL | 0 refills | Status: DC

## 2020-06-03 MED ORDER — TAMSULOSIN HCL 0.4 MG PO CAPS: 0.4000 mg | ORAL_CAPSULE | Freq: Every day | ORAL | 1 refills | Status: DC

## 2020-06-03 MED ORDER — ATORVASTATIN CALCIUM 80 MG PO TABS: 80.0000 mg | ORAL_TABLET | Freq: Every day | ORAL | 1 refills | Status: DC

## 2020-06-03 MED ORDER — OMEGA-3-ACID ETHYL ESTERS 1 G PO CAPS: 2.0000 g | ORAL_CAPSULE | Freq: Two times a day (BID) | ORAL | 1 refills | Status: DC

## 2020-06-03 MED ORDER — BUPROPION HCL ER (XL) 300 MG PO TB24: 300.0000 mg | ORAL_TABLET | Freq: Every day | ORAL | 1 refills | Status: DC

## 2020-06-03 MED ORDER — FAMOTIDINE 20 MG PO TABS: 20.0000 mg | ORAL_TABLET | Freq: Every day | ORAL | 1 refills | Status: DC

## 2020-06-03 MED ORDER — OMEPRAZOLE 40 MG PO CPDR: 40.0000 mg | DELAYED_RELEASE_CAPSULE | Freq: Every evening | ORAL | 1 refills | Status: DC

## 2020-06-03 NOTE — Progress Notes (Signed)
Name: Shane Boyd.   MRN: [id number]    DOB: 1951-10-20   Date:06/03/2020       Progress Note  Subjective  Chief Complaint  Chief Complaint  Patient presents with  . Follow-up  . Diabetes    HPI  GERD: he switched to Omeprazole at night and H2 blocker in am's and seems to be doing better, he still takes Tums prn during the day, but sleeps better at night . He still needs to avoid trigger foods like onions   Dyslipidemia : he is taking Lovaza BIDand atorvastatin, he needs to take evening dose with a snack to tolerate diet , no myalgia.Last LDL was 47. 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 His hgbA1Cwas at goal for a long time, last A1C was 6.5% it went up to  8.6% but last visit was down again to 6.2 % , glucose has been in the 125's.  He has dyslipidemia and diabetic neuropathy, he states Lyrica is working very well for him, he has been taking it BID and sometimes he remembers to take one extra capsule during the day , he is on Cialis for ED and doing well.   Dizziness: he has episodes of dizziness that can happen any time of the day, associated with nausea and vomiting, it can last daily. It is described as the room spinning. He denies tinnitus or associated headaches. Seen by Dr. Melrose Nakayama in the past and was given Pamelor, he has been off medication an no episodes in a long time.   Major Depressive Disorder:heis on wellbutrin and very seldom taking alprazolam but is due for a refill, he is doing well on Adderal for motivation and helps with anhedonia.   GAD: he is down to 35 pills every 3 months and we will continue current amount for now.   Hearing loss: he was advised to have hearing aid, but he decided to by a [place] hear phones.Unchanged   Anemia Iron deficiency: he was seen by Dr. Durwin Reges and he needs to have EGD and colonoscopy, but because he felt SOB when he was cleaning his yard and  he needs a cardiac clearance prior to having the procedures. He has seen Dr. Durwin Reges and had EGD and colonoscopy on 03/2020 and still unknown source of bleeding, advised to have capsule endoscopy but not sure if he will go because he does not want to have prep done again at this time  BPH: currently doing well on cialis an flomax, he states once he started medication his urinary urgency resolved, nocturia only once per night, we will check PSA today   Patient Active Problem List   Diagnosis Date Noted  . Iron deficiency anemia due to chronic blood loss   . Polyp of ascending colon   . 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 Right ureteral stone 08/15/2018  . Bilateral inguinal hernia without obstruction or gangrene 08/13/2018  . Fatty liver 08/13/2018  . Difficulty balancing 05/16/2017  . Special screening for malignant neoplasms, colon   . Benign neoplasm of ascending colon   . Benign neoplasm of transverse colon   . Hearing loss, sensorineural, unilateral 10/21/2016  . Dyslipidemia with low high density lipoprotein (HDL) cholesterol with hypertriglyceridemia due to type 2 diabetes mellitus ([place]) 09/16/2016  . GAD (generalized anxiety disorder) 09/16/2016  . Genital herpes 06/08/2016  . Dizziness 03/09/2016  . History of  nonmelanoma skin cancer 09/30/2015  . Neck pain 07/29/2015  . Anxiety 07/19/2015  . Insomnia, persistent 07/19/2015  . CN (constipation) 07/19/2015  . 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 Dyslipidemia 07/19/2015  . Gastric reflux 07/19/2015  . History of shingles 07/19/2015  . History of basal cell cancer 07/19/2015  . H/O Malignant melanoma 07/19/2015  . Personal history of malignant neoplasm of head and neck 07/19/2015  . Chronic recurrent major depressive disorder ([place]) 07/19/2015  . Obesity (BMI 30.0-34.9) 07/19/2015  . ED (erectile dysfunction) 07/19/2015    Past Surgical History:   Procedure Laterality Date  . ARM SKIN LESION BIOPSY / EXCISION Right 05/18/2017  . COLONOSCOPY    . COLONOSCOPY WITH PROPOFOL N/A 12/15/2016   Procedure: COLONOSCOPY WITH PROPOFOL;  Surgeon: Lucilla Lame, MD;  Location: [facility];  Service: Endoscopy;  Laterality: N/A;  diabetic - oral meds  . COLONOSCOPY WITH PROPOFOL N/A 03/31/2020   Procedure: COLONOSCOPY WITH PROPOFOL;  Surgeon: Lucilla Lame, MD;  Location[facility] ENDOSCOPY;  Service: Endoscopy;  Laterality: N/A;  . ESOPHAGOGASTRODUODENOSCOPY (EGD) WITH PROPOFOL N/A 03/31/2020   Procedure: ESOPHAGOGASTRODUODENOSCOPY (EGD) WITH PROPOFOL;  Surgeon: Lucilla Lame, MD;  Location: [facility] ENDOSCOPY;  Service: Endoscopy;  Laterality: N/A;  . NASAL SEPTUM SURGERY    . POLYPECTOMY  12/15/2016   Procedure: POLYPECTOMY;  Surgeon: Lucilla Lame, MD;  Location: [facility];  Service: Endoscopy;;  . SENTINEL NODE BIOPSY Right 05/18/2017   [facility]    Family History  Problem Relation Age of Onset  . Diabetes Mother   . Heart disease Mother   . Hyperlipidemia Mother   . CVA Father   . Cancer Father        Colon  . Depression Father     Social History   Tobacco Use  . Smoking status: Never Smoker  . Smokeless tobacco: Never Used  Substance Use Topics  . Alcohol use: Yes    Alcohol/week: 0.0 standard drinks    Comment: Holidays     Current Outpatient Medications:  .  ALPRAZolam (XANAX) 1 MG tablet, TAKE 1/2 TO 1 (ONE-HALF TO ONE) TABLET BY MOUTH AT BEDTIME AS NEEDED FOR ANXIETY, Disp: 35 tablet, Rfl: 0 .  amphetamine-dextroamphetamine (ADDERALL) 15 MG tablet, Take 1 tablet by mouth 2 (two) times daily., Disp: 60 tablet, Rfl: 0 .  atorvastatin (LIPITOR) 80 MG tablet, Take 1 tablet by mouth once daily, Disp: 90 tablet, Rfl: 0 .  buPROPion (WELLBUTRIN XL) 300 MG 24 hr tablet, Take 1 tablet (300 mg total) by mouth daily., Disp: 90 tablet, Rfl: 1 .  cholecalciferol (VITAMIN D3) 25 MCG (1000 UNIT) tablet, Take 1,000 Units by mouth daily.,  Disp: , Rfl:  .  docusate sodium (COLACE) 100 MG capsule, Take 1 capsule (100 mg total) by mouth 2 (two) times daily., Disp: 30 capsule, Rfl: 0 .  famotidine (PEPCID) 20 MG tablet, Take 1 tablet (20 mg total) by mouth daily., Disp: 90 tablet, Rfl: 1 .  ferrous sulfate 325 (65 FE) MG EC tablet, Take 1 tablet (325 mg total) by mouth 2 (two) times daily., Disp: 180 tablet, Rfl: 0 .  fluoruracil (CARAC) 0.5 % cream, Apply to affected areas of scalp, foreheard and temples twice dialy for 4-5 days., Disp: , Rfl: 0 .  ibuprofen (MOTRIN IB) 200 MG tablet, Take 3 tablets (600 mg total) by mouth every 6 (six) hours as needed., Disp: 20 tablet, Rfl: 0 .  metFORMIN (GLUCOPHAGE-XR) 750 MG 24 hr tablet, TAKE  2 TABLETS BY MOUTH ONCE DAILY WITH BREAKFAST, Disp: 180 tablet, Rfl: 1 .  omega-3 acid ethyl esters (LOVAZA) 1 g capsule, Take 2 capsules (2 g total) by mouth 2 (two) times daily., Disp: 480 capsule, Rfl: 1 .  omeprazole (PRILOSEC) 40 MG capsule, Take 1 capsule (40 mg total) by mouth every evening., Disp: 90 capsule, Rfl: 1 .  pregabalin (LYRICA) 50 MG capsule, TAKE 1 CAPSULE BY MOUTH THREE TIMES DAILY, Disp: 270 capsule, Rfl: 1 .  tadalafil (CIALIS) 5 MG tablet, Take 1 tablet (5 mg total) by mouth daily as needed for erectile dysfunction., Disp: 90 tablet, Rfl: 1 .  tamsulosin (FLOMAX) 0.4 MG CAPS capsule, Take 1 capsule by mouth once daily, Disp: 90 capsule, Rfl: 0 .  valACYclovir (VALTREX) 500 MG tablet, TAKE ONE CAPLET BY MOUTH THREE TIMES DAILY, Disp: 90 tablet, Rfl: 1  No Known Allergies  I personally reviewed active problem list, medication list, allergies, family history, social history, health maintenance with the patient/caregiver today.   ROS  Constitutional: Negative for fever or significant  weight change.  Respiratory: Negative for cough and shortness of breath.   Cardiovascular: Negative for chest pain or palpitations.  Gastrointestinal: Negative for abdominal pain, no bowel changes.   Musculoskeletal: Negative for gait problem or joint swelling.  Skin: Negative for rash.  Neurological: Negative for dizziness or headache.  No other specific complaints in a complete review of systems (except as listed in HPI above).  Objective  Vitals:   06/03/20 1351  BP: 112/72  Pulse: 84  Resp: 16  Temp: 98.1 F (36.7 C)  TempSrc: Temporal  SpO2: 98%  Weight: 213 lb 6.4 oz (96.8 kg)  Height: 5\' 9"  (1.753 m)    Body mass index is 31.51 kg/m.  Physical Exam  Constitutional: Patient appears well-developed and well-nourished. Obese  No distress.  HEENT: head atraumatic, normocephalic, pupils equal and reactive to light,  neck supple Cardiovascular: Normal rate, regular rhythm and normal heart sounds.  No murmur heard. No BLE edema. Pulmonary/Chest: Effort normal and breath sounds normal. No respiratory distress. Abdominal: Soft.  There is no tenderness. Psychiatric: Patient has a normal mood and affect. behavior is normal. Judgment and thought content normal.  Recent Results (from the past 2160 hour(s))  SARS CORONAVIRUS 2 (TAT 6-24 HRS) Nasopharyngeal Nasopharyngeal Swab     Status: None   Collection Time: 03/27/20 10:43 AM   Specimen: Nasopharyngeal Swab  Result Value Ref Range   SARS Coronavirus 2 NEGATIVE NEGATIVE    Comment: (NOTE) SARS-CoV-2 target nucleic acids are NOT DETECTED. The SARS-CoV-2 RNA is generally detectable in upper and lower respiratory specimens during the acute phase of infection. Negative results do not preclude SARS-CoV-2 infection, do not rule out co-infections with other pathogens, and should not be used as the sole basis for treatment or other patient management decisions. Negative results must be combined with clinical observations, patient history, and epidemiological information. The expected result is Negative. Fact Sheet for Patients: SugarRoll.be Fact Sheet for Healthcare  Providers: https://www.woods-mathews.com/ This test is not yet approved or cleared by the [place] FDA and  has been authorized for detection and/or diagnosis of SARS-CoV-2 by FDA under an Emergency Use Authorization (EUA). This EUA will remain  in effect (meaning this test can be used) for the duration of the COVID-19 declaration under Section 56 4(b)(1) of the Act, 21 U.S.C. section 360bbb-3(b)(1), unless the authorization is terminated or revoked sooner. Performed at [facility], [place] [address],  [place], [place] [address]   Glucose, capillary     Status: Abnormal   Collection Time: 03/31/20  7:43 AM  Result Value Ref Range   Glucose-Capillary 100 (H) 70 - 99 mg/dL    Comment: Glucose reference range applies only to samples taken after fasting for at least 8 hours.  Surgical pathology     Status: None   Collection Time: 03/31/20  8:17 AM  Result Value Ref Range   SURGICAL PATHOLOGY      SURGICAL PATHOLOGY CASE: [id number] PATIENT: Hansel Starling Surgical Pathology Report     Specimen Submitted: A. Duodenum; cbx B. Colon polyp x3, ascending; cbx C. Colon polyp, transverse; cbx  Clinical History: Iron deficiency anemia D50.0.  R/O celiac spru; diverticulosis, polyps    DIAGNOSIS: A. DUODENUM; COLD BIOPSY: - ENTERIC MUCOSA WITH PRESERVED VILLOUS ARCHITECTURE AND NO SIGNIFICANT HISTOPATHOLOGIC CHANGE. - NEGATIVE FOR FEATURES OF CELIAC, DYSPLASIA, AND MALIGNANCY.  B. COLON POLYPS X3, ASCENDING; COLD BIOPSY: - FRAGMENTS (X3) OF TUBULAR ADENOMAS. - NEGATIVE FOR HIGH-GRADE DYSPLASIA AND MALIGNANCY.  C. COLON POLYP, TRANSVERSE; COLD BIOPSY: - TUBULAR ADENOMA. - NEGATIVE FOR HIGH-GRADE DYSPLASIA AND MALIGNANCY.  GROSS DESCRIPTION: A. Labeled: Duodenal C BXs rule out celiac sprue Received: Formalin Tissue fragment(s): 3 Size: 0.2-0.3 cm Description: Tan soft tissue fragments Entirely submitted in 1 cassette.  B. Labeled: Ascending colon   polyp x3 C BXs Received: Formalin Tissue fragment(s): Multiple Size: Aggregate, 0.4 x 0.4 x 0.1 cm Description: Tan soft tissue fragments Entirely submitted in 1 cassette.  C. Labeled: Transverse colon polyp C BXs Received: Formalin Tissue fragment(s): 1 Size: 0.3 cm Description: Tan soft tissue fragment Entirely submitted in 1 cassette.   Final Diagnosis performed by Allena Napoleon, MD.   Electronically signed 04/01/2020 9:11:51AM The electronic signature indicates that the named Attending Pathologist has evaluated the specimen Technical component performed at [facility], [address] [place], [place] [address] Lab: [phone] Dir: Rush Farmer, MD, MMM  Professional component performed at [facility], [facility], [place], [place], [place] [address] Lab: [phone] Dir: Dellia Nims. Rubinas, MD   POCT glycosylated hemoglobin (Hb A1C)     Status: Abnormal   Collection Time: 06/03/20  2:04 PM  Result Value Ref Range   Hemoglobin A1C 6.2 (A) 4.0 - 5.6 %   HbA1c POC (<> result, manual entry)     HbA1c, POC (prediabetic range)     HbA1c, POC (controlled diabetic range)        PHQ2/9: Depression screen [facility] 2/9 06/03/2020 06/02/2020 01/29/2020 10/25/2019 07/24/2019  Decreased Interest 0 0 0 0 0  Down, Depressed, Hopeless 0 0 0 0 1  PHQ - 2 Score 0 0 0 0 1  Altered sleeping 0 - 0 0 0  Tired, decreased energy 0 - 0 0 1  Change in appetite 0 - 0 0 0  Feeling bad or failure about yourself  0 - 0 0 0  Trouble concentrating 0 - 0 0 0  Moving slowly or fidgety/restless 0 - 0 0 0  Suicidal thoughts 0 - 0 0 0  PHQ-9 Score 0 - 0 0 2  Difficult doing work/chores Not difficult at all - - - Not difficult at all  Some recent data might be hidden    phq 9 is negative   Fall Risk: Fall Risk  06/03/2020 06/02/2020 01/29/2020 10/25/2019 07/24/2019  Falls in the past year? 0 0 0 - 0  Number falls in past yr: 0 0 0 0 0  Injury with Fall?  0 0 0 0 0  Comment - - - - -  Risk  for fall due to : - No Fall Risks - - -  Follow up - Falls prevention discussed - - -      Assessment & Plan  1. Diabetes mellitus with microalbuminuria (HCC)  - POCT glycosylated hemoglobin (Hb A1C) - COMPLETE METABOLIC PANEL WITH GFR  2. Chronic recurrent major depressive disorder (HCC)  - buPROPion (WELLBUTRIN XL) 300 MG 24 hr tablet; Take 1 tablet (300 mg total) by mouth daily.  Dispense: 90 tablet; Refill: 1 - amphetamine-dextroamphetamine (ADDERALL) 15 MG tablet; Take 1 tablet by mouth 2 (two) times daily.  Dispense: 60 tablet; Refill: 0 - amphetamine-dextroamphetamine (ADDERALL) 15 MG tablet; Take 1 tablet by mouth 2 (two) times daily.  Dispense: 60 tablet; Refill: 0 - amphetamine-dextroamphetamine (ADDERALL) 15 MG tablet; Take 1 tablet by mouth 2 (two) times daily.  Dispense: 60 tablet; Refill: 0  3. Dyslipidemia  - atorvastatin (LIPITOR) 80 MG tablet; Take 1 tablet (80 mg total) by mouth daily.  Dispense: 90 tablet; Refill: 1  4. BPH associated with nocturia  - tamsulosin (FLOMAX) 0.4 MG CAPS capsule; Take 1 capsule (0.4 mg total) by mouth daily.  Dispense: 90 capsule; Refill: 1 - PSA  5. Gastric reflux  - omeprazole (PRILOSEC) 40 MG capsule; Take 1 capsule (40 mg total) by mouth every evening.  Dispense: 90 capsule; Refill: 1 - famotidine (PEPCID) 20 MG tablet; Take 1 tablet (20 mg total) by mouth daily.  Dispense: 90 tablet; Refill: 1  6. 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 Intractable episodic headache, unspecified headache type  Doing better lately   8. GAD (generalized anxiety disorder)  - ALPRAZolam (XANAX) 1 MG tablet; Take 0.5-1 tablets (0.5-1 mg total) by mouth daily as needed for anxiety.  Dispense: 35 tablet; Refill: 0  9. Other iron deficiency anemia  - CBC with Differential/Platelet - Iron, TIBC and Ferritin Panel  10. Dyslipidemia with low high density lipoprotein (HDL) cholesterol with  hypertriglyceridemia due to type 2 diabetes mellitus (HCC)  - omega-3 acid ethyl esters (LOVAZA) 1 g capsule; Take 2 capsules (2 g total) by mouth 2 (two) times daily.  Dispense: 360 capsule; Refill: 1

## 2020-06-04 DIAGNOSIS — D508 Other iron deficiency anemias: Secondary | ICD-10-CM | POA: Diagnosis not present

## 2020-06-04 DIAGNOSIS — N401 Enlarged prostate with lower urinary tract symptoms: Secondary | ICD-10-CM | POA: Diagnosis not present

## 2020-06-04 DIAGNOSIS — E1129 Type 2 diabetes mellitus with other diabetic kidney complication: Secondary | ICD-10-CM | POA: Diagnosis not present

## 2020-06-04 DIAGNOSIS — R809 Proteinuria, unspecified: Secondary | ICD-10-CM | POA: Diagnosis not present

## 2020-06-05 LAB — IRON,TIBC AND FERRITIN PANEL
%SAT: 22 % (calc) (ref 20–48)
Ferritin: 25 ng/mL (ref 24–380)
Iron: 73 ug/dL (ref 50–180)
TIBC: 328 mcg/dL (calc) (ref 250–425)

## 2020-06-05 LAB — PSA: PSA: 1 ng/mL (ref <=4.0)

## 2020-06-05 LAB — COMPLETE METABOLIC PANEL WITH GFR
AG Ratio: 2.4 (calc) (ref 1.0–2.5)
ALT: 16 U/L (ref 9–46)
AST: 19 U/L (ref 10–35)
Albumin: 4.7 g/dL (ref 3.6–5.1)
Alkaline phosphatase (APISO): 87 U/L (ref 35–144)
BUN: 13 mg/dL (ref 7–25)
CO2: 28 mmol/L (ref 20–32)
Calcium: 9.6 mg/dL (ref 8.6–10.3)
Chloride: 104 mmol/L (ref 98–110)
Creat: 0.99 mg/dL (ref 0.70–1.18)
GFR, Est African American: 89 mL/min/{1.73_m2} (ref >=60)
GFR, Est Non African American: 77 mL/min/{1.73_m2} (ref >=60)
Globulin: 2 g/dL (calc) (ref 1.9–3.7)
Glucose, Bld: 114 mg/dL — ABNORMAL HIGH (ref 65–99)
Potassium: 4.7 mmol/L (ref 3.5–5.3)
Sodium: 140 mmol/L (ref 135–146)
Total Bilirubin: 0.5 mg/dL (ref 0.2–1.2)
Total Protein: 6.7 g/dL (ref 6.1–8.1)

## 2020-06-05 LAB — CBC WITH DIFFERENTIAL/PLATELET
Absolute Monocytes: 504 cells/uL (ref 200–950)
Basophils Absolute: 51 cells/uL (ref 0–200)
Basophils Relative: 0.7 %
Eosinophils Absolute: 183 cells/uL (ref 15–500)
Eosinophils Relative: 2.5 %
HCT: 42.2 % (ref 38.5–50.0)
Hemoglobin: 13.8 g/dL (ref 13.2–17.1)
Lymphs Abs: 2599 cells/uL (ref 850–3900)
MCH: 27.5 pg (ref 27.0–33.0)
MCHC: 32.7 g/dL (ref 32.0–36.0)
MCV: 84.2 fL (ref 80.0–100.0)
MPV: 11.1 fL (ref 7.5–12.5)
Monocytes Relative: 6.9 %
Neutro Abs: 3964 cells/uL (ref 1500–7800)
Neutrophils Relative %: 54.3 %
Platelets: 199 10*3/uL (ref 140–400)
RBC: 5.01 10*6/uL (ref 4.20–5.80)
RDW: 14.6 % (ref 11.0–15.0)
Total Lymphocyte: 35.6 %
WBC: 7.3 10*3/uL (ref 3.8–10.8)

## 2020-06-21 ENCOUNTER — Encounter: Payer: Self-pay | Admitting: Family Medicine

## 2020-06-23 ENCOUNTER — Encounter: Payer: Self-pay | Admitting: Family Medicine

## 2020-06-23 ENCOUNTER — Other Ambulatory Visit: Payer: Self-pay | Admitting: Family Medicine

## 2020-06-23 DIAGNOSIS — F339 Major depressive disorder, recurrent, unspecified: Secondary | ICD-10-CM

## 2020-06-23 DIAGNOSIS — F411 Generalized anxiety disorder: Secondary | ICD-10-CM

## 2020-06-23 MED ORDER — AMPHETAMINE-DEXTROAMPHETAMINE 15 MG PO TABS: 15.0000 mg | ORAL_TABLET | Freq: Two times a day (BID) | ORAL | 0 refills | Status: DC

## 2020-06-23 MED ORDER — ALPRAZOLAM 1 MG PO TABS: 0.5000 mg | ORAL_TABLET | Freq: Every day | ORAL | 0 refills | Status: DC | PRN

## 2020-06-24 ENCOUNTER — Encounter: Payer: Self-pay | Admitting: Family Medicine

## 2020-06-24 ENCOUNTER — Telehealth: Payer: Self-pay

## 2020-06-25 ENCOUNTER — Encounter: Payer: Self-pay | Admitting: Family Medicine

## 2020-06-25 NOTE — Telephone Encounter (Signed)
Spoke with patient and relayed message per Dr. Ancil Boozer. Patient expressed understanding but was distraught there was not an emergency plan in place for insurance/etc to cover in events such as him. I offered encouragement and sincere apologies.

## 2020-06-28 ENCOUNTER — Other Ambulatory Visit: Payer: Self-pay | Admitting: Family Medicine

## 2020-06-28 DIAGNOSIS — A6000 Herpesviral infection of urogenital system, unspecified: Secondary | ICD-10-CM

## 2020-06-28 MED ORDER — VALACYCLOVIR HCL 500 MG PO TABS: ORAL_TABLET | ORAL | 1 refills | Status: DC

## 2020-08-20 DIAGNOSIS — Z85828 Personal history of other malignant neoplasm of skin: Secondary | ICD-10-CM | POA: Diagnosis not present

## 2020-08-20 DIAGNOSIS — D2272 Melanocytic nevi of left lower limb, including hip: Secondary | ICD-10-CM | POA: Diagnosis not present

## 2020-08-20 DIAGNOSIS — X32XXXA Exposure to sunlight, initial encounter: Secondary | ICD-10-CM | POA: Diagnosis not present

## 2020-08-20 DIAGNOSIS — Z85820 Personal history of malignant melanoma of skin: Secondary | ICD-10-CM | POA: Diagnosis not present

## 2020-08-20 DIAGNOSIS — D2262 Melanocytic nevi of left upper limb, including shoulder: Secondary | ICD-10-CM | POA: Diagnosis not present

## 2020-08-20 DIAGNOSIS — D225 Melanocytic nevi of trunk: Secondary | ICD-10-CM | POA: Diagnosis not present

## 2020-08-20 DIAGNOSIS — D2261 Melanocytic nevi of right upper limb, including shoulder: Secondary | ICD-10-CM | POA: Diagnosis not present

## 2020-08-20 DIAGNOSIS — L570 Actinic keratosis: Secondary | ICD-10-CM | POA: Diagnosis not present

## 2020-08-25 ENCOUNTER — Encounter: Payer: Self-pay | Admitting: Family Medicine
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
## 2020-09-09 ENCOUNTER — Other Ambulatory Visit: Payer: Self-pay

## 2020-09-09 ENCOUNTER — Ambulatory Visit: Payer: Medicare PPO | Admitting: Family Medicine

## 2020-09-09 ENCOUNTER — Encounter: Payer: Self-pay | Admitting: Family Medicine

## 2020-09-09 VITALS — BP 124/74 | HR 97 | Temp 98.1°F | Resp 18 | Ht 69.0 in | Wt 213.1 lb

## 2020-09-09 DIAGNOSIS — E1140 Type 2 diabetes mellitus with diabetic neuropathy, unspecified: Secondary | ICD-10-CM | POA: Diagnosis not present

## 2020-09-09 DIAGNOSIS — F339 Major depressive disorder, recurrent, unspecified: Secondary | ICD-10-CM | POA: Diagnosis not present

## 2020-09-09 DIAGNOSIS — E782 Mixed hyperlipidemia: Secondary | ICD-10-CM

## 2020-09-09 DIAGNOSIS — E1129 Type 2 diabetes mellitus with other diabetic kidney complication: Secondary | ICD-10-CM | POA: Diagnosis not present

## 2020-09-09 DIAGNOSIS — E785 Hyperlipidemia, unspecified: Secondary | ICD-10-CM

## 2020-09-09 DIAGNOSIS — E1169 Type 2 diabetes mellitus with other specified complication: Secondary | ICD-10-CM | POA: Diagnosis not present

## 2020-09-09 DIAGNOSIS — F411 Generalized anxiety disorder: Secondary | ICD-10-CM | POA: Diagnosis not present

## 2020-09-09 DIAGNOSIS — N401 Enlarged prostate with lower urinary tract symptoms: Secondary | ICD-10-CM | POA: Diagnosis not present

## 2020-09-09 DIAGNOSIS — Z23 Encounter for immunization: Secondary | ICD-10-CM | POA: Diagnosis not present

## 2020-09-09 DIAGNOSIS — R809 Proteinuria, unspecified: Secondary | ICD-10-CM | POA: Diagnosis not present

## 2020-09-09 DIAGNOSIS — R351 Nocturia: Secondary | ICD-10-CM

## 2020-09-09 MED ORDER — AMPHETAMINE-DEXTROAMPHETAMINE 15 MG PO TABS: 15.0000 mg | ORAL_TABLET | Freq: Two times a day (BID) | ORAL | 0 refills | Status: DC

## 2020-09-09 MED ORDER — BUSPIRONE HCL 5 MG PO TABS: 5.0000 mg | ORAL_TABLET | Freq: Two times a day (BID) | ORAL | 0 refills | Status: DC | PRN

## 2020-09-09 MED ORDER — ALPRAZOLAM 1 MG PO TABS: 0.5000 mg | ORAL_TABLET | Freq: Every day | ORAL | 0 refills | Status: DC | PRN

## 2020-09-09 MED ORDER — PREGABALIN 50 MG PO CAPS: 50.0000 mg | ORAL_CAPSULE | Freq: Three times a day (TID) | ORAL | 1 refills | Status: DC

## 2020-09-10 LAB — POCT GLYCOSYLATED HEMOGLOBIN (HGB A1C): Hemoglobin A1C: 6.5 % — AB (ref 4.0–5.6)

## 2020-12-11 ENCOUNTER — Other Ambulatory Visit: Payer: Self-pay | Admitting: Family Medicine

## 2020-12-11 DIAGNOSIS — E1140 Type 2 diabetes mellitus with diabetic neuropathy, unspecified: Secondary | ICD-10-CM

## 2020-12-11 DIAGNOSIS — K219 Gastro-esophageal reflux disease without esophagitis: Secondary | ICD-10-CM

## 2020-12-11 DIAGNOSIS — N401 Enlarged prostate with lower urinary tract symptoms: Secondary | ICD-10-CM
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
## 2020-12-16 ENCOUNTER — Ambulatory Visit: Payer: Medicare PPO | Admitting: Family Medicine

## 2020-12-16 ENCOUNTER — Other Ambulatory Visit: Payer: Self-pay

## 2020-12-16 ENCOUNTER — Encounter: Payer: Self-pay | Admitting: Family Medicine

## 2020-12-16 VITALS — BP 140/88 | HR 78 | Temp 98.0°F | Resp 18 | Ht 69.0 in | Wt 216.3 lb

## 2020-12-16 DIAGNOSIS — R1031 Right lower quadrant pain: Secondary | ICD-10-CM

## 2020-12-16 DIAGNOSIS — N401 Enlarged prostate with lower urinary tract symptoms: Secondary | ICD-10-CM | POA: Diagnosis not present

## 2020-12-16 DIAGNOSIS — R1032 Left lower quadrant pain: Secondary | ICD-10-CM

## 2020-12-16 DIAGNOSIS — E1140 Type 2 diabetes mellitus with diabetic neuropathy, unspecified: Secondary | ICD-10-CM | POA: Diagnosis not present

## 2020-12-16 DIAGNOSIS — N521 Erectile dysfunction due to diseases classified elsewhere: Secondary | ICD-10-CM

## 2020-12-16 DIAGNOSIS — E1169 Type 2 diabetes mellitus with other specified complication: Secondary | ICD-10-CM

## 2020-12-16 DIAGNOSIS — K219 Gastro-esophageal reflux disease without esophagitis: Secondary | ICD-10-CM | POA: Diagnosis not present

## 2020-12-16 DIAGNOSIS — F411 Generalized anxiety disorder: Secondary | ICD-10-CM | POA: Diagnosis not present

## 2020-12-16 DIAGNOSIS — A6000 Herpesviral infection of urogenital system, unspecified: Secondary | ICD-10-CM

## 2020-12-16 DIAGNOSIS — E785 Hyperlipidemia, unspecified: Secondary | ICD-10-CM | POA: Diagnosis not present

## 2020-12-16 DIAGNOSIS — R351 Nocturia: Secondary | ICD-10-CM

## 2020-12-16 DIAGNOSIS — F339 Major depressive disorder, recurrent, unspecified: Secondary | ICD-10-CM

## 2020-12-16 DIAGNOSIS — E782 Mixed hyperlipidemia: Secondary | ICD-10-CM

## 2020-12-16 LAB — POCT GLYCOSYLATED HEMOGLOBIN (HGB A1C): Hemoglobin A1C: 6.3 % — AB (ref 4.0–5.6)

## 2020-12-16 MED ORDER — OMEPRAZOLE 40 MG PO CPDR: 40.0000 mg | DELAYED_RELEASE_CAPSULE | Freq: Every day | ORAL | 0 refills | Status: DC

## 2020-12-16 MED ORDER — ALPRAZOLAM 1 MG PO TABS: 0.5000 mg | ORAL_TABLET | Freq: Every day | ORAL | 0 refills | Status: DC | PRN

## 2020-12-16 MED ORDER — AMPHETAMINE-DEXTROAMPHETAMINE 15 MG PO TABS: 15.0000 mg | ORAL_TABLET | Freq: Two times a day (BID) | ORAL | 0 refills | Status: DC

## 2020-12-16 MED ORDER — OMEGA-3-ACID ETHYL ESTERS 1 G PO CAPS: 2.0000 g | ORAL_CAPSULE | Freq: Two times a day (BID) | ORAL | 1 refills | Status: DC

## 2020-12-16 MED ORDER — VALACYCLOVIR HCL 500 MG PO TABS: ORAL_TABLET | ORAL | 1 refills | Status: DC

## 2020-12-16 MED ORDER — ATORVASTATIN CALCIUM 80 MG PO TABS: 80.0000 mg | ORAL_TABLET | Freq: Every day | ORAL | 1 refills | Status: DC

## 2020-12-16 MED ORDER — BUPROPION HCL ER (XL) 300 MG PO TB24: 300.0000 mg | ORAL_TABLET | Freq: Every day | ORAL | 1 refills | Status: DC

## 2020-12-16 MED ORDER — METFORMIN HCL ER 750 MG PO TB24
ORAL_TABLET | ORAL | 1 refills | Status: DC
Start: 2020-12-16 — End: 2021-06-16

## 2020-12-16 MED ORDER — MELOXICAM 15 MG PO TABS: 15.0000 mg | ORAL_TABLET | Freq: Every day | ORAL | 0 refills | Status: DC | PRN

## 2020-12-17 ENCOUNTER — Encounter: Payer: Self-pay | Admitting: Family Medicine

## 2020-12-19 ENCOUNTER — Other Ambulatory Visit: Payer: Self-pay | Admitting: Family Medicine

## 2020-12-19 DIAGNOSIS — K219 Gastro-esophageal reflux disease without esophagitis: Secondary | ICD-10-CM

## 2020-12-30 ENCOUNTER — Encounter: Payer: Self-pay | Admitting: Family Medicine

## 2021-02-24 DIAGNOSIS — D225 Melanocytic nevi of trunk: Secondary | ICD-10-CM | POA: Diagnosis not present

## 2021-02-24 DIAGNOSIS — L570 Actinic keratosis: Secondary | ICD-10-CM | POA: Diagnosis not present

## 2021-02-24 DIAGNOSIS — Z85828 Personal history of other malignant neoplasm of skin: Secondary | ICD-10-CM | POA: Diagnosis not present

## 2021-02-24 DIAGNOSIS — D2271 Melanocytic nevi of right lower limb, including hip: Secondary | ICD-10-CM | POA: Diagnosis not present

## 2021-02-24 DIAGNOSIS — X32XXXA Exposure to sunlight, initial encounter: Secondary | ICD-10-CM | POA: Diagnosis not present

## 2021-02-24 DIAGNOSIS — Z85820 Personal history of malignant melanoma of skin: Secondary | ICD-10-CM | POA: Diagnosis not present

## 2021-02-24 DIAGNOSIS — D2262 Melanocytic nevi of left upper limb, including shoulder: Secondary | ICD-10-CM | POA: Diagnosis not present

## 2021-02-24 DIAGNOSIS — D2261 Melanocytic nevi of right upper limb, including shoulder: Secondary | ICD-10-CM | POA: Diagnosis not present
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
## 2021-03-10 ENCOUNTER — Ambulatory Visit: Payer: Medicare PPO | Admitting: Family Medicine

## 2021-03-10 ENCOUNTER — Other Ambulatory Visit: Payer: Self-pay

## 2021-03-10 ENCOUNTER — Telehealth: Payer: Self-pay

## 2021-03-10 ENCOUNTER — Encounter: Payer: Self-pay | Admitting: Family Medicine

## 2021-03-10 VITALS — BP 140/80 | HR 88 | Temp 98.2°F | Resp 16 | Ht 69.0 in | Wt 218.0 lb

## 2021-03-10 DIAGNOSIS — F339 Major depressive disorder, recurrent, unspecified: Secondary | ICD-10-CM | POA: Diagnosis not present

## 2021-03-10 DIAGNOSIS — E785 Hyperlipidemia, unspecified: Secondary | ICD-10-CM | POA: Diagnosis not present

## 2021-03-10 DIAGNOSIS — K219 Gastro-esophageal reflux disease without esophagitis: Secondary | ICD-10-CM

## 2021-03-10 DIAGNOSIS — N401 Enlarged prostate with lower urinary tract symptoms: Secondary | ICD-10-CM | POA: Diagnosis not present

## 2021-03-10 DIAGNOSIS — E1140 Type 2 diabetes mellitus with diabetic neuropathy, unspecified: Secondary | ICD-10-CM

## 2021-03-10 DIAGNOSIS — R351 Nocturia: Secondary | ICD-10-CM

## 2021-03-10 DIAGNOSIS — H9193 Unspecified hearing loss, bilateral: Secondary | ICD-10-CM | POA: Diagnosis not present

## 2021-03-10 DIAGNOSIS — F411 Generalized anxiety disorder: Secondary | ICD-10-CM | POA: Diagnosis not present

## 2021-03-10 DIAGNOSIS — Z862 Personal history of diseases of the blood and blood-forming organs and certain disorders involving the immune mechanism: Secondary | ICD-10-CM | POA: Diagnosis not present

## 2021-03-10 DIAGNOSIS — E782 Mixed hyperlipidemia: Secondary | ICD-10-CM

## 2021-03-10 DIAGNOSIS — N521 Erectile dysfunction due to diseases classified elsewhere: Secondary | ICD-10-CM

## 2021-03-10 DIAGNOSIS — E1169 Type 2 diabetes mellitus with other specified complication: Secondary | ICD-10-CM | POA: Diagnosis not present

## 2021-03-10 LAB — POCT GLYCOSYLATED HEMOGLOBIN (HGB A1C): Hemoglobin A1C: 6.4 % — AB (ref 4.0–5.6)

## 2021-03-10 MED ORDER — TAMSULOSIN HCL 0.4 MG PO CAPS: 0.4000 mg | ORAL_CAPSULE | Freq: Every day | ORAL | 1 refills | Status: DC

## 2021-03-10 MED ORDER — TADALAFIL 5 MG PO TABS: 5.0000 mg | ORAL_TABLET | Freq: Every day | ORAL | 1 refills | Status: DC | PRN

## 2021-03-10 MED ORDER — AMPHETAMINE-DEXTROAMPHETAMINE 15 MG PO TABS
15.0000 mg | ORAL_TABLET | Freq: Two times a day (BID) | ORAL | 0 refills | Status: DC
Start: 2021-03-10 — End: 2021-06-16

## 2021-03-10 MED ORDER — PREGABALIN 50 MG PO CAPS: 50.0000 mg | ORAL_CAPSULE | Freq: Two times a day (BID) | ORAL | 1 refills | Status: DC

## 2021-03-10 MED ORDER — AMPHETAMINE-DEXTROAMPHETAMINE 15 MG PO TABS: 15.0000 mg | ORAL_TABLET | Freq: Two times a day (BID) | ORAL | 0 refills | Status: DC

## 2021-03-10 MED ORDER — ALPRAZOLAM 1 MG PO TABS: 0.5000 mg | ORAL_TABLET | Freq: Every day | ORAL | 0 refills | Status: DC | PRN

## 2021-03-10 NOTE — Telephone Encounter (Signed)
PA for Tadalafil completed over the phone with [employer]. Ref: [id number].

## 2021-03-11 LAB — COMPLETE METABOLIC PANEL WITH GFR
AG Ratio: 1.9 (calc) (ref 1.0–2.5)
ALT: 15 U/L (ref 9–46)
AST: 18 U/L (ref 10–35)
Albumin: 4.5 g/dL (ref 3.6–5.1)
Alkaline phosphatase (APISO): 112 U/L (ref 35–144)
BUN: 14 mg/dL (ref 7–25)
CO2: 28 mmol/L (ref 20–32)
Calcium: 10 mg/dL (ref 8.6–10.3)
Chloride: 102 mmol/L (ref 98–110)
Creat: 1.1 mg/dL (ref 0.70–1.18)
GFR, Est African American: 78 mL/min/{1.73_m2} (ref >=60)
GFR, Est Non African American: 67 mL/min/{1.73_m2} (ref >=60)
Globulin: 2.4 g/dL (calc) (ref 1.9–3.7)
Glucose, Bld: 91 mg/dL (ref 65–99)
Potassium: 4.9 mmol/L (ref 3.5–5.3)
Sodium: 140 mmol/L (ref 135–146)
Total Bilirubin: 0.4 mg/dL (ref 0.2–1.2)
Total Protein: 6.9 g/dL (ref 6.1–8.1)

## 2021-03-11 LAB — CBC WITH DIFFERENTIAL/PLATELET
Absolute Monocytes: 706 cells/uL (ref 200–950)
Basophils Absolute: 67 cells/uL (ref 0–200)
Basophils Relative: 0.8 %
Eosinophils Absolute: 370 cells/uL (ref 15–500)
Eosinophils Relative: 4.4 %
HCT: 39.9 % (ref 38.5–50.0)
Hemoglobin: 13.1 g/dL — ABNORMAL LOW (ref 13.2–17.1)
Lymphs Abs: 1915 cells/uL (ref 850–3900)
MCH: 26.8 pg — ABNORMAL LOW (ref 27.0–33.0)
MCHC: 32.8 g/dL (ref 32.0–36.0)
MCV: 81.6 fL (ref 80.0–100.0)
MPV: 10.6 fL (ref 7.5–12.5)
Monocytes Relative: 8.4 %
Neutro Abs: 5342 cells/uL (ref 1500–7800)
Neutrophils Relative %: 63.6 %
Platelets: 250 10*3/uL (ref 140–400)
RBC: 4.89 10*6/uL (ref 4.20–5.80)
RDW: 14.5 % (ref 11.0–15.0)
Total Lymphocyte: 22.8 %
WBC: 8.4 10*3/uL (ref 3.8–10.8)

## 2021-03-11 LAB — IRON,TIBC AND FERRITIN PANEL
%SAT: 18 % (calc) — ABNORMAL LOW (ref 20–48)
Ferritin: 28 ng/mL (ref 24–380)
Iron: 61 ug/dL (ref 50–180)
TIBC: 330 mcg/dL (calc) (ref 250–425)

## 2021-03-11 LAB — LIPID PANEL
Cholesterol: 130 mg/dL (ref <200)
HDL: 42 mg/dL (ref >=40)
LDL Cholesterol (Calc): 58 mg/dL (calc)
Non-HDL Cholesterol (Calc): 88 mg/dL (calc) (ref <130)
Total CHOL/HDL Ratio: 3.1 (calc) (ref <5.0)
Triglycerides: 238 mg/dL — ABNORMAL HIGH (ref <150)

## 2021-03-11 LAB — MICROALBUMIN / CREATININE URINE RATIO
Creatinine, Urine: 43 mg/dL (ref 20–320)
Microalb Creat Ratio: 12 mcg/mg creat (ref <30)
Microalb, Ur: 0.5 mg/dL

## 2021-03-23 ENCOUNTER — Other Ambulatory Visit: Payer: Self-pay | Admitting: Family Medicine

## 2021-03-23 DIAGNOSIS — K219 Gastro-esophageal reflux disease without esophagitis: Secondary | ICD-10-CM

## 2021-06-03 ENCOUNTER — Ambulatory Visit (INDEPENDENT_AMBULATORY_CARE_PROVIDER_SITE_OTHER): Payer: Medicare PPO

## 2021-06-03 ENCOUNTER — Ambulatory Visit: Payer: Medicare PPO

## 2021-06-03 DIAGNOSIS — Z0000 Encounter for general adult medical examination without abnormal findings: Secondary | ICD-10-CM

## 2021-06-03 NOTE — Patient Instructions (Signed)
Shane Boyd , Thank you for taking time to come for your Medicare Wellness Visit. I appreciate your ongoing commitment to your health goals. Please review the following plan we discussed and let me know if I can assist you in the future.   Screening recommendations/referrals: Colonoscopy: done 03/31/20. Repeat in 2026.  Recommended yearly ophthalmology/optometry visit for glaucoma screening and checkup Recommended yearly dental visit for hygiene and checkup  Vaccinations: Influenza vaccine: done 09/09/20 Pneumococcal vaccine: done 07/04/17 Tdap vaccine: done 10/04/17 Shingles vaccine: Shingrix discussed. Please contact your pharmacy for coverage information.  Covid-19: done 01/31/20, 02/25/20, 09/02/20 & 03/26/21  Conditions/risks identified: Recommend increasing physical activity to at least 3 days per week   Next appointment: Follow up in one year for your annual wellness visit.   Preventive Care 72 Years and Older, Male Preventive care refers to lifestyle choices and visits with your health care provider that can promote health and wellness. What does preventive care include? A yearly physical exam. This is also called an annual well check. Dental exams once or twice a year. Routine eye exams. Ask your health care provider how often you should have your eyes checked. Personal lifestyle choices, including: Daily care of your teeth and gums. Regular physical activity. Eating a healthy diet. Avoiding tobacco and drug use. Limiting alcohol use. Practicing safe sex. Taking low doses of aspirin every day. Taking vitamin and mineral supplements as recommended by your health care provider. What happens during an annual well check? The services and screenings done by your health care provider during your annual well check will depend on your age, overall health, lifestyle risk factors, and family history of disease. Counseling  Your health care provider may ask you questions about  your: Alcohol use. Tobacco use. Drug use. Emotional well-being. Home and relationship well-being. Sexual activity. Eating habits. History of falls. Memory and ability to understand (cognition). Work and work Statistician. Screening  You may have the following tests or measurements: Height, weight, and BMI. Blood pressure. Lipid and cholesterol levels. These may be checked every 5 years, or more frequently if you are over 58 years old. Skin check. Lung cancer screening. You may have this screening every year starting at age 46 if you have a 30-pack-year history of smoking and currently smoke or have quit within the past 15 years. Fecal occult blood test (FOBT) of the stool. You may have this test every year starting at age 63. Flexible sigmoidoscopy or colonoscopy. You may have a sigmoidoscopy every 5 years or a colonoscopy every 10 years starting at age 76. Prostate cancer screening. Recommendations will vary depending on your family history and other risks. Hepatitis C blood test. Hepatitis B blood test. Sexually transmitted disease (STD) testing. Diabetes screening. This is done by checking your blood sugar (glucose) after you have not eaten for a while (fasting). You may have this done every 1-3 years. Abdominal aortic aneurysm (AAA) screening. You may need this if you are a current or former smoker. Osteoporosis. You may be screened starting at age 70 if you are at high risk. Talk with your health care provider about your test results, treatment options, and if necessary, the need for more tests. Vaccines  Your health care provider may recommend certain vaccines, such as: Influenza vaccine. This is recommended every year. Tetanus, diphtheria, and acellular pertussis (Tdap, Td) vaccine. You may need a Td booster every 10 years. Zoster vaccine. You may need this after age 33. Pneumococcal 13-valent conjugate (PCV13) vaccine. One dose is  recommended after age 66. Pneumococcal  polysaccharide (PPSV23) vaccine. One dose is recommended after age 43. Talk to your health care provider about which screenings and vaccines you need and how often you need them. This information is not intended to replace advice given to you by your health care provider. Make sure you discuss any questions you have with your health care provider. Document Released: 12/18/2015 Document Revised: 08/10/2016 Document Reviewed: 09/22/2015 Elsevier Interactive Patient Education  2017 [place] Prevention in the Home Falls can cause injuries. They can happen to people of all ages. There are many things you can do to make your home safe and to help prevent falls. What can I do on the outside of my home? Regularly fix the edges of walkways and driveways and fix any cracks. Remove anything that might make you trip as you walk through a door, such as a raised step or threshold. Trim any bushes or trees on the path to your home. Use bright outdoor lighting. Clear any walking paths of anything that might make someone trip, such as rocks or tools. Regularly check to see if handrails are loose or broken. Make sure that both sides of any steps have handrails. Any raised decks and porches should have guardrails on the edges. Have any leaves, snow, or ice cleared regularly. Use sand or salt on walking paths during winter. Clean up any spills in your garage right away. This includes oil or grease spills. What can I do in the bathroom? Use night lights. Install grab bars by the toilet and in the tub and shower. Do not use towel bars as grab bars. Use non-skid mats or decals in the tub or shower. If you need to sit down in the shower, use a plastic, non-slip stool. Keep the floor dry. Clean up any water that spills on the floor as soon as it happens. Remove soap buildup in the tub or shower regularly. Attach bath mats securely with double-sided non-slip rug tape. Do not have throw rugs and other  things on the floor that can make you trip. What can I do in the bedroom? Use night lights. Make sure that you have a light by your bed that is easy to reach. Do not use any sheets or blankets that are too big for your bed. They should not hang down onto the floor. Have a firm chair that has side arms. You can use this for support while you get dressed. Do not have throw rugs and other things on the floor that can make you trip. What can I do in the kitchen? Clean up any spills right away. Avoid walking on wet floors. Keep items that you use a lot in easy-to-reach places. If you need to reach something above you, use a strong step stool that has a grab bar. Keep electrical cords out of the way. Do not use floor polish or wax that makes floors slippery. If you must use wax, use non-skid floor wax. Do not have throw rugs and other things on the floor that can make you trip. What can I do with my stairs? Do not leave any items on the stairs. Make sure that there are handrails on both sides of the stairs and use them. Fix handrails that are broken or loose. Make sure that handrails are as long as the stairways. Check any carpeting to make sure that it is firmly attached to the stairs. Fix any carpet that is loose or worn. Avoid having  throw rugs at the top or bottom of the stairs. If you do have throw rugs, attach them to the floor with carpet tape. Make sure that you have a light switch at the top of the stairs and the bottom of the stairs. If you do not have them, ask someone to add them for you. What else can I do to help prevent falls? Wear shoes that: Do not have high heels. Have rubber bottoms. Are comfortable and fit you well. Are closed at the toe. Do not wear sandals. If you use a stepladder: Make sure that it is fully opened. Do not climb a closed stepladder. Make sure that both sides of the stepladder are locked into place. Ask someone to hold it for you, if possible. Clearly  mark and make sure that you can see: Any grab bars or handrails. First and last steps. Where the edge of each step is. Use tools that help you move around (mobility aids) if they are needed. These include: Canes. Walkers. Scooters. Crutches. Turn on the lights when you go into a dark area. Replace any light bulbs as soon as they burn out. Set up your furniture so you have a clear path. Avoid moving your furniture around. If any of your floors are uneven, fix them. If there are any pets around you, be aware of where they are. Review your medicines with your doctor. Some medicines can make you feel dizzy. This can increase your chance of falling. Ask your doctor what other things that you can do to help prevent falls. This information is not intended to replace advice given to you by your health care provider. Make sure you discuss any questions you have with your health care provider. Document Released: 09/17/2009 Document Revised: 04/28/2016 Document Reviewed: 12/26/2014 Elsevier Interactive Patient Education  2017 [employer].
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
## 2021-06-16 ENCOUNTER — Ambulatory Visit (INDEPENDENT_AMBULATORY_CARE_PROVIDER_SITE_OTHER): Payer: Medicare PPO | Admitting: Family Medicine

## 2021-06-16 ENCOUNTER — Other Ambulatory Visit: Payer: Self-pay

## 2021-06-16 ENCOUNTER — Encounter: Payer: Self-pay | Admitting: Family Medicine

## 2021-06-16 VITALS — BP 138/84 | HR 81 | Temp 98.0°F | Resp 16 | Ht 69.0 in | Wt 222.0 lb

## 2021-06-16 DIAGNOSIS — A6000 Herpesviral infection of urogenital system, unspecified: Secondary | ICD-10-CM

## 2021-06-16 DIAGNOSIS — Z23 Encounter for immunization: Secondary | ICD-10-CM | POA: Diagnosis not present

## 2021-06-16 DIAGNOSIS — F411 Generalized anxiety disorder: Secondary | ICD-10-CM | POA: Diagnosis not present

## 2021-06-16 DIAGNOSIS — E1169 Type 2 diabetes mellitus with other specified complication: Secondary | ICD-10-CM

## 2021-06-16 DIAGNOSIS — E782 Mixed hyperlipidemia: Secondary | ICD-10-CM

## 2021-06-16 DIAGNOSIS — F339 Major depressive disorder, recurrent, unspecified: Secondary | ICD-10-CM

## 2021-06-16 DIAGNOSIS — D509 Iron deficiency anemia, unspecified: Secondary | ICD-10-CM | POA: Diagnosis not present

## 2021-06-16 DIAGNOSIS — H9193 Unspecified hearing loss, bilateral: Secondary | ICD-10-CM

## 2021-06-16 DIAGNOSIS — E785 Hyperlipidemia, unspecified: Secondary | ICD-10-CM | POA: Diagnosis not present

## 2021-06-16 DIAGNOSIS — E1140 Type 2 diabetes mellitus with diabetic neuropathy, unspecified: Secondary | ICD-10-CM

## 2021-06-16 DIAGNOSIS — N521 Erectile dysfunction due to diseases classified elsewhere: Secondary | ICD-10-CM

## 2021-06-16 LAB — POCT GLYCOSYLATED HEMOGLOBIN (HGB A1C): Hemoglobin A1C: 6.3 % — AB (ref 4.0–5.6)

## 2021-06-16 MED ORDER — AMPHETAMINE-DEXTROAMPHETAMINE 15 MG PO TABS: 15.0000 mg | ORAL_TABLET | Freq: Two times a day (BID) | ORAL | 0 refills | Status: DC

## 2021-06-16 MED ORDER — ATORVASTATIN CALCIUM 80 MG PO TABS: 80.0000 mg | ORAL_TABLET | Freq: Every day | ORAL | 1 refills | Status: DC

## 2021-06-16 MED ORDER — SHINGRIX 50 MCG/0.5ML IM SUSR: 0.5000 mL | Freq: Once | INTRAMUSCULAR | 1 refills | Status: AC

## 2021-06-16 MED ORDER — VALACYCLOVIR HCL 500 MG PO TABS: ORAL_TABLET | ORAL | 1 refills | Status: DC

## 2021-06-16 MED ORDER — BUPROPION HCL ER (XL) 300 MG PO TB24: 300.0000 mg | ORAL_TABLET | Freq: Every day | ORAL | 1 refills | Status: DC

## 2021-06-16 MED ORDER — METFORMIN HCL ER 750 MG PO TB24: ORAL_TABLET | ORAL | 1 refills | Status: DC

## 2021-06-16 MED ORDER — OMEGA-3-ACID ETHYL ESTERS 1 G PO CAPS: 2.0000 g | ORAL_CAPSULE | Freq: Two times a day (BID) | ORAL | 1 refills | Status: DC

## 2021-06-16 MED ORDER — ALPRAZOLAM 1 MG PO TABS: 0.5000 mg | ORAL_TABLET | Freq: Every day | ORAL | 0 refills | Status: DC | PRN

## 2021-06-17 ENCOUNTER — Other Ambulatory Visit: Payer: Self-pay | Admitting: Family Medicine

## 2021-06-17 DIAGNOSIS — K219 Gastro-esophageal reflux disease without esophagitis: Secondary | ICD-10-CM

## 2021-06-17 LAB — CBC WITH DIFFERENTIAL/PLATELET
Absolute Monocytes: 502 cells/uL (ref 200–950)
Basophils Absolute: 30 cells/uL (ref 0–200)
Basophils Relative: 0.4 %
Eosinophils Absolute: 160 cells/uL (ref 15–500)
Eosinophils Relative: 2.1 %
HCT: 39 % (ref 38.5–50.0)
Hemoglobin: 12.9 g/dL — ABNORMAL LOW (ref 13.2–17.1)
Lymphs Abs: 2409 cells/uL (ref 850–3900)
MCH: 26.7 pg — ABNORMAL LOW (ref 27.0–33.0)
MCHC: 33.1 g/dL (ref 32.0–36.0)
MCV: 80.6 fL (ref 80.0–100.0)
MPV: 11.1 fL (ref 7.5–12.5)
Monocytes Relative: 6.6 %
Neutro Abs: 4499 cells/uL (ref 1500–7800)
Neutrophils Relative %: 59.2 %
Platelets: 174 10*3/uL (ref 140–400)
RBC: 4.84 10*6/uL (ref 4.20–5.80)
RDW: 16 % — ABNORMAL HIGH (ref 11.0–15.0)
Total Lymphocyte: 31.7 %
WBC: 7.6 10*3/uL (ref 3.8–10.8)

## 2021-06-17 LAB — IRON,TIBC AND FERRITIN PANEL
%SAT: 19 % (calc) — ABNORMAL LOW (ref 20–48)
Ferritin: 26 ng/mL (ref 24–380)
Iron: 56 ug/dL (ref 50–180)
TIBC: 291 mcg/dL (calc) (ref 250–425)

## 2021-06-24 ENCOUNTER — Other Ambulatory Visit: Payer: Self-pay | Admitting: Family Medicine

## 2021-06-24 DIAGNOSIS — K219 Gastro-esophageal reflux disease without esophagitis: Secondary | ICD-10-CM

## 2021-07-24 ENCOUNTER — Other Ambulatory Visit: Payer: Self-pay | Admitting: Family Medicine

## 2021-07-24 DIAGNOSIS — K219 Gastro-esophageal reflux disease without esophagitis: Secondary | ICD-10-CM

## 2021-07-24 NOTE — Telephone Encounter (Signed)
Villanue[place]- Pt picked up 90 day RF 06/17/21

## 2021-08-03 DIAGNOSIS — H43391 Other vitreous opacities, right eye: Secondary | ICD-10-CM | POA: Diagnosis not present

## 2021-08-03 DIAGNOSIS — E119 Type 2 diabetes mellitus without complications: Secondary | ICD-10-CM | POA: Diagnosis not present

## 2021-08-03 DIAGNOSIS — H2513 Age-related nuclear cataract, bilateral: Secondary | ICD-10-CM | POA: Diagnosis not present

## 2021-08-03 LAB — HM DIABETES EYE EXAM

## 2021-08-08 ENCOUNTER — Other Ambulatory Visit: Payer: Self-pay | Admitting: Family Medicine

## 2021-08-08 DIAGNOSIS — R1032 Left lower quadrant pain: Secondary | ICD-10-CM

## 2021-08-08 DIAGNOSIS — R1031 Right lower quadrant pain: Secondary | ICD-10-CM

## 2021-08-21 ENCOUNTER — Other Ambulatory Visit: Payer: Self-pay | Admitting: Family Medicine

## 2021-08-21 DIAGNOSIS — K219 Gastro-esophageal reflux disease without esophagitis: Secondary | ICD-10-CM

## 2021-08-22 NOTE — Telephone Encounter (Signed)
last RF 06/17/21 #90 too soon pt should have enough to last until upcoming OV

## 2021-08-24 DIAGNOSIS — D2271 Melanocytic nevi of right lower limb, including hip: Secondary | ICD-10-CM | POA: Diagnosis not present

## 2021-08-24 DIAGNOSIS — M71342 Other bursal cyst, left hand: Secondary | ICD-10-CM | POA: Diagnosis not present

## 2021-08-24 DIAGNOSIS — Z85828 Personal history of other malignant neoplasm of skin: Secondary | ICD-10-CM | POA: Diagnosis not present

## 2021-08-24 DIAGNOSIS — L570 Actinic keratosis: Secondary | ICD-10-CM | POA: Diagnosis not present

## 2021-08-24 DIAGNOSIS — Z85820 Personal history of malignant melanoma of skin: Secondary | ICD-10-CM | POA: Diagnosis not present

## 2021-08-24 DIAGNOSIS — X32XXXA Exposure to sunlight, initial encounter: Secondary | ICD-10-CM | POA: Diagnosis not present

## 2021-08-24 DIAGNOSIS — D2261 Melanocytic nevi of right upper limb, including shoulder: Secondary | ICD-10-CM | POA: Diagnosis not present

## 2021-08-29 ENCOUNTER — Emergency Department
Admission: EM | Admit: 2021-08-29 | Discharge: 2021-08-30 | Disposition: A | Discharge status: Home / Self Care | Payer: Medicare PPO | Attending: Emergency Medicine | Admitting: Emergency Medicine

## 2021-08-29 ENCOUNTER — Other Ambulatory Visit: Payer: Self-pay

## 2021-08-29 DIAGNOSIS — K5792 Diverticulitis of intestine, part unspecified, without perforation or abscess without bleeding: Secondary | ICD-10-CM | POA: Diagnosis not present

## 2021-08-29 DIAGNOSIS — D72829 Elevated white blood cell count, unspecified: Secondary | ICD-10-CM | POA: Insufficient documentation

## 2021-08-29 DIAGNOSIS — R109 Unspecified abdominal pain: Secondary | ICD-10-CM | POA: Diagnosis present

## 2021-08-29 DIAGNOSIS — Z7984 Long term (current) use of oral hypoglycemic drugs: Secondary | ICD-10-CM | POA: Diagnosis not present

## 2021-08-29 DIAGNOSIS — N200 Calculus of kidney: Secondary | ICD-10-CM | POA: Diagnosis not present

## 2021-08-29 DIAGNOSIS — Z85038 Personal history of other malignant neoplasm of large intestine: Secondary | ICD-10-CM | POA: Insufficient documentation

## 2021-08-29 DIAGNOSIS — K5730 Diverticulosis of large intestine without perforation or abscess without bleeding: Secondary | ICD-10-CM | POA: Diagnosis not present

## 2021-08-29 DIAGNOSIS — K5641 Fecal impaction: Secondary | ICD-10-CM | POA: Diagnosis not present

## 2021-08-29 DIAGNOSIS — Z85828 Personal history of other malignant neoplasm of skin: Secondary | ICD-10-CM | POA: Insufficient documentation

## 2021-08-29 DIAGNOSIS — K861 Other chronic pancreatitis: Secondary | ICD-10-CM | POA: Diagnosis not present

## 2021-08-29 DIAGNOSIS — Z79899 Other long term (current) drug therapy: Secondary | ICD-10-CM | POA: Insufficient documentation

## 2021-08-29 DIAGNOSIS — E1140 Type 2 diabetes mellitus with diabetic neuropathy, unspecified: Secondary | ICD-10-CM | POA: Diagnosis not present

## 2021-08-29 LAB — COMPREHENSIVE METABOLIC PANEL
ALT: 13 U/L (ref 0–44)
AST: 20 U/L (ref 15–41)
Albumin: 4.4 g/dL (ref 3.5–5.0)
Alkaline Phosphatase: 77 U/L (ref 38–126)
Anion gap: 13 (ref 5–15)
BUN: 15 mg/dL (ref 8–23)
CO2: 24 mmol/L (ref 22–32)
Calcium: 9.2 mg/dL (ref 8.9–10.3)
Chloride: 101 mmol/L (ref 98–111)
Creatinine, Ser: 1.06 mg/dL (ref 0.61–1.24)
GFR, Estimated: >60 mL/min (ref >60)
Glucose, Bld: 151 mg/dL — ABNORMAL HIGH (ref 70–99)
Potassium: 4.5 mmol/L (ref 3.5–5.1)
Sodium: 138 mmol/L (ref 135–145)
Total Bilirubin: 0.9 mg/dL (ref 0.3–1.2)
Total Protein: 7.1 g/dL (ref 6.5–8.1)

## 2021-08-29 LAB — URINALYSIS, COMPLETE (UACMP) WITH MICROSCOPIC
Bilirubin Urine: NEGATIVE
Glucose, UA: NEGATIVE mg/dL
Hgb urine dipstick: NEGATIVE
Ketones, ur: 5 mg/dL — AB
Leukocytes,Ua: NEGATIVE
Nitrite: NEGATIVE
Protein, ur: NEGATIVE mg/dL
Specific Gravity, Urine: 1.011 (ref 1.005–1.030)
Squamous Epithelial / HPF: NONE SEEN (ref 0–5)
pH: 5 (ref 5.0–8.0)

## 2021-08-29 LAB — CBC
HCT: 39.9 % (ref 39.0–52.0)
Hemoglobin: 13.4 g/dL (ref 13.0–17.0)
MCH: 28.4 pg (ref 26.0–34.0)
MCHC: 33.6 g/dL (ref 30.0–36.0)
MCV: 84.5 fL (ref 80.0–100.0)
Platelets: 181 10*3/uL (ref 150–400)
RBC: 4.72 MIL/uL (ref 4.22–5.81)
RDW: 15.1 % (ref 11.5–15.5)
WBC: 11.4 10*3/uL — ABNORMAL HIGH (ref 4.0–10.5)
nRBC: 0 % (ref 0.0–0.2)

## 2021-08-29 LAB — LIPASE, BLOOD: Lipase: 21 U/L (ref 11–51)

## 2021-08-29 MED ORDER — MORPHINE SULFATE (PF) 4 MG/ML IV SOLN
4.0000 mg | Freq: Once | INTRAVENOUS | Status: AC
Start: 2021-08-29 — End: 2021-08-29
  Administered 2021-08-29: 4 mg via INTRAVENOUS
  Filled 2021-08-29: qty 1

## 2021-08-29 MED ORDER — KETOROLAC TROMETHAMINE 30 MG/ML IJ SOLN
15.0000 mg | Freq: Once | INTRAMUSCULAR | Status: AC
  Administered 2021-08-29: 15 mg via INTRAVENOUS
  Filled 2021-08-29: qty 1

## 2021-08-29 MED ORDER — ONDANSETRON HCL 4 MG/2ML IJ SOLN
4.0000 mg | INTRAMUSCULAR | Status: AC
  Administered 2021-08-29: 4 mg via INTRAVENOUS
  Filled 2021-08-29: qty 2

## 2021-08-29 NOTE — ED Triage Notes (Signed)
Patient reports having left lower quad pain.  States feels similar to previous kidney stone.

## 2021-08-29 NOTE — ED Provider Notes (Signed)
[facility] Emergency Department Provider Note  ____________________________________________   Event Date/Time   First MD Initiated Contact with Patient 08/29/21 2312     (approximate)  I have reviewed the triage vital signs and the nursing notes.   HISTORY  Chief Complaint Abdominal Pain    HPI Shane Boyd. is a 70 y.o. male   with medical history as listed below who which notably includes 1 prior episode of kidney stone associated pain which resolved on its own without the need for surgical intervention.  He presents for a cute onset and severe suprapubic pain that is sharp and aching and has been going on now about 5 hours.  He said it was sudden in onset and nothing in particular makes it better or worse, but it waxes and wanes in severity.  At its best it is mild and aching and currently it is sharp and stabbing.  He said it feels like his last stone felt.  He has had nausea but no vomiting.  He denies fever, sore throat, chest pain, shortness of breath.  His wife says that yesterday he seemed ill with subjective fever, chills, and nausea, but he had no pain at that time.  Those symptoms resolved all during the day today but do started back up at about 6:30 PM.  However now he is not having the subjective fevers nor chills.  He does not take blood thinners.        Past Medical History:  Diagnosis Date   Anemia    Anxiety    Chronic insomnia    Constipation    Depression    Diabetes mellitus without complication ([place])    Encounter for long-term (current) use of other high-risk medications    GERD (gastroesophageal reflux disease)    History of malignant melanoma    Dr. 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 ([place]) 10/17/2018   Right ureteral stone 08/15/2018   Bilateral inguinal hernia without obstruction or gangrene 08/13/2018   Fatty liver 08/13/2018   Difficulty balancing 05/16/2017   Special screening for malignant neoplasms, colon    Benign neoplasm of ascending colon    Benign neoplasm of transverse colon    Hearing loss, sensorineural, unilateral 10/21/2016   Dyslipidemia with low high density lipoprotein (HDL) cholesterol with hypertriglyceridemia due to type 2 diabetes mellitus ([place]) 09/16/2016   GAD (generalized anxiety disorder) 09/16/2016   Genital herpes 06/08/2016   Dizziness 03/09/2016   History of nonmelanoma skin cancer 09/30/2015   Neck pain 07/29/2015   Anxiety 07/19/2015   Insomnia, persistent 07/19/2015   CN (constipation) 07/19/2015   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 ([place]) 07/19/2015   Dyslipidemia 07/19/2015   Gastric reflux 07/19/2015   History of shingles 07/19/2015   History of basal cell cancer 07/19/2015   H/O Malignant melanoma 07/19/2015   Personal history of malignant neoplasm of head and neck 07/19/2015   Chronic recurrent major depressive disorder ([place]) 07/19/2015   Obesity (BMI 30.0-34.9) 07/19/2015   ED (erectile dysfunction) 07/19/2015  Past Surgical History:  Procedure Laterality Date   ARM SKIN LESION BIOPSY / EXCISION Right 05/18/2017   COLONOSCOPY     COLONOSCOPY WITH PROPOFOL N/A 12/15/2016   Procedure: COLONOSCOPY WITH PROPOFOL;  Surgeon: Lucilla Lame, MD;  Location: [place];  Service: Endoscopy;  Laterality: N/A;  diabetic - oral meds   COLONOSCOPY WITH PROPOFOL N/A 03/31/2020   Procedure: COLONOSCOPY WITH PROPOFOL;  Surgeon: Lucilla Lame, MD;  Location: [facility] ENDOSCOPY;  Service: Endoscopy;  Laterality: N/A;   ESOPHAGOGASTRODUODENOSCOPY (EGD) WITH PROPOFOL N/A 03/31/2020   Procedure:  ESOPHAGOGASTRODUODENOSCOPY (EGD) WITH PROPOFOL;  Surgeon: Lucilla Lame, MD;  Location: [facility] ENDOSCOPY;  Service: Endoscopy;  Laterality: N/A;   NASAL SEPTUM SURGERY     POLYPECTOMY  12/15/2016   Procedure: POLYPECTOMY;  Surgeon: Lucilla Lame, MD;  Location: [place];  Service: Endoscopy;;   SENTINEL NODE BIOPSY Right 05/18/2017   [facility]    Prior to Admission medications   Medication Sig Start Date End Date Taking? Authorizing Provider  amoxicillin-clavulanate (AUGMENTIN) 875-125 MG tablet Take 1 tablet by mouth every 8 (eight) hours for 10 days. 08/30/21 09/09/21 Yes Hinda Kehr, MD  docusate sodium (COLACE) 100 MG capsule Take 1 tablet once or twice daily as needed for constipation while taking narcotic pain medicine 08/30/21  Yes Hinda Kehr, MD  HYDROcodone-acetaminophen (NORCO/VICODIN) 5-325 MG tablet Take 2 tablets by mouth every 6 (six) hours as needed for moderate pain or severe pain. 08/30/21  Yes Hinda Kehr, MD  ondansetron (ZOFRAN ODT) 4 MG disintegrating tablet Allow 1-2 tablets to dissolve in your mouth every 8 hours as needed for nausea/vomiting 08/30/21  Yes Hinda Kehr, MD  ALPRAZolam Duanne Moron) 1 MG tablet Take 0.5-1 tablets (0.5-1 mg total) by mouth daily as needed for anxiety. 06/16/21   Steele Sizer, MD  amphetamine-dextroamphetamine (ADDERALL) 15 MG tablet Take 1 tablet by mouth 2 (two) times daily. 06/16/21   Steele Sizer, MD  amphetamine-dextroamphetamine (ADDERALL) 15 MG tablet Take 1 tablet by mouth 2 (two) times daily. 06/16/21   Steele Sizer, MD  amphetamine-dextroamphetamine (ADDERALL) 15 MG tablet Take 1 tablet by mouth 2 (two) times daily. 06/16/21   Steele Sizer, MD  atorvastatin (LIPITOR) 80 MG tablet Take 1 tablet (80 mg total) by mouth daily. 06/16/21   Steele Sizer, MD  buPROPion (WELLBUTRIN XL) 300 MG 24 hr tablet Take 1 tablet (300 mg total) by mouth daily. 06/16/21   Steele Sizer, MD  cholecalciferol (VITAMIN D3) 25 MCG (1000 UNIT) tablet  Take 1,000 Units by mouth daily.    [provider]  famotidine (PEPCID) 20 MG tablet Take 1 tablet by mouth once daily 06/24/21   Steele Sizer, MD  ferrous sulfate 325 (65 FE) MG EC tablet Take 1 tablet (325 mg total) by mouth 2 (two) times daily. 10/25/19   Steele Sizer, MD  meloxicam (MOBIC) 15 MG tablet TAKE 1 TABLET BY MOUTH ONCE DAILY AS NEEDED FOR PAIN. TRY NOT TO TAKE DAILY. 08/09/21   Ancil Boozer, Drue Stager, MD  metFORMIN (GLUCOPHAGE-XR) 750 MG 24 hr tablet TAKE 2 TABLETS BY MOUTH ONCE DAILY WITH BREAKFAST 06/16/21   Steele Sizer, MD  omega-3 acid ethyl esters (LOVAZA) 1 g capsule Take 2 capsules (2 g total) by mouth 2 (two) times daily. 06/16/21   Steele Sizer, MD  omeprazole (PRILOSEC) 40 MG capsule Take 1 capsule by mouth once daily 06/17/21   Steele Sizer, MD  pregabalin (LYRICA) 50 MG capsule Take 1 capsule (50 mg total) by mouth 2 (two) times daily. 03/10/21  Steele Sizer, MD  tadalafil (CIALIS) 5 MG tablet Take 1 tablet (5 mg total) by mouth daily as needed for erectile dysfunction. 03/10/21   Steele Sizer, MD  tamsulosin (FLOMAX) 0.4 MG CAPS capsule Take 1 capsule (0.4 mg total) by mouth daily. 03/10/21   Steele Sizer, MD  valACYclovir (VALTREX) 500 MG tablet TAKE ONE CAPLET BY MOUTH THREE TIMES DAILY 06/16/21   Steele Sizer, MD    Allergies Patient has no known allergies.  Family History  Problem Relation Age of Onset   Diabetes Mother    Heart disease Mother    Hyperlipidemia Mother    CVA Father    Cancer Father        Colon   Depression Father     Social History Social History   Tobacco Use   Smoking status: Never   Smokeless tobacco: Never   Tobacco comments:    Smoking cessation materials not required  Vaping Use   Vaping Use: Never used  Substance Use Topics   Alcohol use: Yes    Alcohol/week: 0.0 standard drinks    Comment: Holidays   Drug use: No    Review of Systems Constitutional: Subjective fever and chills yesterday but none  today. Eyes: No visual changes. ENT: No sore throat. Cardiovascular: Denies chest pain. Respiratory: Denies shortness of breath. Gastrointestinal: Suprapubic abdominal pain with nausea as described above.  No vomiting and no diarrhea. Genitourinary: Negative for dysuria.  Negative for hematuria. Musculoskeletal: Negative for neck pain.  Negative for back pain. Integumentary: Negative for rash. Neurological: Negative for headaches, focal weakness or numbness.   ____________________________________________   PHYSICAL EXAM:  VITAL SIGNS: ED Triage Vitals  Enc Vitals Group     BP 08/29/21 2128 (!) 165/94     Pulse Rate 08/29/21 2128 69     Resp 08/29/21 2128 18     Temp 08/29/21 2128 98.4 F (36.9 C)     Temp Source 08/29/21 2128 Oral     SpO2 08/29/21 2128 95 %     Weight 08/29/21 2129 95.3 kg (210 lb)     Height 08/29/21 2129 1.753 m (5\' 9" )     Head Circumference --      Peak Flow --      Pain Score 08/29/21 2128 7     Pain Loc --      Pain Edu? --      Excl. in [place]? --     Constitutional: Alert and oriented.  Eyes: Conjunctivae are normal.  Head: Atraumatic. Nose: No congestion/rhinnorhea. Mouth/Throat: Patient is wearing a mask. Neck: No stridor.  No meningeal signs.   Cardiovascular: Normal rate, regular rhythm. Good peripheral circulation. Respiratory: Normal respiratory effort.  No retractions. Gastrointestinal: Soft and nondistended.  Tender to palpation in the periumbilical region and to the suprapubic region but without rebound or guarding.  No peritonitis. Musculoskeletal: No left nor right sided flank tenderness to percussion. No gross deformities of extremities. Neurologic:  Normal speech and language. No gross focal neurologic deficits are appreciated.  Skin:  Skin is warm, dry and intact. Psychiatric: Mood and affect are normal. Speech and behavior are normal.  ____________________________________________   LABS (all labs ordered are listed, but only  abnormal results are displayed)  Labs Reviewed  COMPREHENSIVE METABOLIC PANEL - Abnormal; Notable for the following components:      Result Value   Glucose, Bld 151 (*)    All other components within normal limits  CBC - Abnormal; Notable for the following components:  WBC 11.4 (*)    All other components within normal limits  URINALYSIS, COMPLETE (UACMP) WITH MICROSCOPIC - Abnormal; Notable for the following components:   Color, Urine YELLOW (*)    APPearance CLEAR (*)    Ketones, ur 5 (*)    Bacteria, UA RARE (*)    All other components within normal limits  LIPASE, BLOOD   ____________________________________________   RADIOLOGY I, Hinda Kehr, personally viewed and evaluated these images (plain radiographs) as part of my medical decision making, as well as reviewing the written report by the radiologist.  ED MD interpretation: Acute uncomplicated diverticulitis.  Question of pneumonia as well.  Official radiology report(s): CT Renal Stone Study  Result Date: 08/30/2021 CLINICAL DATA:  Left lower quadrant abdominal pain, flank pain EXAM: CT ABDOMEN AND PELVIS WITHOUT CONTRAST TECHNIQUE: Multidetector CT imaging of the abdomen and pelvis was performed following the standard protocol without IV contrast. COMPARISON:  08/12/2018 FINDINGS: Lower chest: There is patchy ground-glass pulmonary infiltrate noted within the a left upper lobe, incompletely visualized on this examination, likely infectious or inflammatory in nature. Sparse infiltrate is also seen within the left lower lobe. Mild bibasilar atelectasis. Extensive multi-vessel coronary artery calcification. Global cardiac size within normal limits. Hepatobiliary: No focal liver abnormality is seen. No gallstones, gallbladder wall thickening, or biliary dilatation. Pancreas: Punctate calcifications are seen within the pancreatic head and uncinate process in keeping with changes of chronic pancreatitis, unchanged from prior  examination. No superimposed acute peripancreatic inflammatory change. The pancreatic duct is not dilated. No peripancreatic fluid collections identified. Spleen: Unremarkable Adrenals/Urinary Tract: The adrenal glands are unremarkable. The kidneys are normal in size and position. Stable mild nonspecific perinephric stranding. No perinephric fluid collections are identified. 2 mm nonobstructing calculus is seen within the upper pole of the right kidney. No additional nephro or urolithiasis. No hydronephrosis. The bladder is unremarkable. Stomach/Bowel: Moderate stool seen within the rectal vault. Very mild pericolonic inflammatory stranding is seen involving the proximal sigmoid colon most in keeping with changes of acute, mild sigmoid diverticulitis. There is background moderate to severe sigmoid and descending colonic diverticulosis. No evidence of obstruction. No free intraperitoneal gas. No free or loculated intra-abdominal fluid. The stomach, small bowel, and large bowel are otherwise unremarkable. Appendix normal. Vascular/Lymphatic: Mild aortoiliac atherosclerotic calcification. No aortic aneurysm. No pathologic adenopathy within the abdomen and pelvis. Reproductive: Prostate is unremarkable. Other: Small bilateral fat containing inguinal hernias are present. Tiny fat containing umbilical hernia. Musculoskeletal: Degenerative changes are seen within the hips and lumbar spine. No acute bone abnormality. No lytic or blastic bone lesion. IMPRESSION: Mild pericolonic inflammatory stranding involving the proximal sigmoid colon in keeping with acute, mild, uncomplicated sigmoid diverticulitis. Patchy pulmonary infiltrate within the visualized left upper lobe, likely infectious in the acute setting. This is incompletely evaluated on this examination. Stable changes of chronic pancreatitis Minimal right nonobstructing nephrolithiasis. No urolithiasis. No hydronephrosis. Moderate stool within the rectal vault.  Clinical correlation for fecal impaction may be helpful. No evidence of obstruction. Aortic Atherosclerosis (ICD10-I70.0). Electronically Signed   By: Fidela Salisbury M.D.   On: 08/30/2021 01:06    ____________________________________________   PROCEDURES   Procedure(s) performed (including Critical Care):  Procedures   ____________________________________________   INITIAL IMPRESSION / MDM / [place] / ED COURSE  As part of my medical decision making, I reviewed the following data within the [place] History obtained from family, Nursing notes reviewed and incorporated, Labs reviewed , Old chart reviewed, Notes from prior  ED visits, and [place] Controlled Substance Database   Differential diagnosis includes, but is not limited to, renal/ureteral colic, diverticulitis, appendicitis, epiploic appendagitis.  Patient's vital signs are stable and within normal limits.  He has a very mild leukocytosis of just over 11.  His exam is not consistent with renal colic and he has tenderness to palpation though without peritonitis of his lower abdomen.  However he says this is the way he presented the last time he had a kidney stone.  His urinalysis is unremarkable including no hematuria.  Comprehensive metabolic panel is normal.  I am treating with morphine 4 mg IV, Toradol 15 mg IV, and Zofran 4 mg IV.  We will obtain a CT renal stone protocol which should give [place] a good look at his intestines as well as looking for any evidence of kidney stone.  Patient and his wife understand and agree with the plan.       Clinical Course as of 08/30/21 0138  Mon Aug 30, 2021  0118 CT Renal Laren Everts CT scan does not demonstrate a stone, but rather uncomplicated diverticulitis.  There is also question of pneumonia, but he is not having any respiratory symptoms.  I will discussed with the patient but likely treat with high-dose Augmentin (3 times a day) for diverticulitis coverage.  [CF]  0126 Patient and wife understand and agree with the plan.  He has no known allergies so we will treat with Augmentin.  First dose in ED. I talked with him about the finding on the CT scan suggestive of a pneumonia, but he confirmed that he has had absolutely no respiratory symptoms.  I listen to him and he has no wheezes, rales, nor rhonchi upon stethoscope auscultation.  I will hold off on providing any additional antibiotic coverage since he will be on high-dose Augmentin for the diverticulitis and it is very questionable and clinically he is not having issues with pneumonia.  Patient and his wife understand and agree with the plan for discharge and outpatient follow-up.  I gave my usual and customary return precautions. [CF]    Clinical Course User Index [CF] Hinda Kehr, MD     ____________________________________________  FINAL CLINICAL IMPRESSION(S) / ED DIAGNOSES  Final diagnoses:  Acute diverticulitis     MEDICATIONS GIVEN DURING THIS VISIT:  Medications  amoxicillin-clavulanate (AUGMENTIN) 875-125 MG per tablet 1 tablet (has no administration in time range)  HYDROcodone-acetaminophen (NORCO/VICODIN) 5-325 MG per tablet 2 tablet (has no administration in time range)  morphine 4 MG/ML injection 4 mg (4 mg Intravenous Given 08/29/21 2349)  ondansetron (ZOFRAN) injection 4 mg (4 mg Intravenous Given 08/29/21 2350)  ketorolac (TORADOL) 30 MG/ML injection 15 mg (15 mg Intravenous Given 08/29/21 2350)     ED Discharge Orders          Ordered    amoxicillin-clavulanate (AUGMENTIN) 875-125 MG tablet  Every 8 hours        08/30/21 0135    HYDROcodone-acetaminophen (NORCO/VICODIN) 5-325 MG tablet  Every 6 hours PRN        08/30/21 0135    ondansetron (ZOFRAN ODT) 4 MG disintegrating tablet        08/30/21 0135    docusate sodium (COLACE) 100 MG capsule        08/30/21 0135             Note:  This document was prepared using Dragon voice recognition software and may  include unintentional dictation errors.   Hinda Kehr, MD 08/30/21  0138  

## 2021-08-30 ENCOUNTER — Emergency Department: Payer: Medicare PPO

## 2021-08-30 DIAGNOSIS — N200 Calculus of kidney: Secondary | ICD-10-CM | POA: Diagnosis not present

## 2021-08-30 DIAGNOSIS — K5730 Diverticulosis of large intestine without perforation or abscess without bleeding: Secondary | ICD-10-CM | POA: Diagnosis not present

## 2021-08-30 DIAGNOSIS — K861 Other chronic pancreatitis: Secondary | ICD-10-CM | POA: Diagnosis not present

## 2021-08-30 DIAGNOSIS — K5641 Fecal impaction: Secondary | ICD-10-CM | POA: Diagnosis not present

## 2021-08-30 MED ORDER — ONDANSETRON 4 MG PO TBDP
ORAL_TABLET | ORAL | 0 refills | Status: DC
Start: 2021-08-30 — End: 2021-09-16

## 2021-08-30 MED ORDER — DOCUSATE SODIUM 100 MG PO CAPS: ORAL_CAPSULE | ORAL | 0 refills | Status: AC

## 2021-08-30 MED ORDER — AMOXICILLIN-POT CLAVULANATE 875-125 MG PO TABS
1.0000 | ORAL_TABLET | Freq: Once | ORAL | Status: AC
  Administered 2021-08-30: 1 via ORAL
  Filled 2021-08-30: qty 1

## 2021-08-30 MED ORDER — HYDROCODONE-ACETAMINOPHEN 5-325 MG PO TABS
2.0000 | ORAL_TABLET | Freq: Once | ORAL | Status: AC
  Administered 2021-08-30: 2 via ORAL
  Filled 2021-08-30: qty 2

## 2021-08-30 MED ORDER — AMOXICILLIN-POT CLAVULANATE 875-125 MG PO TABS: 1.0000 | ORAL_TABLET | Freq: Three times a day (TID) | ORAL | 0 refills | Status: AC

## 2021-08-30 MED ORDER — HYDROCODONE-ACETAMINOPHEN 5-325 MG PO TABS: 2.0000 | ORAL_TABLET | Freq: Four times a day (QID) | ORAL | 0 refills | Status: DC | PRN

## 2021-08-30 NOTE — ED Notes (Signed)
Per MD, will wait 15 min for dispo to assess for abx interaction.

## 2021-08-30 NOTE — Discharge Instructions (Addendum)
We believe your symptoms are caused by diverticulitis.  Most of the time this condition (please read through the included information) can be cured with outpatient antibiotics.  Please take the full course of prescribed medication(s) and follow up with the doctors recommended above.  Return to the ED if your abdominal pain worsens or fails to improve, you develop bloody vomiting, bloody diarrhea, you are unable to tolerate fluids due to vomiting, fever greater than 101, or other symptoms that concern you.  Take Norco as prescribed for severe pain. Do not drink alcohol, drive or participate in any other potentially dangerous activities while taking this medication as it may make you sleepy. Do not take this medication with any other sedating medications, either prescription or over-the-counter. If you were prescribed Percocet or Vicodin, do not take these with acetaminophen (Tylenol) as it is already contained within these medications.   This medication is an opiate (or narcotic) pain medication and can be habit forming.  Use it as little as possible to achieve adequate pain control.  Do not use or use it with extreme caution if you have a history of opiate abuse or dependence.  If you are on a pain contract with your primary care doctor or a pain specialist, be sure to let them know you were prescribed this medication today from the [facility].  This medication is intended for your use only - do not give any to anyone else and keep it in a secure place where nobody else, especially children, have access to it.  It will also cause or worsen constipation, so you may want to consider taking an over-the-counter stool softener while you are taking this medication.

## 2021-08-31 ENCOUNTER — Ambulatory Visit: Payer: Medicare PPO | Admitting: Family Medicine

## 2021-08-31 ENCOUNTER — Other Ambulatory Visit: Payer: Self-pay

## 2021-08-31 ENCOUNTER — Encounter: Payer: Self-pay | Admitting: Family Medicine

## 2021-08-31 VITALS — BP 138/76 | HR 86 | Temp 98.1°F | Resp 16 | Ht 69.0 in | Wt 215.0 lb

## 2021-08-31 DIAGNOSIS — K861 Other chronic pancreatitis: Secondary | ICD-10-CM

## 2021-08-31 DIAGNOSIS — R918 Other nonspecific abnormal finding of lung field: Secondary | ICD-10-CM

## 2021-08-31 DIAGNOSIS — N200 Calculus of kidney: Secondary | ICD-10-CM | POA: Diagnosis not present

## 2021-08-31 DIAGNOSIS — K5732 Diverticulitis of large intestine without perforation or abscess without bleeding: Secondary | ICD-10-CM | POA: Diagnosis not present

## 2021-08-31 DIAGNOSIS — I700 Atherosclerosis of aorta: Secondary | ICD-10-CM | POA: Diagnosis not present

## 2021-08-31 DIAGNOSIS — D72829 Elevated white blood cell count, unspecified: Secondary | ICD-10-CM

## 2021-08-31 LAB — CBC WITH DIFFERENTIAL/PLATELET
Absolute Monocytes: 587 cells/uL (ref 200–950)
Basophils Absolute: 43 cells/uL (ref 0–200)
Basophils Relative: 0.5 %
Eosinophils Absolute: 170 cells/uL (ref 15–500)
Eosinophils Relative: 2 %
HCT: 38.8 % (ref 38.5–50.0)
Hemoglobin: 12.9 g/dL — ABNORMAL LOW (ref 13.2–17.1)
Lymphs Abs: 2108 cells/uL (ref 850–3900)
MCH: 27.9 pg (ref 27.0–33.0)
MCHC: 33.2 g/dL (ref 32.0–36.0)
MCV: 84 fL (ref 80.0–100.0)
MPV: 11.8 fL (ref 7.5–12.5)
Monocytes Relative: 6.9 %
Neutro Abs: 5593 cells/uL (ref 1500–7800)
Neutrophils Relative %: 65.8 %
Platelets: 170 10*3/uL (ref 140–400)
RBC: 4.62 10*6/uL (ref 4.20–5.80)
RDW: 15.2 % — ABNORMAL HIGH (ref 11.0–15.0)
Total Lymphocyte: 24.8 %
WBC: 8.5 10*3/uL (ref 3.8–10.8)

## 2021-08-31 NOTE — Progress Notes (Signed)
Name: Shane Boyd.   MRN: [id number]    DOB: October 12, 1951   Date:08/31/2021       Progress Note  Subjective  Chief Complaint  UC Follow Up- Diverticulitis  HPI  Shane Boyd had a similar episode of abdominal pain in 2019 and was diagnosed with kidney stone. This time he felt tired and aching on Saturday, followed by some indigestion Sunday evening and intermittent abdominal pain.  He tried Tums, but symptoms did  improve , it was happening in waves and associated with some nausea so decided to go  to [facility] 09/25 evening. He had elevated white count, unremarkable urinalysis and CT showed diverticulitis, stable kidney stones, atherosclerosis of aorta, .and chronic calcified pancreatitis. Also infiltrate on left lung field . He denies any cough or sob   He was sent home on Augmentin TID and states pain has resolved, no fever or chills. He only vomited once and it was after he got home from [facility]. He only took one dose of pain pills because it made him feel strange.   He has normal appetite, no blood in stools. 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 ([place]) 10/17/2018   Right ureteral stone 08/15/2018   Bilateral inguinal hernia without obstruction or gangrene 08/13/2018   Fatty liver 08/13/2018   Difficulty balancing 05/16/2017   Special screening for malignant neoplasms, colon    Benign neoplasm of ascending colon    Benign neoplasm of transverse colon    Hearing loss, sensorineural, unilateral 10/21/2016   Dyslipidemia with low high density lipoprotein (HDL) cholesterol with hypertriglyceridemia due to type 2 diabetes mellitus ([place]) 09/16/2016   GAD (generalized anxiety disorder) 09/16/2016   Genital herpes 06/08/2016   Dizziness 03/09/2016   History of nonmelanoma skin cancer 09/30/2015   Neck pain 07/29/2015   Anxiety  07/19/2015   Insomnia, persistent 07/19/2015   CN (constipation) 07/19/2015   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 ([place]) 07/19/2015   Dyslipidemia 07/19/2015   Gastric reflux 07/19/2015   History of shingles 07/19/2015   History of basal cell cancer 07/19/2015   H/O Malignant melanoma 07/19/2015   Personal history of malignant neoplasm of head and neck 07/19/2015   Chronic recurrent major depressive disorder ([place]) 07/19/2015   Obesity (BMI 30.0-34.9) 07/19/2015   ED (erectile dysfunction) 07/19/2015    Past Surgical History:  Procedure Laterality Date   ARM SKIN LESION BIOPSY / EXCISION Right 05/18/2017   COLONOSCOPY     COLONOSCOPY WITH PROPOFOL N/A 12/15/2016   Procedure: COLONOSCOPY WITH PROPOFOL;  Surgeon: Lucilla Lame, MD;  Location: [place];  Service: Endoscopy;  Laterality: N/A;  diabetic - oral meds   COLONOSCOPY WITH PROPOFOL N/A 03/31/2020   Procedure: COLONOSCOPY WITH PROPOFOL;  Surgeon: Lucilla Lame, MD;  [facility] ENDOSCOPY;  Service: Endoscopy;  Laterality: N/A;   ESOPHAGOGASTRODUODENOSCOPY (EGD) WITH PROPOFOL N/A 03/31/2020   Procedure: ESOPHAGOGASTRODUODENOSCOPY (EGD) WITH PROPOFOL;  Surgeon: Lucilla Lame, MD;  Location[facility] ENDOSCOPY;  Service: Endoscopy;  Laterality: N/A;   NASAL SEPTUM SURGERY     POLYPECTOMY  12/15/2016   Procedure: POLYPECTOMY;  Surgeon: Lucilla Lame, MD;  Location: [place];  Service: Endoscopy;;   SENTINEL NODE BIOPSY Right 05/18/2017   [facility]    Family History  Problem Relation Age of Onset   Diabetes Mother  Heart disease Mother    Hyperlipidemia Mother    CVA Father    Cancer Father        Colon   Depression Father     Social History   Tobacco Use   Smoking status: Never   Smokeless tobacco: Never   Tobacco comments:    Smoking cessation materials not required  Substance Use Topics   Alcohol use: Yes    Alcohol/week: 0.0 standard drinks    Comment: Holidays     Current  Outpatient Medications:    ALPRAZolam (XANAX) 1 MG tablet, Take 0.5-1 tablets (0.5-1 mg total) by mouth daily as needed for anxiety., Disp: 10 tablet, Rfl: 0   amoxicillin-clavulanate (AUGMENTIN) 875-125 MG tablet, Take 1 tablet by mouth every 8 (eight) hours for 10 days., Disp: 30 tablet, Rfl: 0   amphetamine-dextroamphetamine (ADDERALL) 15 MG tablet, Take 1 tablet by mouth 2 (two) times daily., Disp: 60 tablet, Rfl: 0   amphetamine-dextroamphetamine (ADDERALL) 15 MG tablet, Take 1 tablet by mouth 2 (two) times daily., Disp: 60 tablet, Rfl: 0   amphetamine-dextroamphetamine (ADDERALL) 15 MG tablet, Take 1 tablet by mouth 2 (two) times daily., Disp: 60 tablet, Rfl: 0   atorvastatin (LIPITOR) 80 MG tablet, Take 1 tablet (80 mg total) by mouth daily., Disp: 90 tablet, Rfl: 1   buPROPion (WELLBUTRIN XL) 300 MG 24 hr tablet, Take 1 tablet (300 mg total) by mouth daily., Disp: 90 tablet, Rfl: 1   cholecalciferol (VITAMIN D3) 25 MCG (1000 UNIT) tablet, Take 1,000 Units by mouth daily., Disp: , Rfl:    docusate sodium (COLACE) 100 MG capsule, Take 1 tablet once or twice daily as needed for constipation while taking narcotic pain medicine, Disp: 30 capsule, Rfl: 0   famotidine (PEPCID) 20 MG tablet, Take 1 tablet by mouth once daily, Disp: 90 tablet, Rfl: 0   ferrous sulfate 325 (65 FE) MG EC tablet, Take 1 tablet (325 mg total) by mouth 2 (two) times daily., Disp: 180 tablet, Rfl: 0   HYDROcodone-acetaminophen (NORCO/VICODIN) 5-325 MG tablet, Take 2 tablets by mouth every 6 (six) hours as needed for moderate pain or severe pain., Disp: 16 tablet, Rfl: 0   meloxicam (MOBIC) 15 MG tablet, TAKE 1 TABLET BY MOUTH ONCE DAILY AS NEEDED FOR PAIN. TRY NOT TO TAKE DAILY., Disp: 90 tablet, Rfl: 0   metFORMIN (GLUCOPHAGE-XR) 750 MG 24 hr tablet, TAKE 2 TABLETS BY MOUTH ONCE DAILY WITH BREAKFAST, Disp: 180 tablet, Rfl: 1   omega-3 acid ethyl esters (LOVAZA) 1 g capsule, Take 2 capsules (2 g total) by mouth 2 (two) times  daily., Disp: 360 capsule, Rfl: 1   omeprazole (PRILOSEC) 40 MG capsule, Take 1 capsule by mouth once daily, Disp: 90 capsule, Rfl: 0   ondansetron (ZOFRAN ODT) 4 MG disintegrating tablet, Allow 1-2 tablets to dissolve in your mouth every 8 hours as needed for nausea/vomiting, Disp: 30 tablet, Rfl: 0   pregabalin (LYRICA) 50 MG capsule, Take 1 capsule (50 mg total) by mouth 2 (two) times daily., Disp: 180 capsule, Rfl: 1   tadalafil (CIALIS) 5 MG tablet, Take 1 tablet (5 mg total) by mouth daily as needed for erectile dysfunction., Disp: 90 tablet, Rfl: 1   tamsulosin (FLOMAX) 0.4 MG CAPS capsule, Take 1 capsule (0.4 mg total) by mouth daily., Disp: 90 capsule, Rfl: 1   valACYclovir (VALTREX) 500 MG tablet, TAKE ONE CAPLET BY MOUTH THREE TIMES DAILY, Disp: 90 tablet, Rfl: 1  No Known Allergies  I personally reviewed  active problem list, medication list, allergies, family history, social history, health maintenance with the patient/caregiver today.   ROS  Ten systems reviewed and is negative except as mentioned in HPI  Facial rash : from recent dermatological procedure  Objective  Vitals:   08/31/21 1139  BP: 138/76  Pulse: 86  Resp: 16  Temp: 98.1 F (36.7 C)  SpO2: 98%  Weight: 215 lb (97.5 kg)  Height: 5\' 9"  (1.753 m)    Body mass index is 31.75 kg/m.  Physical Exam  Constitutional: Patient appears well-developed and well-nourished. Obese  No distress.  HEENT: head atraumatic, normocephalic, pupils equal and reactive to light, neck supple, Cardiovascular: Normal rate, regular rhythm and normal heart sounds.  No murmur heard. No BLE edema. Pulmonary/Chest: Effort normal and breath sounds normal. No respiratory distress. Abdominal: Soft.  There is no tenderness. Mild decrease in bowel sounds  Psychiatric: Patient has a normal mood and affect. behavior is normal. Judgment and thought content normal.   Recent Results (from the past 2160 hour(s))  POCT HgB A1C     Status:  Abnormal   Collection Time: 06/16/21 11:31 AM  Result Value Ref Range   Hemoglobin A1C 6.3 (A) 4.0 - 5.6 %   HbA1c POC (<> result, manual entry)     HbA1c, POC (prediabetic range)     HbA1c, POC (controlled diabetic range)    CBC with Differential/Platelet     Status: Abnormal   Collection Time: 06/16/21  3:33 PM  Result Value Ref Range   WBC 7.6 3.8 - 10.8 Thousand/uL   RBC 4.84 4.20 - 5.80 Million/uL   Hemoglobin 12.9 (L) 13.2 - 17.1 g/dL   HCT 39.0 38.5 - 50.0 %   MCV 80.6 80.0 - 100.0 fL   MCH 26.7 (L) 27.0 - 33.0 pg   MCHC 33.1 32.0 - 36.0 g/dL   RDW 16.0 (H) 11.0 - 15.0 %   Platelets 174 140 - 400 Thousand/uL   MPV 11.1 7.5 - 12.5 fL   Neutro Abs 4,499 1,500 - 7,800 cells/uL   Lymphs Abs 2,409 850 - 3,900 cells/uL   Absolute Monocytes 502 200 - 950 cells/uL   Eosinophils Absolute 160 15 - 500 cells/uL   Basophils Absolute 30 0 - 200 cells/uL   Neutrophils Relative % 59.2 %   Total Lymphocyte 31.7 %   Monocytes Relative 6.6 %   Eosinophils Relative 2.1 %   Basophils Relative 0.4 %  Iron, TIBC and Ferritin Panel     Status: Abnormal   Collection Time: 06/16/21  3:33 PM  Result Value Ref Range   Iron 56 50 - 180 mcg/dL   TIBC 291 250 - 425 mcg/dL (calc)   %SAT 19 (L) 20 - 48 % (calc)   Ferritin 26 24 - 380 ng/mL  HM DIABETES EYE EXAM     Status: None   Collection Time: 08/03/21 12:00 AM  Result Value Ref Range   HM Diabetic Eye Exam No Retinopathy No Retinopathy    Comment: [facility]  Lipase, blood     Status: None   Collection Time: 08/29/21  9:33 PM  Result Value Ref Range   Lipase 21 11 - 51 U/L    Comment: Performed at [facility], [address] [place], [place] [address]  Comprehensive metabolic panel     Status: Abnormal   Collection Time: 08/29/21  9:33 PM  Result Value Ref Range   Sodium 138 135 - 145 mmol/L   Potassium 4.5  3.5 - 5.1 mmol/L   Chloride 101 98 - 111 mmol/L   CO2 24 22 - 32 mmol/L   Glucose, Bld 151 (H) 70 - 99 mg/dL     Comment: Glucose reference range applies only to samples taken after fasting for at least 8 hours.   BUN 15 8 - 23 mg/dL   Creatinine, Ser 1.06 0.61 - 1.24 mg/dL   Calcium 9.2 8.9 - 10.3 mg/dL   Total Protein 7.1 6.5 - 8.1 g/dL   Albumin 4.4 3.5 - 5.0 g/dL   AST 20 15 - 41 U/L   ALT 13 0 - 44 U/L   Alkaline Phosphatase 77 38 - 126 U/L   Total Bilirubin 0.9 0.3 - 1.2 mg/dL   GFR, Estimated >60 >60 mL/min    Comment: (NOTE) Calculated using the CKD-EPI Creatinine Equation (2021)    Anion gap 13 5 - 15    Comment: Performed at [facility], [address] [place], [place] [address]  CBC     Status: Abnormal   Collection Time: 08/29/21  9:33 PM  Result Value Ref Range   WBC 11.4 (H) 4.0 - 10.5 K/uL   RBC 4.72 4.22 - 5.81 MIL/uL   Hemoglobin 13.4 13.0 - 17.0 g/dL   HCT 39.9 39.0 - 52.0 %   MCV 84.5 80.0 - 100.0 fL   MCH 28.4 26.0 - 34.0 pg   MCHC 33.6 30.0 - 36.0 g/dL   RDW 15.1 11.5 - 15.5 %   Platelets 181 150 - 400 K/uL   nRBC 0.0 0.0 - 0.2 %    Comment: Performed at [facility], [address] [place], [place] [address]  Urinalysis, Complete w Microscopic     Status: Abnormal   Collection Time: 08/29/21  9:33 PM  Result Value Ref Range   Color, Urine YELLOW (A) YELLOW   APPearance CLEAR (A) CLEAR   Specific Gravity, Urine 1.011 1.005 - 1.030   pH 5.0 5.0 - 8.0   Glucose, UA NEGATIVE NEGATIVE mg/dL   Hgb urine dipstick NEGATIVE NEGATIVE   Bilirubin Urine NEGATIVE NEGATIVE   Ketones, ur 5 (A) NEGATIVE mg/dL   Protein, ur NEGATIVE NEGATIVE mg/dL   Nitrite NEGATIVE NEGATIVE   Leukocytes,Ua NEGATIVE NEGATIVE   RBC / HPF 0-5 0 - 5 RBC/hpf   WBC, UA 0-5 0 - 5 WBC/hpf   Bacteria, UA RARE (A) NONE SEEN   Squamous Epithelial / LPF NONE SEEN 0 - 5   Mucus PRESENT     Comment: Performed at [facility], [address] [place], [place] [address]     PHQ2/9: Depression screen [facility] 2/9 08/31/2021 06/16/2021 06/03/2021 03/10/2021 12/16/2020   Decreased Interest 1 1 2  0 1  Down, Depressed, Hopeless 1 1 2  0 1  PHQ - 2 Score 2 2 4  0 2  Altered sleeping 0 0 0 0 0  Tired, decreased energy 1 1 1  0 1  Change in appetite 0 0 0 0 0  Feeling bad or failure about yourself  0 0 0 0 0  Trouble concentrating 0 0 0 0 0  Moving slowly or fidgety/restless 0 0 0 0 0  Suicidal thoughts 0 0 0 0 0  PHQ-9 Score 3 3 5  0 3  Difficult doing work/chores - - Somewhat difficult - Not difficult at all  Some recent data might be hidden    phq 9 is positive   Fall Risk: Fall Risk  08/31/2021 06/16/2021 06/03/2021 03/10/2021 12/16/2020  Falls in the past year? 0 0 0 0 0  Number falls in past yr: 0 0 0 0 0  Injury with Fall? 0 0 0 0 0  Comment - - - - -  Risk for fall due to : No Fall Risks - No Fall Risks - -  Follow up Falls prevention discussed - Falls prevention discussed - -      Functional Status Survey: Is the patient deaf or have difficulty hearing?: Yes Does the patient have difficulty seeing, even when wearing glasses/contacts?: No Does the patient have difficulty concentrating, remembering, or making decisions?: No Does the patient have difficulty walking or climbing stairs?: No Does the patient have difficulty dressing or bathing?: No Does the patient have difficulty doing errands alone such as visiting a doctor's office or shopping?: No    Assessment & Plan  1. Chronic calcific pancreatitis [facility])  Reviewed CT with patient, normal lipase   2. Atherosclerosis of aorta ([place])  On statin therapy   3. Sigmoid diverticulitis  - CBC with Differential/Platelet  4. Nephrolithiasis   5. Infiltrate of upper lobe of left lung present on imaging study  - DG Chest 2 View; Future in 6 weeks  - CBC with Differential/Platelet  6. Leukocytosis, unspecified type  - CBC with Differential/Platelet

## 2021-09-12 ENCOUNTER — Other Ambulatory Visit: Payer: Self-pay | Admitting: Family Medicine

## 2021-09-12 DIAGNOSIS — N401 Enlarged prostate with lower urinary tract symptoms: Secondary | ICD-10-CM

## 2021-09-12 DIAGNOSIS — R351 Nocturia: Secondary | ICD-10-CM

## 2021-09-12 NOTE — Telephone Encounter (Signed)
Requested Prescriptions  Pending Prescriptions Disp Refills  . tamsulosin (FLOMAX) 0.4 MG CAPS capsule [Pharmacy Med Name: Tamsulosin HCl 0.4 MG Oral Capsule] 90 capsule 0    Sig: Take 1 capsule by mouth once daily     Urology: Alpha-Adrenergic Blocker Passed - 09/12/2021 11:09 AM      Passed - Last BP in normal range    BP Readings from Last 1 Encounters:  08/31/21 138/76         Passed - Valid encounter within last 12 months    Recent Outpatient Visits          1 week ago Chronic calcific pancreatitis [facility])   [facility] Steele Sizer, MD   2 months ago Type 2 diabetes mellitus with diabetic neuropathy, without long-term current use of insulin [facility])   [facility] [place], Drue Stager, MD   6 months ago Type 2 diabetes mellitus with diabetic neuropathy, without long-term current use of insulin [facility])   [facility] [place], Drue Stager, MD   9 months ago Type 2 diabetes mellitus with diabetic neuropathy, without long-term current use of insulin [facility])   [facility] Steele Sizer, MD   1 year ago Diabetes mellitus with microalbuminuria [facility])   [facility] Steele Sizer, MD      Future Appointments            In 5 days Steele Sizer, MD [facility], [place]   In 9 months  [facility], [facility]
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
## 2021-09-17 ENCOUNTER — Ambulatory Visit: Payer: Medicare PPO | Admitting: Family Medicine

## 2021-09-17 ENCOUNTER — Other Ambulatory Visit: Payer: Self-pay

## 2021-09-17 ENCOUNTER — Encounter: Payer: Self-pay | Admitting: Family Medicine

## 2021-09-17 VITALS — BP 134/80 | HR 95 | Temp 97.6°F | Resp 16 | Ht 69.0 in | Wt 217.0 lb

## 2021-09-17 DIAGNOSIS — R351 Nocturia: Secondary | ICD-10-CM

## 2021-09-17 DIAGNOSIS — I700 Atherosclerosis of aorta: Secondary | ICD-10-CM

## 2021-09-17 DIAGNOSIS — H9193 Unspecified hearing loss, bilateral: Secondary | ICD-10-CM

## 2021-09-17 DIAGNOSIS — E785 Hyperlipidemia, unspecified: Secondary | ICD-10-CM

## 2021-09-17 DIAGNOSIS — F339 Major depressive disorder, recurrent, unspecified: Secondary | ICD-10-CM | POA: Diagnosis not present

## 2021-09-17 DIAGNOSIS — E1140 Type 2 diabetes mellitus with diabetic neuropathy, unspecified: Secondary | ICD-10-CM

## 2021-09-17 DIAGNOSIS — Z23 Encounter for immunization: Secondary | ICD-10-CM | POA: Diagnosis not present

## 2021-09-17 DIAGNOSIS — N401 Enlarged prostate with lower urinary tract symptoms: Secondary | ICD-10-CM

## 2021-09-17 DIAGNOSIS — F411 Generalized anxiety disorder: Secondary | ICD-10-CM | POA: Diagnosis not present

## 2021-09-17 DIAGNOSIS — D508 Other iron deficiency anemias: Secondary | ICD-10-CM | POA: Diagnosis not present

## 2021-09-17 DIAGNOSIS — K861 Other chronic pancreatitis: Secondary | ICD-10-CM

## 2021-09-17 DIAGNOSIS — K219 Gastro-esophageal reflux disease without esophagitis: Secondary | ICD-10-CM

## 2021-09-17 DIAGNOSIS — N521 Erectile dysfunction due to diseases classified elsewhere: Secondary | ICD-10-CM

## 2021-09-17 LAB — POCT GLYCOSYLATED HEMOGLOBIN (HGB A1C): Hemoglobin A1C: 5.9 % — AB (ref 4.0–5.6)

## 2021-09-17 MED ORDER — AMPHETAMINE-DEXTROAMPHETAMINE 15 MG PO TABS: 15.0000 mg | ORAL_TABLET | Freq: Two times a day (BID) | ORAL | 0 refills | Status: DC

## 2021-09-17 MED ORDER — PREGABALIN 50 MG PO CAPS: 50.0000 mg | ORAL_CAPSULE | Freq: Two times a day (BID) | ORAL | 1 refills | Status: DC

## 2021-09-17 MED ORDER — TAMSULOSIN HCL 0.4 MG PO CAPS: 0.4000 mg | ORAL_CAPSULE | Freq: Every day | ORAL | 1 refills | Status: DC

## 2021-09-17 MED ORDER — TADALAFIL 5 MG PO TABS: 5.0000 mg | ORAL_TABLET | Freq: Every day | ORAL | 1 refills | Status: DC | PRN

## 2021-09-17 MED ORDER — ALPRAZOLAM 1 MG PO TABS: 0.5000 mg | ORAL_TABLET | Freq: Every day | ORAL | 0 refills | Status: DC | PRN

## 2021-09-25 ENCOUNTER — Other Ambulatory Visit: Payer: Self-pay | Admitting: Family Medicine

## 2021-09-25 DIAGNOSIS — K219 Gastro-esophageal reflux disease without esophagitis: Secondary | ICD-10-CM

## 2021-09-26 NOTE — Telephone Encounter (Signed)
Requested Prescriptions  Pending Prescriptions Disp Refills  . famotidine (PEPCID) 20 MG tablet [Pharmacy Med Name: Famotidine 20 MG Oral Tablet] 90 tablet 0    Sig: Take 1 tablet by mouth once daily     Gastroenterology:  H2 Antagonists Passed - 09/25/2021 10:30 AM      Passed - Valid encounter within last 12 months    Recent Outpatient Visits          1 week ago Type 2 diabetes mellitus with diabetic neuropathy, without long-term current use of insulin [facility])   [facility] [place], Drue Stager, MD   3 weeks ago Chronic calcific pancreatitis [facility]   [facility] Steele Sizer, MD   3 months ago Type 2 diabetes mellitus with diabetic neuropathy, without long-term current use of insulin [facility])   [facility] [place], Drue Stager, MD   6 months ago Type 2 diabetes mellitus with diabetic neuropathy, without long-term current use of insulin [facility])   [facility], Drue Stager, MD   9 months ago Type 2 diabetes mellitus with diabetic neuropathy, without long-term current use of insulin [facility])   [facility] Steele Sizer, MD      Future Appointments            In 2 months Ancil Boozer, Drue Stager, MD [facility], [place]   In 8 months  [facility], [place]           . omeprazole (PRILOSEC) 40 MG capsule [Pharmacy Med Name: Omeprazole 40 MG Oral Capsule Delayed Release] 90 capsule 0    Sig: Take 1 capsule by mouth once daily     Gastroenterology: Proton Pump Inhibitors Passed - 09/25/2021 10:30 AM      Passed - Valid encounter within last 12 months    Recent Outpatient Visits          1 week ago Type 2 diabetes mellitus with diabetic neuropathy, without long-term current use of insulin [facility])   [facility] [place], Drue Stager, MD   3 weeks ago Chronic calcific pancreatitis [facility])   [facility] Steele Sizer, MD   3 months ago Type 2  diabetes mellitus with diabetic neuropathy, without long-term current use of insulin [facility])   [facility] [place], Drue Stager, MD   6 months ago Type 2 diabetes mellitus with diabetic neuropathy, without long-term current use of insulin [facility])   [facility] [place], Drue Stager, MD   9 months ago Type 2 diabetes mellitus with diabetic neuropathy, without long-term current use of insulin [facility])   [facility] Steele Sizer, MD      Future Appointments            In 2 months Ancil Boozer, Drue Stager, MD [facility], [place]   In 8 months  [facility], [facility]

## 2021-12-18 ENCOUNTER — Other Ambulatory Visit: Payer: Self-pay | Admitting: Family Medicine

## 2021-12-18 DIAGNOSIS — K219 Gastro-esophageal reflux disease without esophagitis: Secondary | ICD-10-CM

## 2021-12-19 NOTE — Telephone Encounter (Signed)
Requested Prescriptions  Pending Prescriptions Disp Refills   omeprazole (PRILOSEC) 40 MG capsule [Pharmacy Med Name: Omeprazole 40 MG Oral Capsule Delayed Release] 90 capsule 0    Sig: Take 1 capsule by mouth once daily     Gastroenterology: Proton Pump Inhibitors Passed - 12/18/2021  7:06 PM      Passed - Valid encounter within last 12 months    Recent Outpatient Visits          3 months ago Type 2 diabetes mellitus with diabetic neuropathy, without long-term current use of insulin [facility])   [facility] [place], Drue Stager, MD   3 months ago Chronic calcific pancreatitis [facility])   [facility] Steele Sizer, MD   6 months ago Type 2 diabetes mellitus with diabetic neuropathy, without long-term current use of insulin [facility])   [facility] [place], Drue Stager, MD   9 months ago Type 2 diabetes mellitus with diabetic neuropathy, without long-term current use of insulin [facility])   [facility] [place], Drue Stager, MD   1 year ago Type 2 diabetes mellitus with diabetic neuropathy, without long-term current use of insulin [facility])   [facility] Steele Sizer, MD      Future Appointments            In 2 days Steele Sizer, MD [facility], [place]   In 5 months  [place]
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
## 2021-12-21 ENCOUNTER — Encounter: Payer: Self-pay | Admitting: Family Medicine

## 2021-12-21 ENCOUNTER — Ambulatory Visit: Payer: Medicare PPO | Admitting: Family Medicine

## 2021-12-21 VITALS — BP 138/72 | HR 87 | Temp 97.9°F | Resp 16 | Ht 69.0 in | Wt 213.0 lb

## 2021-12-21 DIAGNOSIS — I700 Atherosclerosis of aorta: Secondary | ICD-10-CM

## 2021-12-21 DIAGNOSIS — K219 Gastro-esophageal reflux disease without esophagitis: Secondary | ICD-10-CM

## 2021-12-21 DIAGNOSIS — E785 Hyperlipidemia, unspecified: Secondary | ICD-10-CM

## 2021-12-21 DIAGNOSIS — N521 Erectile dysfunction due to diseases classified elsewhere: Secondary | ICD-10-CM

## 2021-12-21 DIAGNOSIS — A6000 Herpesviral infection of urogenital system, unspecified: Secondary | ICD-10-CM

## 2021-12-21 DIAGNOSIS — Z23 Encounter for immunization: Secondary | ICD-10-CM

## 2021-12-21 DIAGNOSIS — E1140 Type 2 diabetes mellitus with diabetic neuropathy, unspecified: Secondary | ICD-10-CM

## 2021-12-21 DIAGNOSIS — E782 Mixed hyperlipidemia: Secondary | ICD-10-CM

## 2021-12-21 DIAGNOSIS — F411 Generalized anxiety disorder: Secondary | ICD-10-CM

## 2021-12-21 DIAGNOSIS — F339 Major depressive disorder, recurrent, unspecified: Secondary | ICD-10-CM

## 2021-12-21 DIAGNOSIS — E1169 Type 2 diabetes mellitus with other specified complication: Secondary | ICD-10-CM

## 2021-12-21 LAB — POCT GLYCOSYLATED HEMOGLOBIN (HGB A1C): Hemoglobin A1C: 6.1 % — AB (ref 4.0–5.6)

## 2021-12-21 MED ORDER — ATORVASTATIN CALCIUM 80 MG PO TABS: 80.0000 mg | ORAL_TABLET | Freq: Every day | ORAL | 1 refills | Status: DC

## 2021-12-21 MED ORDER — PREGABALIN 50 MG PO CAPS: 50.0000 mg | ORAL_CAPSULE | Freq: Two times a day (BID) | ORAL | 1 refills | Status: DC

## 2021-12-21 MED ORDER — AMPHETAMINE-DEXTROAMPHETAMINE 15 MG PO TABS: 15.0000 mg | ORAL_TABLET | Freq: Two times a day (BID) | ORAL | 0 refills | Status: DC

## 2021-12-21 MED ORDER — FAMOTIDINE 20 MG PO TABS: 20.0000 mg | ORAL_TABLET | Freq: Every day | ORAL | 1 refills | Status: DC

## 2021-12-21 MED ORDER — SHINGRIX 50 MCG/0.5ML IM SUSR: 0.5000 mL | Freq: Once | INTRAMUSCULAR | 1 refills | Status: AC

## 2021-12-21 MED ORDER — OMEGA-3-ACID ETHYL ESTERS 1 G PO CAPS: 2.0000 g | ORAL_CAPSULE | Freq: Two times a day (BID) | ORAL | 1 refills | Status: DC

## 2021-12-21 MED ORDER — ALPRAZOLAM 1 MG PO TABS: 0.5000 mg | ORAL_TABLET | Freq: Every day | ORAL | 0 refills | Status: DC | PRN

## 2021-12-21 MED ORDER — METFORMIN HCL ER 750 MG PO TB24: ORAL_TABLET | ORAL | 1 refills | Status: DC

## 2021-12-21 MED ORDER — BUPROPION HCL ER (XL) 300 MG PO TB24: 300.0000 mg | ORAL_TABLET | Freq: Every day | ORAL | 1 refills | Status: DC

## 2021-12-21 MED ORDER — VALACYCLOVIR HCL 500 MG PO TABS: ORAL_TABLET | ORAL | 1 refills | Status: DC

## 2022-02-24 DIAGNOSIS — D2262 Melanocytic nevi of left upper limb, including shoulder: Secondary | ICD-10-CM | POA: Diagnosis not present

## 2022-02-24 DIAGNOSIS — L570 Actinic keratosis: Secondary | ICD-10-CM | POA: Diagnosis not present

## 2022-02-24 DIAGNOSIS — X32XXXA Exposure to sunlight, initial encounter: Secondary | ICD-10-CM | POA: Diagnosis not present

## 2022-02-24 DIAGNOSIS — Z85820 Personal history of malignant melanoma of skin: Secondary | ICD-10-CM | POA: Diagnosis not present

## 2022-02-24 DIAGNOSIS — D225 Melanocytic nevi of trunk: Secondary | ICD-10-CM | POA: Diagnosis not present

## 2022-02-24 DIAGNOSIS — Z85828 Personal history of other malignant neoplasm of skin: Secondary | ICD-10-CM | POA: Diagnosis not present

## 2022-02-24 DIAGNOSIS — D2271 Melanocytic nevi of right lower limb, including hip: Secondary | ICD-10-CM | POA: Diagnosis not present

## 2022-02-24 DIAGNOSIS — D2261 Melanocytic nevi of right upper limb, including shoulder: Secondary | ICD-10-CM | POA: Diagnosis not present

## 2022-03-19 ENCOUNTER — Other Ambulatory Visit: Payer: Self-pay | Admitting: Family Medicine

## 2022-03-19 DIAGNOSIS — K219 Gastro-esophageal reflux disease without esophagitis: Secondary | ICD-10-CM

## 2022-03-21 ENCOUNTER — Other Ambulatory Visit: Payer: Self-pay

## 2022-03-21 DIAGNOSIS — F339 Major depressive disorder, recurrent, unspecified: Secondary | ICD-10-CM

## 2022-03-21 MED ORDER — AMPHETAMINE-DEXTROAMPHETAMINE 15 MG PO TABS: 15.0000 mg | ORAL_TABLET | Freq: Two times a day (BID) | ORAL | 0 refills | Status: DC
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
## 2022-03-23 ENCOUNTER — Encounter: Payer: Self-pay | Admitting: Family Medicine

## 2022-03-23 ENCOUNTER — Ambulatory Visit: Payer: Medicare PPO | Admitting: Family Medicine

## 2022-03-23 VITALS — BP 126/68 | HR 90 | Resp 16 | Ht 69.0 in | Wt 219.0 lb

## 2022-03-23 DIAGNOSIS — E785 Hyperlipidemia, unspecified: Secondary | ICD-10-CM

## 2022-03-23 DIAGNOSIS — N521 Erectile dysfunction due to diseases classified elsewhere: Secondary | ICD-10-CM

## 2022-03-23 DIAGNOSIS — Z862 Personal history of diseases of the blood and blood-forming organs and certain disorders involving the immune mechanism: Secondary | ICD-10-CM

## 2022-03-23 DIAGNOSIS — F339 Major depressive disorder, recurrent, unspecified: Secondary | ICD-10-CM | POA: Diagnosis not present

## 2022-03-23 DIAGNOSIS — E1140 Type 2 diabetes mellitus with diabetic neuropathy, unspecified: Secondary | ICD-10-CM | POA: Diagnosis not present

## 2022-03-23 DIAGNOSIS — R351 Nocturia: Secondary | ICD-10-CM

## 2022-03-23 DIAGNOSIS — K219 Gastro-esophageal reflux disease without esophagitis: Secondary | ICD-10-CM | POA: Diagnosis not present

## 2022-03-23 DIAGNOSIS — N401 Enlarged prostate with lower urinary tract symptoms: Secondary | ICD-10-CM | POA: Diagnosis not present

## 2022-03-23 DIAGNOSIS — Z79899 Other long term (current) drug therapy: Secondary | ICD-10-CM | POA: Diagnosis not present

## 2022-03-23 DIAGNOSIS — I700 Atherosclerosis of aorta: Secondary | ICD-10-CM

## 2022-03-23 DIAGNOSIS — E782 Mixed hyperlipidemia: Secondary | ICD-10-CM

## 2022-03-23 DIAGNOSIS — F411 Generalized anxiety disorder: Secondary | ICD-10-CM

## 2022-03-23 DIAGNOSIS — E1169 Type 2 diabetes mellitus with other specified complication: Secondary | ICD-10-CM | POA: Diagnosis not present

## 2022-03-23 DIAGNOSIS — K861 Other chronic pancreatitis: Secondary | ICD-10-CM

## 2022-03-23 MED ORDER — OMEPRAZOLE 40 MG PO CPDR: 40.0000 mg | DELAYED_RELEASE_CAPSULE | Freq: Every day | ORAL | 0 refills | Status: DC

## 2022-03-23 MED ORDER — ALPRAZOLAM 1 MG PO TABS: 0.5000 mg | ORAL_TABLET | Freq: Every day | ORAL | 0 refills | Status: DC | PRN

## 2022-03-23 MED ORDER — AMPHETAMINE-DEXTROAMPHETAMINE 15 MG PO TABS: 15.0000 mg | ORAL_TABLET | Freq: Two times a day (BID) | ORAL | 0 refills | Status: DC

## 2022-03-23 MED ORDER — TADALAFIL 5 MG PO TABS: 5.0000 mg | ORAL_TABLET | Freq: Every day | ORAL | 1 refills | Status: DC | PRN

## 2022-03-23 MED ORDER — TAMSULOSIN HCL 0.4 MG PO CAPS: 0.4000 mg | ORAL_CAPSULE | Freq: Every day | ORAL | 1 refills | Status: DC

## 2022-03-24 ENCOUNTER — Encounter: Payer: Self-pay | Admitting: Family Medicine

## 2022-03-24 LAB — IRON,TIBC AND FERRITIN PANEL
%SAT: 23 % (calc) (ref 20–48)
Ferritin: 47 ng/mL (ref 24–380)
Iron: 71 ug/dL (ref 50–180)
TIBC: 312 mcg/dL (calc) (ref 250–425)

## 2022-03-24 LAB — CBC WITH DIFFERENTIAL/PLATELET
Absolute Monocytes: 474 cells/uL (ref 200–950)
Basophils Absolute: 38 cells/uL (ref 0–200)
Basophils Relative: 0.6 %
Eosinophils Absolute: 250 cells/uL (ref 15–500)
Eosinophils Relative: 3.9 %
HCT: 41.7 % (ref 38.5–50.0)
Hemoglobin: 13.9 g/dL (ref 13.2–17.1)
Lymphs Abs: 2490 cells/uL (ref 850–3900)
MCH: 27.6 pg (ref 27.0–33.0)
MCHC: 33.3 g/dL (ref 32.0–36.0)
MCV: 82.9 fL (ref 80.0–100.0)
MPV: 11.7 fL (ref 7.5–12.5)
Monocytes Relative: 7.4 %
Neutro Abs: 3149 cells/uL (ref 1500–7800)
Neutrophils Relative %: 49.2 %
Platelets: 189 10*3/uL (ref 140–400)
RBC: 5.03 10*6/uL (ref 4.20–5.80)
RDW: 15 % (ref 11.0–15.0)
Total Lymphocyte: 38.9 %
WBC: 6.4 10*3/uL (ref 3.8–10.8)

## 2022-03-24 LAB — COMPLETE METABOLIC PANEL WITH GFR
AG Ratio: 2.1 (calc) (ref 1.0–2.5)
ALT: 11 U/L (ref 9–46)
AST: 13 U/L (ref 10–35)
Albumin: 4.6 g/dL (ref 3.6–5.1)
Alkaline phosphatase (APISO): 96 U/L (ref 35–144)
BUN: 15 mg/dL (ref 7–25)
CO2: 27 mmol/L (ref 20–32)
Calcium: 9.7 mg/dL (ref 8.6–10.3)
Chloride: 102 mmol/L (ref 98–110)
Creat: 1.06 mg/dL (ref 0.70–1.28)
Globulin: 2.2 g/dL (calc) (ref 1.9–3.7)
Glucose, Bld: 99 mg/dL (ref 65–99)
Potassium: 4.9 mmol/L (ref 3.5–5.3)
Sodium: 140 mmol/L (ref 135–146)
Total Bilirubin: 0.5 mg/dL (ref 0.2–1.2)
Total Protein: 6.8 g/dL (ref 6.1–8.1)
eGFR: 75 mL/min/{1.73_m2} (ref >=60)

## 2022-03-24 LAB — LIPID PANEL
Cholesterol: 128 mg/dL (ref <200)
HDL: 55 mg/dL (ref >=40)
LDL Cholesterol (Calc): 46 mg/dL (calc)
Non-HDL Cholesterol (Calc): 73 mg/dL (calc) (ref <130)
Total CHOL/HDL Ratio: 2.3 (calc) (ref <5.0)
Triglycerides: 200 mg/dL — ABNORMAL HIGH (ref <150)

## 2022-03-24 LAB — HEMOGLOBIN A1C
Hgb A1c MFr Bld: 6.1 % of total Hgb — ABNORMAL HIGH (ref <5.7)
Mean Plasma Glucose: 128 mg/dL
eAG (mmol/L): 7.1 mmol/L

## 2022-03-24 LAB — MICROALBUMIN / CREATININE URINE RATIO
Creatinine, Urine: 29 mg/dL (ref 20–320)
Microalb, Ur: <0.2 mg/dL

## 2022-03-24 LAB — PSA: PSA: 1.25 ng/mL (ref <=4.00)

## 2022-03-25 NOTE — Telephone Encounter (Signed)
Spoke with patient and let them know it is available and ready for pick up at [facility]  ?

## 2022-03-30 ENCOUNTER — Encounter: Payer: Self-pay | Admitting: Family Medicine

## 2022-03-30 ENCOUNTER — Other Ambulatory Visit: Payer: Self-pay | Admitting: Family Medicine

## 2022-03-30 DIAGNOSIS — N401 Enlarged prostate with lower urinary tract symptoms: Secondary | ICD-10-CM

## 2022-03-30 DIAGNOSIS — E1140 Type 2 diabetes mellitus with diabetic neuropathy, unspecified: Secondary | ICD-10-CM

## 2022-03-30 MED ORDER — TADALAFIL 5 MG PO TABS: 5.0000 mg | ORAL_TABLET | Freq: Every day | ORAL | 1 refills | Status: DC | PRN

## 2022-06-09 ENCOUNTER — Ambulatory Visit: Payer: Medicare PPO

## 2022-06-13 ENCOUNTER — Ambulatory Visit (INDEPENDENT_AMBULATORY_CARE_PROVIDER_SITE_OTHER): Payer: Medicare PPO | Admitting: Family Medicine

## 2022-06-13 ENCOUNTER — Encounter: Payer: Self-pay | Admitting: Family Medicine

## 2022-06-13 VITALS — Ht 69.0 in | Wt 219.0 lb

## 2022-06-13 DIAGNOSIS — H905 Unspecified sensorineural hearing loss: Secondary | ICD-10-CM | POA: Diagnosis not present

## 2022-06-13 DIAGNOSIS — Z0000 Encounter for general adult medical examination without abnormal findings: Secondary | ICD-10-CM | POA: Diagnosis not present

## 2022-06-13 DIAGNOSIS — E669 Obesity, unspecified: Secondary | ICD-10-CM | POA: Diagnosis not present

## 2022-06-13 DIAGNOSIS — Z85820 Personal history of malignant melanoma of skin: Secondary | ICD-10-CM | POA: Diagnosis not present

## 2022-06-13 DIAGNOSIS — Z85828 Personal history of other malignant neoplasm of skin: Secondary | ICD-10-CM | POA: Diagnosis not present

## 2022-06-13 DIAGNOSIS — E66811 Obesity, class 1: Secondary | ICD-10-CM

## 2022-06-13 NOTE — Patient Instructions (Signed)
  Shane Boyd , Thank you for taking time to come for your Medicare Wellness Visit. I appreciate your ongoing commitment to your health goals. Please review the following plan we discussed and let me know if I can assist you in the future.   These are the goals we discussed:  Goals      Patient Stated     Patient states he would like to remain active and healthy over the next year.         This is a list of the screening recommended for you and due dates:  Health Maintenance  Topic Date Due   COVID-19 Vaccine (6 - Booster for Pfizer series) 06/29/2022*   Zoster (Shingles) Vaccine (1 of 2) 09/13/2022*   Flu Shot  07/05/2022   Eye exam for diabetics  08/03/2022   Complete foot exam   09/17/2022   Hemoglobin A1C  09/22/2022   Urine Protein Check  03/24/2023   Colon Cancer Screening  03/31/2025   Tetanus Vaccine  10/05/2027   Pneumonia Vaccine  Completed   Hepatitis C Screening: USPSTF Recommendation to screen - Ages 18-79 yo.  Completed   HPV Vaccine  Aged Out  *Topic was postponed. The date shown is not the original due date.

## 2022-06-13 NOTE — Progress Notes (Signed)
Patient: Shane Colavito., Male    DOB: [birth year], 71 y.o.   MRN: [id number]  Visit Date: 06/13/2022  Today's Provider: Myles Gip, DO   Chief Complaint  Patient presents with   Medicare Well Visit    Subjective:   Shane Landgrebe. is a 71 y.o. male who presents today for his Subsequent Annual Wellness Visit.  Patient/Caregiver input:    HPI  IPSS Questionnaire (AUA-7): Over the past month.   1)  How often have you had a sensation of not emptying your bladder completely after you finish urinating?  0 - Not at all  2)  How often have you had to urinate again less than two hours after you finished urinating? 1 - Less than 1 time in 5  3)  How often have you found you stopped and started again several times when you urinated?  0 - Not at all  4) How difficult have you found it to postpone urination?  0 - Not at all  5) How often have you had a weak urinary stream?  3 - About half the time  6) How often have you had to push or strain to begin urination?  0 - Not at all  7) How many times did you most typically get up to urinate from the time you went to bed until the time you got up in the morning?  1 - 1 time  Total score:  0-7 mildly symptomatic   8-19 moderately symptomatic   20-35 severely symptomatic     Review of Systems Constitutional: Negative for fever or weight change.  Respiratory: Negative for cough and shortness of breath.   Cardiovascular: Negative for chest pain or palpitations.  Gastrointestinal: Negative for abdominal pain, no bowel changes.  Musculoskeletal: Negative for gait problem or joint swelling.  Skin: Negative for rash.  Neurological: Negative for dizziness or headache.  No other specific complaints in a complete review of systems (except as listed in HPI above).  Past Medical History:  Diagnosis Date   Anemia    Anxiety    Chronic insomnia    Constipation    Depression    Diabetes mellitus without complication ([place])    Encounter for  long-term (current) use of other high-risk medications    GERD (gastroesophageal reflux disease)    History of malignant melanoma    Dr. Koleen Nimrod   History of squamous cell carcinoma    Hx of basal cell carcinoma    Hyperlipidemia    Leukocytosis    Obesity    Other male erectile dysfunction    Vertigo    1-2x/yr    Past Surgical History:  Procedure Laterality Date   ARM SKIN LESION BIOPSY / EXCISION Right 05/18/2017   COLONOSCOPY     COLONOSCOPY WITH PROPOFOL N/A 12/15/2016   Procedure: COLONOSCOPY WITH PROPOFOL;  Surgeon: Lucilla Lame, MD;  Location: [place];  Service: Endoscopy;  Laterality: N/A;  diabetic - oral meds   COLONOSCOPY WITH PROPOFOL N/A 03/31/2020   Procedure: COLONOSCOPY WITH PROPOFOL;  Surgeon: Lucilla Lame, MD;  Location[facility] ENDOSCOPY;  Service: Endoscopy;  Laterality: N/A;   ESOPHAGOGASTRODUODENOSCOPY (EGD) WITH PROPOFOL N/A 03/31/2020   Procedure: ESOPHAGOGASTRODUODENOSCOPY (EGD) WITH PROPOFOL;  Surgeon: Lucilla Lame, MD;  Location: [facility] ENDOSCOPY;  Service: Endoscopy;  Laterality: N/A;   NASAL SEPTUM SURGERY     POLYPECTOMY  12/15/2016   Procedure: POLYPECTOMY;  Surgeon: Lucilla Lame, MD;  Location: [place];  Service: Endoscopy;;  SENTINEL NODE BIOPSY Right 05/18/2017   [facility]    Family History  Problem Relation Age of Onset   Diabetes Mother    Heart disease Mother    Hyperlipidemia Mother    CVA Father    Cancer Father        Colon   Depression Father     Social History   Socioeconomic History   Marital status: Married    Spouse name: Neoma Laming   Number of children: 1   Years of education: Not on file   Highest education level: Master's degree (e.g., MA, MS, MEng, MEd, MSW, MBA)  Occupational History   Occupation: retired  Tobacco Use   Smoking status: Never   Smokeless tobacco: Never   Tobacco comments:    Smoking cessation materials not required  Vaping Use   Vaping Use: Never used  Substance and Sexual Activity    Alcohol use: Yes    Alcohol/week: 0.0 standard drinks of alcohol    Comment: Holidays   Drug use: No   Sexual activity: Yes    Partners: Female  Other Topics Concern   Not on file  Social History Narrative   One son: [name] lives in [place]   One son Trip , died from possible suicide while living in [place]   Enjoys music and movies   Social Determinants of Health   Financial Resource Strain: Low Risk  (06/03/2021)   Overall Financial Resource Strain (CARDIA)    Difficulty of Paying Living Expenses: Not hard at all  Food Insecurity: No Food Insecurity (06/03/2021)   Hunger Vital Sign    Worried About Running Out of Food in the Last Year: Never true    [place] in the Last Year: Never true  Transportation Needs: No Transportation Needs (06/03/2021)   PRAPARE - Hydrologist (Medical): No    Lack of Transportation (Non-Medical): No  Physical Activity: Inactive (06/03/2021)   Exercise Vital Sign    Days of Exercise per Week: 0 days    Minutes of Exercise per Session: 0 min  Stress: Stress Concern Present (06/03/2021)   [place]    Feeling of Stress : To some extent  Social Connections: Moderately Isolated (06/03/2021)   Social Connection and Isolation Panel [NHANES]    Frequency of Communication with Friends and Family: More than three times a week    Frequency of Social Gatherings with Friends and Family: Once a week    Attends Religious Services: Never    Marine scientist or Organizations: No    Attends Archivist Meetings: Never    Marital Status: Married  Human resources officer Violence: Not At Risk (06/03/2021)   Humiliation, Afraid, Rape, and Kick questionnaire    Fear of Current or Ex-Partner: No    Emotionally Abused: No    Physically Abused: No    Sexually Abused: No    Outpatient Encounter Medications as of 06/13/2022  Medication Sig   ALPRAZolam (XANAX) 1 MG  tablet Take 0.5-1 tablets (0.5-1 mg total) by mouth daily as needed for anxiety.   amphetamine-dextroamphetamine (ADDERALL) 15 MG tablet Take 1 tablet by mouth 2 (two) times daily.   amphetamine-dextroamphetamine (ADDERALL) 15 MG tablet Take 1 tablet by mouth 2 (two) times daily.   amphetamine-dextroamphetamine (ADDERALL) 15 MG tablet Take 1 tablet by mouth 2 (two) times daily.   atorvastatin (LIPITOR) 80 MG tablet Take 1 tablet (80 mg total) by  mouth daily.   buPROPion (WELLBUTRIN XL) 300 MG 24 hr tablet Take 1 tablet (300 mg total) by mouth daily.   cholecalciferol (VITAMIN D3) 25 MCG (1000 UNIT) tablet Take 1,000 Units by mouth daily.   docusate sodium (COLACE) 100 MG capsule Take 1 tablet once or twice daily as needed for constipation while taking narcotic pain medicine   famotidine (PEPCID) 20 MG tablet Take 1 tablet (20 mg total) by mouth daily.   ferrous sulfate 325 (65 FE) MG EC tablet Take 1 tablet (325 mg total) by mouth 2 (two) times daily.   meloxicam (MOBIC) 15 MG tablet TAKE 1 TABLET BY MOUTH ONCE DAILY AS NEEDED FOR PAIN. TRY NOT TO TAKE DAILY.   metFORMIN (GLUCOPHAGE-XR) 750 MG 24 hr tablet TAKE 2 TABLETS BY MOUTH ONCE DAILY WITH BREAKFAST   omega-3 acid ethyl esters (LOVAZA) 1 g capsule Take 2 capsules (2 g total) by mouth 2 (two) times daily.   omeprazole (PRILOSEC) 40 MG capsule Take 1 capsule (40 mg total) by mouth daily.   pregabalin (LYRICA) 50 MG capsule Take 1 capsule (50 mg total) by mouth 2 (two) times daily.   tadalafil (CIALIS) 5 MG tablet Take 1 tablet (5 mg total) by mouth daily as needed for erectile dysfunction.   tamsulosin (FLOMAX) 0.4 MG CAPS capsule Take 1 capsule (0.4 mg total) by mouth daily.   valACYclovir (VALTREX) 500 MG tablet TAKE ONE CAPLET BY MOUTH THREE TIMES DAILY   No facility-administered encounter medications on file as of 06/13/2022.    No Known Allergies  Care Team Updated in EHR: Yes  Last Vision Exam: 09/2021 Wears corrective lenses:  Yes Last Dental Exam: 05/2022 Last Hearing Exam: 3 years ago Wears Hearing Aids: not currently  Functional Ability / Safety Screening 1.  Was the timed Get Up and Go test longer than 30 seconds?   Not able to complete due to virtual setting 2.  Does the patient need help with the phone, transportation, shopping,      preparing meals, housework, laundry, medications, or managing money?  no 3.  Does the patient's home have:  loose throw rugs in the hallway?   no      Grab bars in the bathroom? no      Handrails on the stairs?   yes      Poor lighting?   no 4.  Has the patient noticed any hearing difficulties?   yes  Fall Risk:  See screening under Objective Information  Depression Screen:  See screening under Objective Information  Advanced Directives: A voluntary discussion about advance care planning including the explanation and discussion of advance directives was discussed with the patient. Explanation about the health care proxy and living will was reviewed.  During this discussion, the patient was able to identify a health care proxy as Shane Boyd and plans/does not plan to fill out the paperwork required and will bring this to our office to keep on file. Does patient have a HCPOA?    yes If yes, name and contact information:  Does patient have a living will or MOST form?  yes  Cancer Screenings: Skin: UTD, goes twice yearly Lung: Low Dose CT Chest recommended if Age 71-80 years, 30 pack-year currently smoking OR have quit w/in 15years. Patient does not qualify.  Lifestyle risk factor issued reviewed: Diet, exercise, weight management, advised patient smoking is not healthy, nutrition/diet.   Prostate: UTD Colon: UTD  Additional Screenings:  Hepatitis B/HIV/Syphillis: no concerns Hepatitis C Screening:  due Intimate Partner Violence: none AAA Screen: Men age 39 to 92 years if ever smoked recommended to get a one time AAA ultrasound screening exam. Patient does not  qualify.  Objective:   Vitals: Ht 5' 9" (1.753 m) Comment: per chart  Wt 219 lb (99.3 kg) Comment: per chart  BMI 32.34 kg/m  Body mass index is 32.34 kg/m.  Lab Results  Component Value Date   CHOL 128 03/23/2022   CHOL 130 03/10/2021   CHOL 116 09/05/2019   Lab Results  Component Value Date   HDL 55 03/23/2022   HDL 42 03/10/2021   HDL 45 09/05/2019   Lab Results  Component Value Date   LDLCALC 46 03/23/2022   LDLCALC 58 03/10/2021   LDLCALC 47 09/05/2019   Lab Results  Component Value Date   TRIG 200 (H) 03/23/2022   TRIG 238 (H) 03/10/2021   TRIG 160 (H) 09/05/2019   Lab Results  Component Value Date   CHOLHDL 2.3 03/23/2022   CHOLHDL 3.1 03/10/2021   CHOLHDL 2.6 09/05/2019   No results found for: "LDLDIRECT"  No results found.  Physical Exam Entirety of visit conducted over the phone.  Speaks in full sentences, no respiratory distress.   Cognitive Testing - 6-CIT  Correct? Score   What year is it? yes 0 Yes = 0    No = 4  What month is it? yes 0 Yes = 0    No = 3  Remember:     Pia Mau, [place], [place]     What time is it? yes 0 Yes = 0    No = 3  Count backwards from 20 to 1 yes 0 Correct = 0    1 error = 2   More than 1 error = 4  Say the months of the year in reverse. yes 0 Correct = 0    1 error = 2   More than 1 error = 4  What address did I ask you to remember? yes 0 Correct = 0  1 error = 2    2 error = 4    3 error = 6    4 error = 8    All wrong = 10       TOTAL SCORE  0/28   Interpretation:  Normal  Normal (0-7) Abnormal (8-28)   Fall Risk:    06/13/2022    9:09 AM 03/23/2022   11:26 AM 12/21/2021   11:31 AM 09/17/2021   10:46 AM 08/31/2021   11:38 AM  Fall Risk   Falls in the past year? 0 0 0 0 0  Number falls in past yr:  0 0 0 0  Injury with Fall?  0 0 0 0  Risk for fall due to : _0   Follow up Falls prevention discussed;Education provided;Falls  evaluation completed Falls prevention discussed Falls prevention discussed Falls prevention discussed Falls prevention discussed    Depression Screen    06/13/2022    9:09 AM 03/23/2022   11:26 AM 12/21/2021   11:31 AM 09/17/2021   10:54 AM 08/31/2021   11:38 AM  Depression screen PHQ 2/9  Decreased Interest 0 1 0 1 1  Down, Depressed, Hopeless 0 1 0 1 1  PHQ - 2 Score 0 2 0 2 2  Altered sleeping 0 0 0 0 0  Tired, decreased energy 0 0 0  0 1  Change in appetite 0 0 0 0 0  Feeling bad or failure about yourself  0 0 0 0 0  Trouble concentrating 0 0 0 0 0  Moving slowly or fidgety/restless 0 0 0 0 0  Suicidal thoughts 0 0 0 0 0  PHQ-9 Score 0 2 0 2 3    Recent Results (from the past 2160 hour(s))  HgB A1c     Status: Abnormal   Collection Time: 03/23/22 12:26 PM  Result Value Ref Range   Hgb A1c MFr Bld 6.1 (H) <5.7 % of total Hgb    Comment: For someone without known diabetes, a hemoglobin  A1c value between 5.7% and 6.4% is consistent with prediabetes and should be confirmed with a  follow-up test. . For someone with known diabetes, a value <7% indicates that their diabetes is well controlled. A1c targets should be individualized based on duration of diabetes, age, comorbid conditions, and other considerations. . This assay result is consistent with an increased risk of diabetes. . Currently, no consensus exists regarding use of hemoglobin A1c for diagnosis of diabetes for children. .    Mean Plasma Glucose 128 mg/dL   eAG (mmol/L) 7.1 mmol/L  Lipid panel     Status: Abnormal   Collection Time: 03/23/22 12:26 PM  Result Value Ref Range   Cholesterol 128 <200 mg/dL   HDL 55 > OR = 40 mg/dL   Triglycerides 200 (H) <150 mg/dL    Comment: . If a non-fasting specimen was collected, consider repeat triglyceride testing on a fasting specimen if clinically indicated.  Yates Decamp et al. J. of Clin. Lipidol. 9485;4:[id number]. Marland Kitchen    LDL Cholesterol (Calc) 46 mg/dL (calc)     Comment: Reference range: <100 . Desirable range <100 mg/dL for primary prevention;   <70 mg/dL for patients with CHD or diabetic patients  with > or = 2 CHD risk factors. Marland Kitchen LDL-C is now calculated using the Martin-Hopkins  calculation, which is a validated novel method providing  better accuracy than the Friedewald equation in the  estimation of LDL-C.  Cresenciano Genre et al. Annamaria Helling. 0093;818(29): 2061-2068  (http://education.[facility].com/faq/FAQ164)    Total CHOL/HDL Ratio 2.3 <5.0 (calc)   Non-HDL Cholesterol (Calc) 73 <130 mg/dL (calc)    Comment: For patients with diabetes plus 1 major ASCVD risk  factor, treating to a non-HDL-C goal of <100 mg/dL  (LDL-C of <70 mg/dL) is considered a therapeutic  option.   Microalbumin / creatinine urine ratio     Status: None   Collection Time: 03/23/22 12:26 PM  Result Value Ref Range   Creatinine, Urine 29 20 - 320 mg/dL   Microalb, Ur <0.2 mg/dL    Comment: Reference Range Not established    Microalb Creat Ratio NOTE <30 mcg/mg creat    Comment: NOTE: The urine albumin value is less than  0.2 mg/dL therefore we are unable to calculate  excretion and/or creatinine ratio. . The ADA defines abnormalities in albumin excretion as follows: Marland Kitchen Albuminuria Category        Result (mcg/mg creatinine) . Normal to Mildly increased   <30 Moderately increased         30-299  Severely increased           > OR = 300 . The ADA recommends that at least two of three specimens collected within a 3-6 month period be abnormal before considering a patient to be within a diagnostic category.   COMPLETE METABOLIC PANEL WITH GFR  Status: None   Collection Time: 03/23/22 12:26 PM  Result Value Ref Range   Glucose, Bld 99 65 - 99 mg/dL    Comment: .            Fasting reference interval .    BUN 15 7 - 25 mg/dL   Creat 1.06 0.70 - 1.28 mg/dL   eGFR 75 > OR = 60 mL/min/1.69m    Comment: The eGFR is based on the CKD-EPI 2021 equation. To  calculate  the new eGFR from a previous Creatinine or Cystatin C result, go to https://www.kidney.org/professionals/ kdoqi/gfr%5Fcalculator    BUN/Creatinine Ratio NOT APPLICABLE 6 - 22 (calc)   Sodium 140 135 - 146 mmol/L   Potassium 4.9 3.5 - 5.3 mmol/L   Chloride 102 98 - 110 mmol/L   CO2 27 20 - 32 mmol/L   Calcium 9.7 8.6 - 10.3 mg/dL   Total Protein 6.8 6.1 - 8.1 g/dL   Albumin 4.6 3.6 - 5.1 g/dL   Globulin 2.2 1.9 - 3.7 g/dL (calc)   AG Ratio 2.1 1.0 - 2.5 (calc)   Total Bilirubin 0.5 0.2 - 1.2 mg/dL   Alkaline phosphatase (APISO) 96 35 - 144 U/L   AST 13 10 - 35 U/L   ALT 11 9 - 46 U/L  CBC with Differential/Platelet     Status: None   Collection Time: 03/23/22 12:26 PM  Result Value Ref Range   WBC 6.4 3.8 - 10.8 Thousand/uL   RBC 5.03 4.20 - 5.80 Million/uL   Hemoglobin 13.9 13.2 - 17.1 g/dL   HCT 41.7 38.5 - 50.0 %   MCV 82.9 80.0 - 100.0 fL   MCH 27.6 27.0 - 33.0 pg   MCHC 33.3 32.0 - 36.0 g/dL   RDW 15.0 11.0 - 15.0 %   Platelets 189 140 - 400 Thousand/uL   MPV 11.7 7.5 - 12.5 fL   Neutro Abs 3,149 1,500 - 7,800 cells/uL   Lymphs Abs 2,490 850 - 3,900 cells/uL   Absolute Monocytes 474 200 - 950 cells/uL   Eosinophils Absolute 250 15 - 500 cells/uL   Basophils Absolute 38 0 - 200 cells/uL   Neutrophils Relative % 49.2 %   Total Lymphocyte 38.9 %   Monocytes Relative 7.4 %   Eosinophils Relative 3.9 %   Basophils Relative 0.6 %  Iron, TIBC and Ferritin Panel     Status: None   Collection Time: 03/23/22 12:26 PM  Result Value Ref Range   Iron 71 50 - 180 mcg/dL   TIBC 312 250 - 425 mcg/dL (calc)   %SAT 23 20 - 48 % (calc)   Ferritin 47 24 - 380 ng/mL  PSA     Status: None   Collection Time: 03/23/22 12:26 PM  Result Value Ref Range   PSA 1.25 < OR = 4.00 ng/mL    Comment: The total PSA value from this assay system is  standardized against the WHO standard. The test  result will be approximately 20% lower when compared  to the equimolar-standardized  total PSA (Beckman  Coulter). Comparison of serial PSA results should be  interpreted with this fact in mind. . This test was performed using the Siemens  chemiluminescent method. Values obtained from  different assay methods cannot be used interchangeably. PSA levels, regardless of value, should not be interpreted as absolute evidence of the presence or absence of disease.      Assessment & Plan:     1. Hearing loss, sensorineural, unilateral Not  interested in additional hearing evaluation at this time.   2. H/O Malignant melanoma Continue to follow with Derm for regular skin checks.   3. Obesity (BMI 30.0-34.9) Continue to work on increasing physical activity and healthy eating.   4. History of nonmelanoma skin cancer Continue to follow with Derm for regular skin checks.    Exercise Activities and Dietary recommendations  Goals      Patient Stated     Patient states he would like to remain active and healthy over the next year.        Discussed health benefits of physical activity, and encouraged him to engage in regular exercise appropriate for his age and condition.   Immunization History  Administered Date(s) Administered   Fluad Quad(high Dose 65+) 09/05/2019, 09/09/2020, 09/17/2021   Influenza Split 10/21/2008, 08/14/2012   Influenza, High Dose Seasonal PF 07/29/2015, 09/16/2016, 08/13/2018   Influenza, Seasonal, Injecte, Preservative Fre 09/20/2011   Influenza,inj,Quad PF,6+ Mos 09/18/2013   Influenza-Unspecified 09/18/2013   PFIZER Comirnaty(Gray Top)Covid-19 Tri-Sucrose Vaccine 03/26/2021   PFIZER(Purple Top)SARS-COV-2 Vaccination 01/31/2020, 02/25/2020, 09/02/2020, 08/20/2021   Pneumococcal Conjugate-13 03/17/2010, 02/09/2016   Pneumococcal Polysaccharide-23 03/17/2010, 07/04/2017   Td 10/18/2006   Tdap 10/18/2006, 10/04/2017   Zoster, Live 08/14/2012    Health Maintenance  Topic Date Due   COVID-19 Vaccine (6 - Booster for Pfizer series)  06/29/2022 (Originally 10/15/2021)   Zoster Vaccines- Shingrix (1 of 2) 09/13/2022 (Originally 07/09/1970)   INFLUENZA VACCINE  07/05/2022   OPHTHALMOLOGY EXAM  08/03/2022   FOOT EXAM  09/17/2022   HEMOGLOBIN A1C  09/22/2022   URINE MICROALBUMIN  03/24/2023   COLONOSCOPY (Pts 45-13yr Insurance coverage will need to be confirmed)  03/31/2025   TETANUS/TDAP  10/05/2027   Pneumonia Vaccine 71 Years old  Completed   Hepatitis C Screening  Completed   HPV VACCINES  Aged Out     No orders of the defined types were placed in this encounter.   Current Outpatient Medications:    ALPRAZolam (XANAX) 1 MG tablet, Take 0.5-1 tablets (0.5-1 mg total) by mouth daily as needed for anxiety., Disp: 10 tablet, Rfl: 0   amphetamine-dextroamphetamine (ADDERALL) 15 MG tablet, Take 1 tablet by mouth 2 (two) times daily., Disp: 60 tablet, Rfl: 0   amphetamine-dextroamphetamine (ADDERALL) 15 MG tablet, Take 1 tablet by mouth 2 (two) times daily., Disp: 60 tablet, Rfl: 0   amphetamine-dextroamphetamine (ADDERALL) 15 MG tablet, Take 1 tablet by mouth 2 (two) times daily., Disp: 60 tablet, Rfl: 0   atorvastatin (LIPITOR) 80 MG tablet, Take 1 tablet (80 mg total) by mouth daily., Disp: 90 tablet, Rfl: 1   buPROPion (WELLBUTRIN XL) 300 MG 24 hr tablet, Take 1 tablet (300 mg total) by mouth daily., Disp: 90 tablet, Rfl: 1   cholecalciferol (VITAMIN D3) 25 MCG (1000 UNIT) tablet, Take 1,000 Units by mouth daily., Disp: , Rfl:    docusate sodium (COLACE) 100 MG capsule, Take 1 tablet once or twice daily as needed for constipation while taking narcotic pain medicine, Disp: 30 capsule, Rfl: 0   famotidine (PEPCID) 20 MG tablet, Take 1 tablet (20 mg total) by mouth daily., Disp: 90 tablet, Rfl: 1   ferrous sulfate 325 (65 FE) MG EC tablet, Take 1 tablet (325 mg total) by mouth 2 (two) times daily., Disp: 180 tablet, Rfl: 0   meloxicam (MOBIC) 15 MG tablet, TAKE 1 TABLET BY MOUTH ONCE DAILY AS NEEDED FOR PAIN. TRY NOT TO  TAKE DAILY., Disp: 90 tablet, Rfl: 0  metFORMIN (GLUCOPHAGE-XR) 750 MG 24 hr tablet, TAKE 2 TABLETS BY MOUTH ONCE DAILY WITH BREAKFAST, Disp: 180 tablet, Rfl: 1   omega-3 acid ethyl esters (LOVAZA) 1 g capsule, Take 2 capsules (2 g total) by mouth 2 (two) times daily., Disp: 360 capsule, Rfl: 1   omeprazole (PRILOSEC) 40 MG capsule, Take 1 capsule (40 mg total) by mouth daily., Disp: 90 capsule, Rfl: 0   pregabalin (LYRICA) 50 MG capsule, Take 1 capsule (50 mg total) by mouth 2 (two) times daily., Disp: 180 capsule, Rfl: 1   tadalafil (CIALIS) 5 MG tablet, Take 1 tablet (5 mg total) by mouth daily as needed for erectile dysfunction., Disp: 90 tablet, Rfl: 1   tamsulosin (FLOMAX) 0.4 MG CAPS capsule, Take 1 capsule (0.4 mg total) by mouth daily., Disp: 90 capsule, Rfl: 1   valACYclovir (VALTREX) 500 MG tablet, TAKE ONE CAPLET BY MOUTH THREE TIMES DAILY, Disp: 90 tablet, Rfl: 1 There are no discontinued medications.  I have personally reviewed and addressed the Medicare Annual Wellness health risk assessment questionnaire and have noted the following in the patient's chart:  A.         Medical and social history & family history B.         Use of alcohol, tobacco or illicit drugs  C.         Current medications and supplements D.         Functional and Cognitive ability and status E.         Nutritional status F.         Physical activity G.        Advance directives H.         List of other physicians I.          Hospitalizations, surgeries, and ER visits in previous 12 months J.         [place] such as hearing and vision if needed, cognitive and depression L.         Referrals and appointments - none  In addition, I have reviewed and discussed with patient certain preventive protocols, quality metrics, and best practice recommendations. A written personalized care plan for preventive services as well as general preventive health recommendations were provided to  patient.   See attached scanned questionnaire for additional information.   No follow-ups on file.

## 2022-06-18 ENCOUNTER — Other Ambulatory Visit: Payer: Self-pay | Admitting: Family Medicine

## 2022-06-18 DIAGNOSIS — E1140 Type 2 diabetes mellitus with diabetic neuropathy, unspecified: Secondary | ICD-10-CM
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
## 2022-06-22 ENCOUNTER — Encounter: Payer: Self-pay | Admitting: Family Medicine

## 2022-06-22 ENCOUNTER — Ambulatory Visit: Payer: Medicare PPO | Admitting: Family Medicine

## 2022-06-22 VITALS — BP 132/70 | HR 96 | Resp 16 | Ht 69.0 in | Wt 217.0 lb

## 2022-06-22 DIAGNOSIS — E1140 Type 2 diabetes mellitus with diabetic neuropathy, unspecified: Secondary | ICD-10-CM

## 2022-06-22 DIAGNOSIS — A6000 Herpesviral infection of urogenital system, unspecified: Secondary | ICD-10-CM | POA: Diagnosis not present

## 2022-06-22 DIAGNOSIS — E1169 Type 2 diabetes mellitus with other specified complication: Secondary | ICD-10-CM

## 2022-06-22 DIAGNOSIS — K219 Gastro-esophageal reflux disease without esophagitis: Secondary | ICD-10-CM

## 2022-06-22 DIAGNOSIS — F411 Generalized anxiety disorder: Secondary | ICD-10-CM

## 2022-06-22 DIAGNOSIS — N521 Erectile dysfunction due to diseases classified elsewhere: Secondary | ICD-10-CM

## 2022-06-22 DIAGNOSIS — E782 Mixed hyperlipidemia: Secondary | ICD-10-CM

## 2022-06-22 DIAGNOSIS — H905 Unspecified sensorineural hearing loss: Secondary | ICD-10-CM

## 2022-06-22 DIAGNOSIS — F339 Major depressive disorder, recurrent, unspecified: Secondary | ICD-10-CM

## 2022-06-22 DIAGNOSIS — I700 Atherosclerosis of aorta: Secondary | ICD-10-CM | POA: Diagnosis not present

## 2022-06-22 DIAGNOSIS — E785 Hyperlipidemia, unspecified: Secondary | ICD-10-CM | POA: Diagnosis not present

## 2022-06-22 DIAGNOSIS — K861 Other chronic pancreatitis: Secondary | ICD-10-CM

## 2022-06-22 LAB — POCT GLYCOSYLATED HEMOGLOBIN (HGB A1C): Hemoglobin A1C: 6.2 % — AB (ref 4.0–5.6)

## 2022-06-22 MED ORDER — BUPROPION HCL ER (XL) 300 MG PO TB24: 300.0000 mg | ORAL_TABLET | Freq: Every day | ORAL | 1 refills | Status: DC

## 2022-06-22 MED ORDER — ALPRAZOLAM 1 MG PO TABS: 0.5000 mg | ORAL_TABLET | Freq: Every day | ORAL | 0 refills | Status: DC | PRN

## 2022-06-22 MED ORDER — AMPHETAMINE-DEXTROAMPHETAMINE 15 MG PO TABS: 15.0000 mg | ORAL_TABLET | Freq: Two times a day (BID) | ORAL | 0 refills | Status: DC

## 2022-06-22 MED ORDER — OMEPRAZOLE 40 MG PO CPDR: 40.0000 mg | DELAYED_RELEASE_CAPSULE | Freq: Every day | ORAL | 1 refills | Status: DC

## 2022-06-22 MED ORDER — METFORMIN HCL ER 750 MG PO TB24: ORAL_TABLET | ORAL | 1 refills | Status: DC

## 2022-06-22 MED ORDER — VALACYCLOVIR HCL 500 MG PO TABS: ORAL_TABLET | ORAL | 1 refills | Status: DC

## 2022-06-22 MED ORDER — ATORVASTATIN CALCIUM 80 MG PO TABS
80.0000 mg | ORAL_TABLET | Freq: Every day | ORAL | 1 refills | Status: DC
Start: 2022-06-22 — End: 2022-12-22

## 2022-06-22 MED ORDER — PREGABALIN 75 MG PO CAPS: 75.0000 mg | ORAL_CAPSULE | Freq: Two times a day (BID) | ORAL | 1 refills | Status: DC

## 2022-06-22 MED ORDER — OMEGA-3-ACID ETHYL ESTERS 1 G PO CAPS: 2.0000 g | ORAL_CAPSULE | Freq: Two times a day (BID) | ORAL | 1 refills | Status: DC

## 2022-06-25 ENCOUNTER — Other Ambulatory Visit: Payer: Self-pay | Admitting: Family Medicine

## 2022-06-25 DIAGNOSIS — K219 Gastro-esophageal reflux disease without esophagitis: Secondary | ICD-10-CM

## 2022-07-15 NOTE — Patient Instructions (Signed)
Preventive Care 65 Years and Older, Male Preventive care refers to lifestyle choices and visits with your health care provider that can promote health and wellness. Preventive care visits are also called wellness exams. What can I expect for my preventive care visit? Counseling During your preventive care visit, your health care provider may ask about your: Medical history, including: Past medical problems. Family medical history. History of falls. Current health, including: Emotional well-being. Home life and relationship well-being. Sexual activity. Memory and ability to understand (cognition). Lifestyle, including: Alcohol, nicotine or tobacco, and drug use. Access to firearms. Diet, exercise, and sleep habits. Work and work environment. Sunscreen use. Safety issues such as seatbelt and bike helmet use. Physical exam Your health care provider will check your: Height and weight. These may be used to calculate your BMI (body mass index). BMI is a measurement that tells if you are at a healthy weight. Waist circumference. This measures the distance around your waistline. This measurement also tells if you are at a healthy weight and may help predict your risk of certain diseases, such as type 2 diabetes and high blood pressure. Heart rate and blood pressure. Body temperature. Skin for abnormal spots. What immunizations do I need?  Vaccines are usually given at various ages, according to a schedule. Your health care provider will recommend vaccines for you based on your age, medical history, and lifestyle or other factors, such as travel or where you work. What tests do I need? Screening Your health care provider may recommend screening tests for certain conditions. This may include: Lipid and cholesterol levels. Diabetes screening. This is done by checking your blood sugar (glucose) after you have not eaten for a while (fasting). Hepatitis C test. Hepatitis B test. HIV (human  immunodeficiency virus) test. STI (sexually transmitted infection) testing, if you are at risk. Lung cancer screening. Colorectal cancer screening. Prostate cancer screening. Abdominal aortic aneurysm (AAA) screening. You may need this if you are a current or former smoker. Talk with your health care provider about your test results, treatment options, and if necessary, the need for more tests. Follow these instructions at home: Eating and drinking  Eat a diet that includes fresh fruits and vegetables, whole grains, lean protein, and low-fat dairy products. Limit your intake of foods with high amounts of sugar, saturated fats, and salt. Take vitamin and mineral supplements as recommended by your health care provider. Do not drink alcohol if your health care provider tells you not to drink. If you drink alcohol: Limit how much you have to 0-2 drinks a day. Know how much alcohol is in your drink. In the [place] one drink equals one 12 oz bottle of beer (355 mL), one 5 oz glass of wine (148 mL), or one 1 oz glass of hard liquor (44 mL). Lifestyle Brush your teeth every morning and night with fluoride toothpaste. Floss one time each day. Exercise for at least 30 minutes 5 or more days each week. Do not use any products that contain nicotine or tobacco. These products include cigarettes, chewing tobacco, and vaping devices, such as e-cigarettes. If you need help quitting, ask your health care provider. Do not use drugs. If you are sexually active, practice safe sex. Use a condom or other form of protection to prevent STIs. Take aspirin only as told by your health care provider. Make sure that you understand how much to take and what form to take. Work with your health care provider to find out whether it is safe   and beneficial for you to take aspirin daily. Ask your health care provider if you need to take a cholesterol-lowering medicine (statin). Find healthy ways to manage stress, such  as: Meditation, yoga, or listening to music. Journaling. Talking to a trusted person. Spending time with friends and family. Safety Always wear your seat belt while driving or riding in a vehicle. Do not drive: If you have been drinking alcohol. Do not ride with someone who has been drinking. When you are tired or distracted. While texting. If you have been using any mind-altering substances or drugs. Wear a helmet and other protective equipment during sports activities. If you have firearms in your house, make sure you follow all gun safety procedures. Minimize exposure to UV radiation to reduce your risk of skin cancer. What's next? Visit your health care provider once a year for an annual wellness visit. Ask your health care provider how often you should have your eyes and teeth checked. Stay up to date on all vaccines. This information is not intended to replace advice given to you by your health care provider. Make sure you discuss any questions you have with your health care provider. Document Revised: 05/19/2021 Document Reviewed: 05/19/2021 Elsevier Patient Education  2023 [employer]  

## 2022-07-15 NOTE — Progress Notes (Unsigned)
Name: Shane Boyd.   MRN: [id number]    DOB: 02/05/1951   Date:07/18/2022       Progress Note  Subjective  Chief Complaint  Annual Exam  HPI  Patient presents for annual CPE.  IPSS Questionnaire (AUA-7): Over the past month.   1)  How often have you had a sensation of not emptying your bladder completely after you finish urinating?  0 - Not at all  2)  How often have you had to urinate again less than two hours after you finished urinating? 0 - Not at all  3)  How often have you found you stopped and started again several times when you urinated?  0 - Not at all  4) How difficult have you found it to postpone urination?  0 - Not at all  5) How often have you had a weak urinary stream?  0 - Not at all  6) How often have you had to push or strain to begin urination?  0 - Not at all  7) How many times did you most typically get up to urinate from the time you went to bed until the time you got up in the morning?  1 - 1 time  Total score:  0-7 mildly symptomatic   8-19 moderately symptomatic   20-35 severely symptomatic     Diet: balanced most of the time  Exercise: discussed regular physical activity  Last Dental Exam:every 6 months  Last Eye Exam: yearly  Depression: phq 9 is negative    07/18/2022   11:36 AM 06/22/2022   11:56 AM 06/13/2022    9:09 AM 03/23/2022   11:26 AM 12/21/2021   11:31 AM  Depression screen PHQ 2/9  Decreased Interest 0 0 0 1 0  Down, Depressed, Hopeless 0 0 0 1 0  PHQ - 2 Score 0 0 0 2 0  Altered sleeping 0 0 0 0 0  Tired, decreased energy 0 0 0 0 0  Change in appetite 0 0 0 0 0  Feeling bad or failure about yourself  0 0 0 0 0  Trouble concentrating 0 1 0 0 0  Moving slowly or fidgety/restless 0 0 0 0 0  Suicidal thoughts 0 0 0 0 0  PHQ-9 Score 0 1 0 2 0    Hypertension:  BP Readings from Last 3 Encounters:  07/18/22 132/70  06/22/22 132/70  03/23/22 126/68    Obesity: Wt Readings from Last 3 Encounters:  07/18/22 222 lb (100.7  kg)  06/22/22 217 lb (98.4 kg)  06/13/22 219 lb (99.3 kg)   BMI Readings from Last 3 Encounters:  07/18/22 32.78 kg/m  06/22/22 32.05 kg/m  06/13/22 32.34 kg/m     Lipids:  Lab Results  Component Value Date   CHOL 128 03/23/2022   CHOL 130 03/10/2021   CHOL 116 09/05/2019   Lab Results  Component Value Date   HDL 55 03/23/2022   HDL 42 03/10/2021   HDL 45 09/05/2019   Lab Results  Component Value Date   LDLCALC 46 03/23/2022   LDLCALC 58 03/10/2021   LDLCALC 47 09/05/2019   Lab Results  Component Value Date   TRIG 200 (H) 03/23/2022   TRIG 238 (H) 03/10/2021   TRIG 160 (H) 09/05/2019   Lab Results  Component Value Date   CHOLHDL 2.3 03/23/2022   CHOLHDL 3.1 03/10/2021   CHOLHDL 2.6 09/05/2019   No results found for: "LDLDIRECT" Glucose:  Glucose  Date Value  Ref Range Status  09/29/2014 191 (H) 65 - 99 mg/dL Final   Glucose, Bld  Date Value Ref Range Status  03/23/2022 99 65 - 99 mg/dL Final    Comment:    .            Fasting reference interval .   08/29/2021 151 (H) 70 - 99 mg/dL Final    Comment:    Glucose reference range applies only to samples taken after fasting for at least 8 hours.  03/10/2021 91 65 - 99 mg/dL Final    Comment:    .            Fasting reference interval .    Glucose-Capillary  Date Value Ref Range Status  03/31/2020 100 (H) 70 - 99 mg/dL Final    Comment:    Glucose reference range applies only to samples taken after fasting for at least 8 hours.  12/15/2016 95 65 - 99 mg/dL Final  12/15/2016 101 (H) 65 - 99 mg/dL Final    Flowsheet Row Clinical Support from 06/03/2021 in [facility]  AUDIT-C Score 0      Married STD testing and prevention (HIV/chl/gon/syphilis): N/A Sexual history: one partner, uses cialis and doing well  Hep C Screening: 09/10/12 Skin cancer: Discussed monitoring for atypical lesions - under the care of dermatologist every 6 months  Colorectal cancer: 03/31/20 Prostate  cancer:   Lab Results  Component Value Date   PSA 1.25 03/23/2022   PSA 1.0 06/04/2020   PSA Normal, 1.0 09/10/2012     Lung cancer:  Low Dose CT Chest recommended if Age 75-80 years, 30 pack-year currently smoking OR have quit w/in 15years. Patient  no a candidate for screening   AAA: The USPSTF recommends one-time screening with ultrasonography in men ages 11 to 66 years who have ever smoked. Patient   no, a candidate for screening  ECG:  02/06/20  Vaccines:   Tdap: up to date Shingrix: sending rx to pharmacy  Pneumonia: today  Flu: up to date COVID-57: up to date, discussed booster   Advanced Care Planning: A voluntary discussion about advance care planning including the explanation and discussion of advance directives.  Discussed health care proxy and Living will, and the patient was able to identify a health care proxy as wife.  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 ([place]) 10/17/2018   Right ureteral stone 08/15/2018   Bilateral inguinal hernia without obstruction or gangrene 08/13/2018   Fatty liver 08/13/2018   Difficulty balancing 05/16/2017   Benign neoplasm of ascending colon    Benign neoplasm of transverse colon    Hearing loss, sensorineural, unilateral 10/21/2016   Dyslipidemia with low high density lipoprotein (HDL) cholesterol with hypertriglyceridemia due to type 2 diabetes mellitus ([place]) 09/16/2016   GAD (generalized anxiety disorder) 09/16/2016   Genital herpes 06/08/2016   Dizziness 03/09/2016   History of nonmelanoma skin cancer 09/30/2015   Neck pain 07/29/2015   Anxiety 07/19/2015   Insomnia, persistent 07/19/2015   CN (constipation) 07/19/2015   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 ([place]) 07/19/2015   Dyslipidemia 07/19/2015   Gastric reflux 07/19/2015   History of shingles 07/19/2015  History of basal cell cancer 07/19/2015   H/O Malignant melanoma 07/19/2015   Personal history of malignant neoplasm of head and neck 07/19/2015   Chronic recurrent major depressive disorder ([place]) 07/19/2015   Obesity (BMI 30.0-34.9) 07/19/2015   ED (erectile dysfunction) 07/19/2015    Past Surgical History:  Procedure Laterality Date   ARM SKIN LESION BIOPSY / EXCISION Right 05/18/2017   COLONOSCOPY     COLONOSCOPY WITH PROPOFOL N/A 12/15/2016   Procedure: COLONOSCOPY WITH PROPOFOL;  Surgeon: Lucilla Lame, MD;  Location: [place];  Service: Endoscopy;  Laterality: N/A;  diabetic - oral meds   COLONOSCOPY WITH PROPOFOL N/A 03/31/2020   Procedure: COLONOSCOPY WITH PROPOFOL;  Surgeon: Lucilla Lame, MD;  Location: [facility] ENDOSCOPY;  Service: Endoscopy;  Laterality: N/A;   ESOPHAGOGASTRODUODENOSCOPY (EGD) WITH PROPOFOL N/A 03/31/2020   Procedure: ESOPHAGOGASTRODUODENOSCOPY (EGD) WITH PROPOFOL;  Surgeon: Lucilla Lame, MD;  Location: [facility] [facility] ENDOSCOPY;  Service: Endoscopy;  Laterality: N/A;   NASAL SEPTUM SURGERY     POLYPECTOMY  12/15/2016   Procedure: POLYPECTOMY;  Surgeon: Lucilla Lame, MD;  Location: [place];  Service: Endoscopy;;   SENTINEL NODE BIOPSY Right 05/18/2017   [facility]    Family History  Problem Relation Age of Onset   Diabetes Mother    Heart disease Mother    Hyperlipidemia Mother    CVA Father    Cancer Father        Colon   Depression Father     Social History   Socioeconomic History   Marital status: Married    Spouse name: Neoma Laming   Number of children: 1   Years of education: Not on file   Highest education level: Master's degree (e.g., MA, MS, MEng, MEd, MSW, MBA)  Occupational History   Occupation: retired  Tobacco Use   Smoking status: Never   Smokeless tobacco: Never   Tobacco comments:    Smoking cessation  materials not required  Vaping Use   Vaping Use: Never used  Substance and Sexual Activity   Alcohol use: Yes    Alcohol/week: 0.0 standard drinks of alcohol    Comment: Holidays   Drug use: No   Sexual activity: Yes    Partners: Female  Other Topics Concern   Not on file  Social History Narrative   One son: [name] lives in [place]   One son Trip , died from possible suicide while living in [place]   Enjoys music and movies   Social Determinants of Health   Financial Resource Strain: Low Risk  (07/18/2022)   Overall Financial Resource Strain (CARDIA)    Difficulty of Paying Living Expenses: Not hard at all  Food Insecurity: No Food Insecurity (07/18/2022)   Hunger Vital Sign    Worried About Running Out of Food in the Last Year: Never true    Ran Out of Food in the Last Year: Never true  Transportation Needs: No Transportation Needs (07/18/2022)   PRAPARE - Hydrologist (Medical): No    Lack of Transportation (Non-Medical): No  Physical Activity: Insufficiently Active (07/18/2022)   Exercise Vital Sign    Days of Exercise per Week: 3 days    Minutes of Exercise per Session: 20 min  Stress: No Stress Concern Present (07/18/2022)   [place]    Feeling of Stress : Not at all  Social Connections: Moderately Isolated (07/18/2022)   Social Connection and Isolation Panel [NHANES]    Frequency of  Communication with Friends and Family: More than three times a week    Frequency of Social Gatherings with Friends and Family: Once a week    Attends Religious Services: Never    Marine scientist or Organizations: No    Attends Archivist Meetings: Never    Marital Status: Married  Human resources officer Violence: Not At Risk (07/18/2022)   Humiliation, Afraid, Rape, and Kick questionnaire    Fear of Current or Ex-Partner: No    Emotionally Abused: No    Physically Abused: No    Sexually  Abused: No     Current Outpatient Medications:    ALPRAZolam (XANAX) 1 MG tablet, Take 0.5-1 tablets (0.5-1 mg total) by mouth daily as needed for anxiety., Disp: 10 tablet, Rfl: 0   amphetamine-dextroamphetamine (ADDERALL) 15 MG tablet, Take 1 tablet by mouth 2 (two) times daily., Disp: 60 tablet, Rfl: 0   amphetamine-dextroamphetamine (ADDERALL) 15 MG tablet, Take 1 tablet by mouth 2 (two) times daily., Disp: 60 tablet, Rfl: 0   amphetamine-dextroamphetamine (ADDERALL) 15 MG tablet, Take 1 tablet by mouth 2 (two) times daily., Disp: 60 tablet, Rfl: 0   atorvastatin (LIPITOR) 80 MG tablet, Take 1 tablet (80 mg total) by mouth daily., Disp: 90 tablet, Rfl: 1   buPROPion (WELLBUTRIN XL) 300 MG 24 hr tablet, Take 1 tablet (300 mg total) by mouth daily., Disp: 90 tablet, Rfl: 1   cholecalciferol (VITAMIN D3) 25 MCG (1000 UNIT) tablet, Take 1,000 Units by mouth daily., Disp: , Rfl:    docusate sodium (COLACE) 100 MG capsule, Take 1 tablet once or twice daily as needed for constipation while taking narcotic pain medicine, Disp: 30 capsule, Rfl: 0   famotidine (PEPCID) 20 MG tablet, Take 1 tablet by mouth once daily, Disp: 90 tablet, Rfl: 1   ferrous sulfate 325 (65 FE) MG EC tablet, Take 1 tablet (325 mg total) by mouth 2 (two) times daily., Disp: 180 tablet, Rfl: 0   meloxicam (MOBIC) 15 MG tablet, TAKE 1 TABLET BY MOUTH ONCE DAILY AS NEEDED FOR PAIN. TRY NOT TO TAKE DAILY., Disp: 90 tablet, Rfl: 0   metFORMIN (GLUCOPHAGE-XR) 750 MG 24 hr tablet, TAKE 2 TABLETS BY MOUTH ONCE DAILY WITH BREAKFAST, Disp: 180 tablet, Rfl: 1   omega-3 acid ethyl esters (LOVAZA) 1 g capsule, Take 2 capsules (2 g total) by mouth 2 (two) times daily., Disp: 360 capsule, Rfl: 1   omeprazole (PRILOSEC) 40 MG capsule, Take 1 capsule (40 mg total) by mouth daily., Disp: 90 capsule, Rfl: 1   pregabalin (LYRICA) 75 MG capsule, Take 1 capsule (75 mg total) by mouth 2 (two) times daily., Disp: 180 capsule, Rfl: 1   tadalafil  (CIALIS) 5 MG tablet, Take 1 tablet (5 mg total) by mouth daily as needed for erectile dysfunction., Disp: 90 tablet, Rfl: 1   tamsulosin (FLOMAX) 0.4 MG CAPS capsule, Take 1 capsule (0.4 mg total) by mouth daily., Disp: 90 capsule, Rfl: 1   valACYclovir (VALTREX) 500 MG tablet, TAKE ONE CAPLET BY MOUTH THREE TIMES DAILY, Disp: 90 tablet, Rfl: 1  No Known Allergies   ROS  Constitutional: Negative for fever or weight change.  Respiratory: Negative for cough and shortness of breath.   Cardiovascular: Negative for chest pain or palpitations.  Gastrointestinal: Negative for abdominal pain, no bowel changes.  Musculoskeletal: Negative for gait problem or joint swelling.  Skin: Negative for rash.  Neurological: Negative for dizziness or headache.  No other specific complaints in a complete  review of systems (except as listed in HPI above).    Objective  Vitals:   07/18/22 1137  BP: 132/70  Pulse: 86  Resp: 16  SpO2: 96%  Weight: 222 lb (100.7 kg)  Height: '5\' 9"'$  (1.753 m)    Body mass index is 32.78 kg/m.  Physical Exam  Constitutional: Patient appears well-developed and well-nourished. Obesity  No distress.  HENT: Head: Normocephalic and atraumatic. Ears: B TMs ok, no erythema or effusion; Nose: Nose normal. Mouth/Throat: Oropharynx is clear and moist. No oropharyngeal exudate.  Eyes: Conjunctivae and EOM are normal. Pupils are equal, round, and reactive to light. No scleral icterus.  Neck: Normal range of motion. Neck supple. No JVD present. No thyromegaly present.  Cardiovascular: Normal rate, regular rhythm and normal heart sounds.  No murmur heard. No BLE edema. Pulmonary/Chest: Effort normal and breath sounds normal. No respiratory distress. Abdominal: Soft. Bowel sounds are normal, no distension. There is no tenderness. no masses MALE GENITALIA: Normal descended testes bilaterally, no masses palpated, no hernias, no lesions, no discharge RECTAL:not done  Musculoskeletal:  Normal range of motion, no joint effusions. No gross deformities Neurological: he is alert and oriented to person, place, and time. No cranial nerve deficit. Coordination, balance, strength, speech and gait are normal.  Skin: Small erythema on bridge of the nose.  Psychiatric: Patient has a normal mood and affect. behavior is normal. Judgment and thought content normal.   Recent Results (from the past 2160 hour(s))  POCT HgB A1C     Status: Abnormal   Collection Time: 06/22/22 12:06 PM  Result Value Ref Range   Hemoglobin A1C 6.2 (A) 4.0 - 5.6 %   HbA1c POC (<> result, manual entry)     HbA1c, POC (prediabetic range)     HbA1c, POC (controlled diabetic range)       Fall Risk:    07/18/2022   11:36 AM 06/22/2022   11:56 AM 06/13/2022    9:09 AM 03/23/2022   11:26 AM 12/21/2021   11:31 AM  Fall Risk   Falls in the past year? 0 0 0 0 0  Number falls in past yr: 0 0  0 0  Injury with Fall? 0 0  0 0  Risk for fall due to : No Fall Risks No Fall Risks No Fall Risks No Fall Risks No Fall Risks  Follow up Falls prevention discussed Falls prevention discussed Falls prevention discussed;Education provided;Falls evaluation completed Falls prevention discussed Falls prevention discussed     Functional Status Survey: Is the patient deaf or have difficulty hearing?: No Does the patient have difficulty seeing, even when wearing glasses/contacts?: No Does the patient have difficulty concentrating, remembering, or making decisions?: No Does the patient have difficulty walking or climbing stairs?: No Does the patient have difficulty dressing or bathing?: No Does the patient have difficulty doing errands alone such as visiting a doctor's office or shopping?: No    Assessment & Plan  1. Well adult exam   2. Need for shingles vaccine  - Zoster Vaccine Adjuvanted [facility]) injection; Inject 0.5 mLs into the muscle once for 1 dose.  Dispense: 0.5 mL; Refill: 0  3. Need for pneumococcal  20-valent conjugate vaccination  - Pneumococcal conjugate vaccine 20-valent (Prevnar 20)     -Prostate cancer screening and PSA options (with potential risks and benefits of testing vs not testing) were discussed along with recent recs/guidelines. -USPSTF grade A and B recommendations reviewed with patient; age-appropriate recommendations, preventive care, screening tests, etc discussed  and encouraged; healthy living encouraged; see AVS for patient education given to patient -Discussed importance of 150 minutes of physical activity weekly, eat two servings of fish weekly, eat one serving of tree nuts ( cashews, pistachios, pecans, almonds.Marland Kitchen) every other day, eat 6 servings of fruit/vegetables daily and drink plenty of water and avoid sweet beverages.  -Reviewed Health Maintenance: yes

## 2022-07-18 ENCOUNTER — Encounter: Payer: Self-pay | Admitting: Family Medicine

## 2022-07-18 ENCOUNTER — Ambulatory Visit (INDEPENDENT_AMBULATORY_CARE_PROVIDER_SITE_OTHER): Payer: Medicare PPO | Admitting: Family Medicine

## 2022-07-18 VITALS — BP 132/70 | HR 86 | Resp 16 | Ht 69.0 in | Wt 222.0 lb

## 2022-07-18 DIAGNOSIS — Z23 Encounter for immunization: Secondary | ICD-10-CM | POA: Diagnosis not present

## 2022-07-18 DIAGNOSIS — Z0000 Encounter for general adult medical examination without abnormal findings: Secondary | ICD-10-CM | POA: Diagnosis not present

## 2022-07-18 MED ORDER — SHINGRIX 50 MCG/0.5ML IM SUSR: 0.5000 mL | Freq: Once | INTRAMUSCULAR | 0 refills | Status: AC

## 2022-09-01 DIAGNOSIS — Z85828 Personal history of other malignant neoplasm of skin: Secondary | ICD-10-CM | POA: Diagnosis not present

## 2022-09-01 DIAGNOSIS — D225 Melanocytic nevi of trunk: Secondary | ICD-10-CM | POA: Diagnosis not present

## 2022-09-01 DIAGNOSIS — Z85820 Personal history of malignant melanoma of skin: Secondary | ICD-10-CM | POA: Diagnosis not present

## 2022-09-01 DIAGNOSIS — D2262 Melanocytic nevi of left upper limb, including shoulder: Secondary | ICD-10-CM | POA: Diagnosis not present

## 2022-09-01 DIAGNOSIS — L570 Actinic keratosis: Secondary | ICD-10-CM | POA: Diagnosis not present

## 2022-09-01 DIAGNOSIS — D2261 Melanocytic nevi of right upper limb, including shoulder: Secondary | ICD-10-CM | POA: Diagnosis not present

## 2022-09-01 DIAGNOSIS — D2272 Melanocytic nevi of left lower limb, including hip: Secondary | ICD-10-CM | POA: Diagnosis not present

## 2022-09-12 DIAGNOSIS — H43391 Other vitreous opacities, right eye: Secondary | ICD-10-CM | POA: Diagnosis not present

## 2022-09-12 DIAGNOSIS — E119 Type 2 diabetes mellitus without complications: Secondary | ICD-10-CM | POA: Diagnosis not present

## 2022-09-12 DIAGNOSIS — H2513 Age-related nuclear cataract, bilateral: Secondary | ICD-10-CM | POA: Diagnosis not present

## 2022-09-12 LAB — HM DIABETES EYE EXAM

## 2022-09-17 ENCOUNTER — Other Ambulatory Visit: Payer: Self-pay | Admitting: Family Medicine

## 2022-09-17 DIAGNOSIS — R1031 Right lower quadrant pain: Secondary | ICD-10-CM

## 2022-09-21 NOTE — Progress Notes (Unsigned)
Name: Shane Boyd.   MRN: [id number]    DOB: 1951/02/07   Date:09/22/2022       Progress Note  Subjective  Chief Complaint  Follow Up  HPI  Dizziness: he still feels lightheaded when he stands up quickly and that has been stable, however he has noticed increase in dizziness sometimes with vertigo when he reaches out / looking up . Discussed vertebral artery problems, discussed imaging like CTA and also discussed referral to neurologist , he wants to thinks  about it and let me know what he prefers   GERD: he switched to Omeprazole at night and H2 blocker in am's Well controlled   Groin pain:  We discussed possible OA and referral to Ortho but we decided to try  Meloxicam and changed shoe wear  ( [facility])  He is also wearing knee braces and uses prn lidocaine creams on his knees but doing well for the past couple of months . Discussed X-rays likely OA  Chronic calcified pancreatitis: found on CT abdomen done for evaluation of diverticulitis. No symptoms and lipase normal in the past  Unchanged    Dyslipidemia/Atherosclerosis of Aorta : he is taking Lovaza BID and atorvastatin, he denies  myalgia. Last LDL was 46 , he is also taking aspirin 81 mg.Continue medications recheck labs yearly    DMII with ED: he is taking Metformin 1500 mg daily, he  denies polyphagia, polydipsia or polyuria. He states Lyrica is working very well for neuropathy,he is on Cialis for ED , A1C has been controlled at 6.2 %   Major Depressive Disorder/GAD  he is doing better, currently on Wellbutrin, Adderal, he takes alprazolam 1 mg prn. HIs mood has been stable   He is taking Alprazolam prn last rx for 10 was given April 2023 and does not need a refill today   Hearing loss: he has been to audiologist, he has tried otc hearing aids but it did not seem to work . Unchanged    BPH: currently doing well on cialis an flomax, he states once he started medication his urinary urgency resolved, nocturia only once per night,  last PSA was 1.5 and at goal    Patient Active Problem List   Diagnosis Date Noted   Chronic calcific pancreatitis ([place]) 03/23/2022   Iron deficiency anemia due to chronic blood loss    Polyp of ascending colon    Type 2 diabetes mellitus with pressure callus ([place]) 10/17/2018   Right ureteral stone 08/15/2018   Bilateral inguinal hernia without obstruction or gangrene 08/13/2018   Fatty liver 08/13/2018   Difficulty balancing 05/16/2017   Benign neoplasm of ascending colon    Benign neoplasm of transverse colon    Hearing loss, sensorineural, unilateral 10/21/2016   Dyslipidemia with low high density lipoprotein (HDL) cholesterol with hypertriglyceridemia due to type 2 diabetes mellitus ([place]) 09/16/2016   GAD (generalized anxiety disorder) 09/16/2016   Genital herpes 06/08/2016   Dizziness 03/09/2016   History of nonmelanoma skin cancer 09/30/2015   Neck pain 07/29/2015   Anxiety 07/19/2015   Insomnia, persistent 07/19/2015   CN (constipation) 07/19/2015   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 ([employer]) 07/19/2015   Dyslipidemia 07/19/2015   Gastric reflux 07/19/2015   History of shingles 07/19/2015   History of basal cell cancer 07/19/2015   H/O Malignant melanoma 07/19/2015   Personal history of malignant neoplasm of head and neck 07/19/2015   Chronic recurrent major depressive disorder ([place]) 07/19/2015   Obesity (BMI 30.0-34.9)  07/19/2015   ED (erectile dysfunction) 07/19/2015    Past Surgical History:  Procedure Laterality Date   ARM SKIN LESION BIOPSY / EXCISION Right 05/18/2017   COLONOSCOPY     COLONOSCOPY WITH PROPOFOL N/A 12/15/2016   Procedure: COLONOSCOPY WITH PROPOFOL;  Surgeon: Lucilla Lame, MD;  Location: [facility];  Service: Endoscopy;  Laterality: N/A;  diabetic - oral meds   COLONOSCOPY WITH PROPOFOL N/A 03/31/2020   Procedure: COLONOSCOPY WITH PROPOFOL;  Surgeon: Lucilla Lame, MD;  Location: [facility] ENDOSCOPY;  Service: Endoscopy;   Laterality: N/A;   ESOPHAGOGASTRODUODENOSCOPY (EGD) WITH PROPOFOL N/A 03/31/2020   Procedure: ESOPHAGOGASTRODUODENOSCOPY (EGD) WITH PROPOFOL;  Surgeon: Lucilla Lame, MD;  Location: [facility] ENDOSCOPY;  Service: Endoscopy;  Laterality: N/A;   NASAL SEPTUM SURGERY     POLYPECTOMY  12/15/2016   Procedure: POLYPECTOMY;  Surgeon: Lucilla Lame, MD;  Location: [place];  Service: Endoscopy;;   SENTINEL NODE BIOPSY Right 05/18/2017   [facility]    Family History  Problem Relation Age of Onset   Diabetes Mother    Heart disease Mother    Hyperlipidemia Mother    CVA Father    Cancer Father        Colon   Depression Father     Social History   Tobacco Use   Smoking status: Never   Smokeless tobacco: Never   Tobacco comments:    Smoking cessation materials not required  Substance Use Topics   Alcohol use: Yes    Alcohol/week: 0.0 standard drinks of alcohol    Comment: Holidays     Current Outpatient Medications:    ALPRAZolam (XANAX) 1 MG tablet, Take 0.5-1 tablets (0.5-1 mg total) by mouth daily as needed for anxiety., Disp: 10 tablet, Rfl: 0   atorvastatin (LIPITOR) 80 MG tablet, Take 1 tablet (80 mg total) by mouth daily., Disp: 90 tablet, Rfl: 1   buPROPion (WELLBUTRIN XL) 300 MG 24 hr tablet, Take 1 tablet (300 mg total) by mouth daily., Disp: 90 tablet, Rfl: 1   cholecalciferol (VITAMIN D3) 25 MCG (1000 UNIT) tablet, Take 1,000 Units by mouth daily., Disp: , Rfl:    docusate sodium (COLACE) 100 MG capsule, Take 1 tablet once or twice daily as needed for constipation while taking narcotic pain medicine, Disp: 30 capsule, Rfl: 0   famotidine (PEPCID) 20 MG tablet, Take 1 tablet by mouth once daily, Disp: 90 tablet, Rfl: 1   ferrous sulfate 325 (65 FE) MG EC tablet, Take 1 tablet (325 mg total) by mouth 2 (two) times daily., Disp: 180 tablet, Rfl: 0   meloxicam (MOBIC) 15 MG tablet, TAKE 1 TABLET BY MOUTH ONCE DAILY AS NEEDED FOR PAIN. TRY NOT TO TAKE DAILY, Disp: 90 tablet, Rfl: 0    metFORMIN (GLUCOPHAGE-XR) 750 MG 24 hr tablet, TAKE 2 TABLETS BY MOUTH ONCE DAILY WITH BREAKFAST, Disp: 180 tablet, Rfl: 1   omega-3 acid ethyl esters (LOVAZA) 1 g capsule, Take 2 capsules (2 g total) by mouth 2 (two) times daily., Disp: 360 capsule, Rfl: 1   omeprazole (PRILOSEC) 40 MG capsule, Take 1 capsule (40 mg total) by mouth daily., Disp: 90 capsule, Rfl: 1   pregabalin (LYRICA) 75 MG capsule, Take 1 capsule (75 mg total) by mouth 2 (two) times daily., Disp: 180 capsule, Rfl: 1   tadalafil (CIALIS) 5 MG tablet, Take 1 tablet (5 mg total) by mouth daily as needed for erectile dysfunction., Disp: 90 tablet, Rfl: 1   tamsulosin (FLOMAX) 0.4 MG CAPS capsule, Take 1  capsule (0.4 mg total) by mouth daily., Disp: 90 capsule, Rfl: 1   valACYclovir (VALTREX) 500 MG tablet, TAKE ONE CAPLET BY MOUTH THREE TIMES DAILY, Disp: 90 tablet, Rfl: 1   amphetamine-dextroamphetamine (ADDERALL) 15 MG tablet, Take 1 tablet by mouth 2 (two) times daily., Disp: 60 tablet, Rfl: 0   amphetamine-dextroamphetamine (ADDERALL) 15 MG tablet, Take 1 tablet by mouth 2 (two) times daily., Disp: 60 tablet, Rfl: 0   amphetamine-dextroamphetamine (ADDERALL) 15 MG tablet, Take 1 tablet by mouth 2 (two) times daily., Disp: 60 tablet, Rfl: 0  No Known Allergies  I personally reviewed active problem list, medication list, allergies, family history, social history, health maintenance with the patient/caregiver today.   ROS  Constitutional: Negative for fever or weight change.  Respiratory: Negative for cough and shortness of breath.   Cardiovascular: Negative for chest pain or palpitations.  Gastrointestinal: Negative for abdominal pain, no bowel changes.  Musculoskeletal: Negative for gait problem or joint swelling.  Skin: Negative for rash.  Neurological: positive  for dizziness but no  recent  headache.  No other specific complaints in a complete review of systems (except as listed in HPI above).   Objective  Vitals:    09/22/22 1144 09/22/22 1145  BP: 132/68 124/68  Pulse: 91   Resp: 16   SpO2: 96%   Weight: 213 lb (96.6 kg)   Height: '5\' 9"'$  (1.753 m)     Body mass index is 31.45 kg/m.  Physical Exam  Constitutional: Patient appears well-developed and well-nourished. Obese  No distress.  HEENT: head atraumatic, normocephalic, pupils equal and reactive to light,, neck supple Cardiovascular: Normal rate, regular rhythm and normal heart sounds.  No murmur heard. No BLE edema. Pulmonary/Chest: Effort normal and breath sounds normal. No respiratory distress. Abdominal: Soft.  There is no tenderness. Neuro:  Psychiatric: Patient has a normal mood and affect. behavior is normal. Judgment and thought content normal.   Recent Results (from the past 2160 hour(s))  HM DIABETES EYE EXAM     Status: None   Collection Time: 09/12/22 12:00 AM  Result Value Ref Range   HM Diabetic Eye Exam No Retinopathy No Retinopathy  POCT HgB A1C     Status: Abnormal   Collection Time: 09/22/22 11:46 AM  Result Value Ref Range   Hemoglobin A1C 6.2 (A) 4.0 - 5.6 %   HbA1c POC (<> result, manual entry)     HbA1c, POC (prediabetic range)     HbA1c, POC (controlled diabetic range)      Diabetic Foot Exam: Diabetic Foot Exam - Simple   Simple Foot Form Visual Inspection See comments: Yes Sensation Testing Intact to touch and monofilament testing bilaterally: Yes Pulse Check Posterior Tibialis and Dorsalis pulse intact bilaterally: Yes Comments Medial bunion on right foot, some corn formation on both feet       PHQ2/9:    09/22/2022   11:45 AM 07/18/2022   11:36 AM 06/22/2022   11:56 AM 06/13/2022    9:09 AM 03/23/2022   11:26 AM  Depression screen PHQ 2/9  Decreased Interest 0 0 0 0 1  Down, Depressed, Hopeless 0 0 0 0 1  PHQ - 2 Score 0 0 0 0 2  Altered sleeping 0 0 0 0 0  Tired, decreased energy 0 0 0 0 0  Change in appetite 0 0 0 0 0  Feeling bad or failure about yourself  0 0 0 0 0  Trouble  concentrating 0 0 1 0 0  Moving slowly or fidgety/restless 0 0 0 0 0  Suicidal thoughts 0 0 0 0 0  PHQ-9 Score 0 0 1 0 2    phq 9 is negative   Fall Risk:    09/22/2022   11:45 AM 07/18/2022   11:36 AM 06/22/2022   11:56 AM 06/13/2022    9:09 AM 03/23/2022   11:26 AM  Fall Risk   Falls in the past year? 0 0 0 0 0  Number falls in past yr: 0 0 0  0  Injury with Fall? 0 0 0  0  Risk for fall due to : No Fall Risks No Fall Risks No Fall Risks No Fall Risks No Fall Risks  Follow up Falls prevention discussed Falls prevention discussed Falls prevention discussed Falls prevention discussed;Education provided;Falls evaluation completed Falls prevention discussed      Functional Status Survey: Is the patient deaf or have difficulty hearing?: Yes Does the patient have difficulty seeing, even when wearing glasses/contacts?: No Does the patient have difficulty concentrating, remembering, or making decisions?: Yes Does the patient have difficulty walking or climbing stairs?: No Does the patient have difficulty dressing or bathing?: No Does the patient have difficulty doing errands alone such as visiting a doctor's office or shopping?: No    Assessment & Plan  1. Type 2 diabetes mellitus with diabetic neuropathy, without long-term current use of insulin (HCC)  - POCT HgB A1C - HM Diabetes Foot Exam  2. Atherosclerosis of aorta ([place])  On statin therapy   3. Chronic recurrent major depressive disorder (HCC)  - amphetamine-dextroamphetamine (ADDERALL) 15 MG tablet; Take 1 tablet by mouth 2 (two) times daily.  Dispense: 60 tablet; Refill: 0 - amphetamine-dextroamphetamine (ADDERALL) 15 MG tablet; Take 1 tablet by mouth 2 (two) times daily.  Dispense: 60 tablet; Refill: 0 - amphetamine-dextroamphetamine (ADDERALL) 15 MG tablet; Take 1 tablet by mouth 2 (two) times daily.  Dispense: 60 tablet; Refill: 0  4. Need for immunization against influenza  - Flu Vaccine QUAD High  Dose(Fluad)  5. Dizziness after extension of neck  He will think about imaging and or referral to neurologist and he wants to hold off for now   6. Gastroesophageal reflux disease without esophagitis  Controlled  7. Chronic calcific pancreatitis (HCC)   Asymptomatic

## 2022-09-22 ENCOUNTER — Encounter: Payer: Self-pay | Admitting: Family Medicine

## 2022-09-22 ENCOUNTER — Ambulatory Visit: Payer: Medicare PPO | Admitting: Family Medicine

## 2022-09-22 VITALS — BP 124/68 | HR 91 | Resp 16 | Ht 69.0 in | Wt 213.0 lb

## 2022-09-22 DIAGNOSIS — E1140 Type 2 diabetes mellitus with diabetic neuropathy, unspecified: Secondary | ICD-10-CM

## 2022-09-22 DIAGNOSIS — K219 Gastro-esophageal reflux disease without esophagitis: Secondary | ICD-10-CM

## 2022-09-22 DIAGNOSIS — R42 Dizziness and giddiness: Secondary | ICD-10-CM | POA: Diagnosis not present

## 2022-09-22 DIAGNOSIS — K861 Other chronic pancreatitis: Secondary | ICD-10-CM | POA: Diagnosis not present

## 2022-09-22 DIAGNOSIS — Z23 Encounter for immunization: Secondary | ICD-10-CM

## 2022-09-22 DIAGNOSIS — F339 Major depressive disorder, recurrent, unspecified: Secondary | ICD-10-CM

## 2022-09-22 DIAGNOSIS — I700 Atherosclerosis of aorta: Secondary | ICD-10-CM

## 2022-09-22 LAB — POCT GLYCOSYLATED HEMOGLOBIN (HGB A1C): Hemoglobin A1C: 6.2 % — AB (ref 4.0–5.6)

## 2022-09-22 MED ORDER — AMPHETAMINE-DEXTROAMPHETAMINE 15 MG PO TABS: 15.0000 mg | ORAL_TABLET | Freq: Two times a day (BID) | ORAL | 0 refills | Status: DC

## 2022-09-24 ENCOUNTER — Other Ambulatory Visit: Payer: Self-pay | Admitting: Family Medicine

## 2022-09-24 DIAGNOSIS — E1140 Type 2 diabetes mellitus with diabetic neuropathy, unspecified: Secondary | ICD-10-CM

## 2022-09-24 DIAGNOSIS — N401 Enlarged prostate with lower urinary tract symptoms: Secondary | ICD-10-CM

## 2022-11-08 ENCOUNTER — Ambulatory Visit (LOCAL_COMMUNITY_HEALTH_CENTER): Payer: Medicare PPO

## 2022-11-08 DIAGNOSIS — Z719 Counseling, unspecified: Secondary | ICD-10-CM

## 2022-11-08 DIAGNOSIS — Z23 Encounter for immunization: Secondary | ICD-10-CM

## 2022-11-08 NOTE — Progress Notes (Signed)
  Are you feeling sick today? No   Have you ever received a dose of COVID-19 Vaccine? [employer], [place], [place], [place], Other) Yes  If yes, which vaccine and how many doses?   5 doses Pfizer   Did you bring the vaccination record card or other documentation?  Yes   Do you have a health condition or are undergoing treatment that makes you moderately or severely immunocompromised? This would include, but not be limited to: cancer, HIV, organ transplant, immunosuppressive therapy/high-dose corticosteroids, or moderate/severe primary immunodeficiency.  No  Have you received COVID-19 vaccine before or during hematopoietic cell transplant (HCT) or CAR-T-cell therapies? No  Have you ever had an allergic reaction to: (This would include a severe allergic reaction or a reaction that caused hives, swelling, or respiratory distress, including wheezing.) A component of a COVID-19 vaccine or a previous dose of COVID-19 vaccine? No   Have you ever had an allergic reaction to another vaccine (other thanCOVID-19 vaccine) or an injectable medication? (This would include a severe allergic reaction or a reaction that caused hives, swelling, or respiratory distress, including wheezing.)   No    Do you have a history of any of the following:  Myocarditis or Pericarditis No  Dermal fillers:  No  Multisystem Inflammatory Syndrome (MIS-C or MIS-A)? No  COVID-19 disease within the past 3 months? No  Vaccinated with monkeypox vaccine in the last 4 weeks? No  VIS provided. Comirnaty +12Y given in right deltoid. Tolerated well. COVID card updated.  [facility] updated and copy provided. Waited 15 minutes.

## 2022-12-11 ENCOUNTER — Other Ambulatory Visit: Payer: Self-pay | Admitting: Family Medicine

## 2022-12-11 DIAGNOSIS — N401 Enlarged prostate with lower urinary tract symptoms: Secondary | ICD-10-CM

## 2022-12-21 NOTE — Progress Notes (Signed)
Name: Shane Boyd.   MRN: [id number]    DOB: [birth year]   Date:12/22/2022       Progress Note  Subjective  Chief Complaint  Follow Up  HPI  Dizziness: he still feels lightheaded when he stands up quickly and that has been stable, however he has noticed increase in dizziness sometimes with vertigo when he reaches out / looking up . Discussed vertebral artery problems,  we discussed imaging like CTA and also discussed referral to neurologist on his last visit but no episodes over the last month. He will continue to monitor it for now   GERD: he switched to Omeprazole at night and H2 blocker in am's . He is doing well with current regiment   Groin pain:  We discussed possible OA and referral to Ortho but we decided to try  Meloxicam and changed shoe wear  ( [facility])  He states knee pain resolved, no longer using knee brace of topical medication since he is doing well   Chronic calcified pancreatitis: found on CT abdomen done for evaluation of diverticulitis. No symptoms and lipase normal in the past He has remained asymptomatic    Dyslipidemia/Atherosclerosis of Aorta : he is taking Lovaza BID and atorvastatin, he denies  myalgia. Last LDL was 46 , he is also taking aspirin 81 mg.Continue medications recheck labs yearly  He is compliant with medication  DMII with ED: he is taking Metformin 1500 mg daily, he  denies polyphagia, polydipsia or polyuria. He states Lyrica is working very well for neuropathy,he is on Cialis for ED , A1C has been controlled today is down 6.2 % to 6.1 %   Major Depressive Disorder/GAD  he is doing better, currently on Wellbutrin, Adderal, he takes alprazolam 1 mg prn. HIs mood has been stable   He is taking Alprazolam prn last rx for 10 was given July  2023 and he would like a refill today   Hearing loss: he has been to audiologist, he has tried otc hearing aids but it did not seem to work . Unchanged    BPH: currently doing well on cialis an flomax, he states  once he started medication his urinary urgency resolved, nocturia only once per night, last PSA was 1.5 and at goal  , we will recheck during his CPE  Patient Active Problem List   Diagnosis Date Noted   Chronic calcific pancreatitis ([place]) 03/23/2022   Iron deficiency anemia due to chronic blood loss    Polyp of ascending colon    Type 2 diabetes mellitus with pressure callus ([place]) 10/17/2018   Right ureteral stone 08/15/2018   Bilateral inguinal hernia without obstruction or gangrene 08/13/2018   Fatty liver 08/13/2018   Difficulty balancing 05/16/2017   Benign neoplasm of ascending colon    Benign neoplasm of transverse colon    Hearing loss, sensorineural, unilateral 10/21/2016   Dyslipidemia with low high density lipoprotein (HDL) cholesterol with hypertriglyceridemia due to type 2 diabetes mellitus ([place]) 09/16/2016   GAD (generalized anxiety disorder) 09/16/2016   Genital herpes 06/08/2016   Dizziness 03/09/2016   History of nonmelanoma skin cancer 09/30/2015   Neck pain 07/29/2015   Anxiety 07/19/2015   Insomnia, persistent 07/19/2015   CN (constipation) 07/19/2015   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 ([place]) 07/19/2015   Dyslipidemia 07/19/2015   Gastric reflux 07/19/2015   History of shingles 07/19/2015   History of basal cell cancer 07/19/2015   H/O Malignant melanoma 07/19/2015   Personal history of  malignant neoplasm of head and neck 07/19/2015   Chronic recurrent major depressive disorder ([place]) 07/19/2015   Obesity (BMI 30.0-34.9) 07/19/2015   ED (erectile dysfunction) 07/19/2015    Past Surgical History:  Procedure Laterality Date   ARM SKIN LESION BIOPSY / EXCISION Right 05/18/2017   COLONOSCOPY     COLONOSCOPY WITH PROPOFOL N/A 12/15/2016   Procedure: COLONOSCOPY WITH PROPOFOL;  Surgeon: Lucilla Lame, MD;  Location: [place];  Service: Endoscopy;  Laterality: N/A;  diabetic - oral meds   COLONOSCOPY WITH PROPOFOL N/A  03/31/2020   Procedure: COLONOSCOPY WITH PROPOFOL;  Surgeon: Lucilla Lame, MD;  Location[facility] ENDOSCOPY;  Service: Endoscopy;  Laterality: N/A;   ESOPHAGOGASTRODUODENOSCOPY (EGD) WITH PROPOFOL N/A 03/31/2020   Procedure: ESOPHAGOGASTRODUODENOSCOPY (EGD) WITH PROPOFOL;  Surgeon: Lucilla Lame, MD;  Location: [facility] ENDOSCOPY;  Service: Endoscopy;  Laterality: N/A;   NASAL SEPTUM SURGERY     POLYPECTOMY  12/15/2016   Procedure: POLYPECTOMY;  Surgeon: Lucilla Lame, MD;  Location: [place];  Service: Endoscopy;;   SENTINEL NODE BIOPSY Right 05/18/2017   [facility]    Family History  Problem Relation Age of Onset   Diabetes Mother    Heart disease Mother    Hyperlipidemia Mother    CVA Father    Cancer Father        Colon   Depression Father     Social History   Tobacco Use   Smoking status: Never   Smokeless tobacco: Never   Tobacco comments:    Smoking cessation materials not required  Substance Use Topics   Alcohol use: Yes    Alcohol/week: 0.0 standard drinks of alcohol    Comment: Holidays     Current Outpatient Medications:    ALPRAZolam (XANAX) 1 MG tablet, Take 0.5-1 tablets (0.5-1 mg total) by mouth daily as needed for anxiety., Disp: 10 tablet, Rfl: 0   amphetamine-dextroamphetamine (ADDERALL) 15 MG tablet, Take 1 tablet by mouth 2 (two) times daily., Disp: 60 tablet, Rfl: 0   amphetamine-dextroamphetamine (ADDERALL) 15 MG tablet, Take 1 tablet by mouth 2 (two) times daily., Disp: 60 tablet, Rfl: 0   amphetamine-dextroamphetamine (ADDERALL) 15 MG tablet, Take 1 tablet by mouth 2 (two) times daily., Disp: 60 tablet, Rfl: 0   atorvastatin (LIPITOR) 80 MG tablet, Take 1 tablet (80 mg total) by mouth daily., Disp: 90 tablet, Rfl: 1   buPROPion (WELLBUTRIN XL) 300 MG 24 hr tablet, Take 1 tablet (300 mg total) by mouth daily., Disp: 90 tablet, Rfl: 1   cholecalciferol (VITAMIN D3) 25 MCG (1000 UNIT) tablet, Take 1,000 Units by mouth daily., Disp: , Rfl:    docusate sodium  (COLACE) 100 MG capsule, Take 1 tablet once or twice daily as needed for constipation while taking narcotic pain medicine, Disp: 30 capsule, Rfl: 0   famotidine (PEPCID) 20 MG tablet, Take 1 tablet by mouth once daily, Disp: 90 tablet, Rfl: 1   ferrous sulfate 325 (65 FE) MG EC tablet, Take 1 tablet (325 mg total) by mouth 2 (two) times daily., Disp: 180 tablet, Rfl: 0   meloxicam (MOBIC) 15 MG tablet, TAKE 1 TABLET BY MOUTH ONCE DAILY AS NEEDED FOR PAIN. TRY NOT TO TAKE DAILY, Disp: 90 tablet, Rfl: 0   metFORMIN (GLUCOPHAGE-XR) 750 MG 24 hr tablet, TAKE 2 TABLETS BY MOUTH ONCE DAILY WITH BREAKFAST, Disp: 180 tablet, Rfl: 1   omega-3 acid ethyl esters (LOVAZA) 1 g capsule, Take 2 capsules (2 g total) by mouth 2 (two) times daily., Disp: 360  capsule, Rfl: 1   omeprazole (PRILOSEC) 40 MG capsule, Take 1 capsule (40 mg total) by mouth daily., Disp: 90 capsule, Rfl: 1   pregabalin (LYRICA) 75 MG capsule, Take 1 capsule (75 mg total) by mouth 2 (two) times daily., Disp: 180 capsule, Rfl: 1   tadalafil (CIALIS) 5 MG tablet, TAKE 1 TABLET BY MOUTH ONCE DAILY AS NEEDED FOR ERECTILE DYSFUNCTION, Disp: 90 tablet, Rfl: 0   tamsulosin (FLOMAX) 0.4 MG CAPS capsule, Take 1 capsule by mouth once daily, Disp: 90 capsule, Rfl: 0   valACYclovir (VALTREX) 500 MG tablet, TAKE ONE CAPLET BY MOUTH THREE TIMES DAILY, Disp: 90 tablet, Rfl: 1  No Known Allergies  I personally reviewed active problem list, medication list, allergies, family history, social history, health maintenance with the patient/caregiver today.   ROS  Constitutional: Negative for fever or weight change.  Respiratory: Negative for cough and shortness of breath.   Cardiovascular: Negative for chest pain or palpitations.  Gastrointestinal: Negative for abdominal pain, no bowel changes.  Musculoskeletal: Negative for gait problem or joint swelling.  Skin: Negative for rash.  Neurological: Negative for dizziness or headache.  No other specific  complaints in a complete review of systems (except as listed in HPI above).   Objective  Vitals:   12/22/22 1143  BP: 124/70  Pulse: 92  Resp: 16  SpO2: 96%  Weight: 217 lb (98.4 kg)  Height: '5\' 9"'$  (1.753 m)    Body mass index is 32.05 kg/m.  Physical Exam  Constitutional: Patient appears well-developed and well-nourished. Obese  No distress.  HEENT: head atraumatic, normocephalic, pupils equal and reactive to light, neck supple Cardiovascular: Normal rate, regular rhythm and normal heart sounds.  No murmur heard. No BLE edema. Pulmonary/Chest: Effort normal and breath sounds normal. No respiratory distress. Abdominal: Soft.  There is no tenderness. Psychiatric: Patient has a normal mood and affect. behavior is normal. Judgment and thought content normal.    PHQ2/9:    12/22/2022   11:44 AM 09/22/2022   11:45 AM 07/18/2022   11:36 AM 06/22/2022   11:56 AM 06/13/2022    9:09 AM  Depression screen PHQ 2/9  Decreased Interest 0 0 0 0 0  Down, Depressed, Hopeless 0 0 0 0 0  PHQ - 2 Score 0 0 0 0 0  Altered sleeping 0 0 0 0 0  Tired, decreased energy 0 0 0 0 0  Change in appetite 0 0 0 0 0  Feeling bad or failure about yourself  0 0 0 0 0  Trouble concentrating 0 0 0 1 0  Moving slowly or fidgety/restless 0 0 0 0 0  Suicidal thoughts 0 0 0 0 0  PHQ-9 Score 0 0 0 1 0    phq 9 is negative   Fall Risk:    12/22/2022   11:44 AM 09/22/2022   11:45 AM 07/18/2022   11:36 AM 06/22/2022   11:56 AM 06/13/2022    9:09 AM  Fall Risk   Falls in the past year? 0 0 0 0 0  Number falls in past yr: 0 0 0 0   Injury with Fall? 0 0 0 0   Risk for fall due to : No Fall Risks No Fall Risks No Fall Risks No Fall Risks No Fall Risks  Follow up Falls prevention discussed Falls prevention discussed Falls prevention discussed Falls prevention discussed Falls prevention discussed;Education provided;Falls evaluation completed      Functional Status Survey: Is the patient deaf or  have  difficulty hearing?: No Does the patient have difficulty seeing, even when wearing glasses/contacts?: No Does the patient have difficulty concentrating, remembering, or making decisions?: No Does the patient have difficulty walking or climbing stairs?: Yes Does the patient have difficulty dressing or bathing?: No Does the patient have difficulty doing errands alone such as visiting a doctor's office or shopping?: No    Assessment & Plan  1. Type 2 diabetes mellitus with diabetic neuropathy, without long-term current use of insulin (HCC)  - POCT HgB A1C - pregabalin (LYRICA) 75 MG capsule; Take 1 capsule (75 mg total) by mouth 2 (two) times daily.  Dispense: 180 capsule; Refill: 1  2. Atherosclerosis of aorta ([place])  On statin therapy   3. Chronic recurrent major depressive disorder (HCC)  - amphetamine-dextroamphetamine (ADDERALL) 15 MG tablet; Take 1 tablet by mouth 2 (two) times daily.  Dispense: 60 tablet; Refill: 0 - amphetamine-dextroamphetamine (ADDERALL) 15 MG tablet; Take 1 tablet by mouth 2 (two) times daily.  Dispense: 60 tablet; Refill: 0 - amphetamine-dextroamphetamine (ADDERALL) 15 MG tablet; Take 1 tablet by mouth 2 (two) times daily.  Dispense: 60 tablet; Refill: 0 - buPROPion (WELLBUTRIN XL) 300 MG 24 hr tablet; Take 1 tablet (300 mg total) by mouth daily.  Dispense: 90 tablet; Refill: 1  4. Chronic calcific pancreatitis (HCC)  Doing well, no symptoms at this time  5. Chronic recurrent major depressive disorder (HCC)  - amphetamine-dextroamphetamine (ADDERALL) 15 MG tablet; Take 1 tablet by mouth 2 (two) times daily.  Dispense: 60 tablet; Refill: 0 - amphetamine-dextroamphetamine (ADDERALL) 15 MG tablet; Take 1 tablet by mouth 2 (two) times daily.  Dispense: 60 tablet; Refill: 0 - amphetamine-dextroamphetamine (ADDERALL) 15 MG tablet; Take 1 tablet by mouth 2 (two) times daily.  Dispense: 60 tablet; Refill: 0 - buPROPion (WELLBUTRIN XL) 300 MG 24 hr tablet; Take 1  tablet (300 mg total) by mouth daily.  Dispense: 90 tablet; Refill: 1  6. GAD (generalized anxiety disorder)  - ALPRAZolam (XANAX) 1 MG tablet; Take 0.5-1 tablets (0.5-1 mg total) by mouth daily as needed for anxiety.  Dispense: 10 tablet; Refill: 0  7. Dyslipidemia  - atorvastatin (LIPITOR) 80 MG tablet; Take 1 tablet (80 mg total) by mouth daily.  Dispense: 90 tablet; Refill: 1  8. Gastric reflux  - famotidine (PEPCID) 20 MG tablet; Take 1 tablet (20 mg total) by mouth daily.  Dispense: 90 tablet; Refill: 1  9. 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  Dispense: 90 tablet; Refill: 1  10. Dyslipidemia with low high density lipoprotein (HDL) cholesterol 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)  - omega-3 acid ethyl esters (LOVAZA) 1 g capsule; Take 2 capsules (2 g total) by mouth 2 (two) times daily.  Dispense: 360 capsule; Refill: 1  11. Gastroesophageal reflux disease without esophagitis  - omeprazole (PRILOSEC) 40 MG capsule; Take 1 capsule (40 mg total) by mouth daily.  Dispense: 90 capsule; Refill: 1  12. Genital herpes simplex, unspecified site  - valACYclovir (VALTREX) 500 MG tablet; TAKE ONE CAPLET BY MOUTH THREE TIMES DAILY  Dispense: 90 tablet; Refill: 1  13. BPH associated with nocturia  - tamsulosin (FLOMAX) 0.4 MG CAPS capsule; Take 1 capsule (0.4 mg total) by  mouth daily.  Dispense: 90 capsule; Refill: 1 - tadalafil (CIALIS) 5 MG tablet; Take 1 tablet (5 mg total) by mouth daily as needed for erectile dysfunction.  Dispense: 90 tablet; Refill: 1

## 2022-12-22 ENCOUNTER — Encounter: Payer: Self-pay | Admitting: Family Medicine

## 2022-12-22 ENCOUNTER — Ambulatory Visit: Payer: Medicare PPO | Admitting: Family Medicine

## 2022-12-22 VITALS — BP 124/70 | HR 92 | Resp 16 | Ht 69.0 in | Wt 217.0 lb

## 2022-12-22 DIAGNOSIS — N521 Erectile dysfunction due to diseases classified elsewhere: Secondary | ICD-10-CM

## 2022-12-22 DIAGNOSIS — K861 Other chronic pancreatitis: Secondary | ICD-10-CM

## 2022-12-22 DIAGNOSIS — A6000 Herpesviral infection of urogenital system, unspecified: Secondary | ICD-10-CM

## 2022-12-22 DIAGNOSIS — E1169 Type 2 diabetes mellitus with other specified complication: Secondary | ICD-10-CM

## 2022-12-22 DIAGNOSIS — K219 Gastro-esophageal reflux disease without esophagitis: Secondary | ICD-10-CM

## 2022-12-22 DIAGNOSIS — F411 Generalized anxiety disorder: Secondary | ICD-10-CM | POA: Diagnosis not present

## 2022-12-22 DIAGNOSIS — E1140 Type 2 diabetes mellitus with diabetic neuropathy, unspecified: Secondary | ICD-10-CM | POA: Diagnosis not present

## 2022-12-22 DIAGNOSIS — R351 Nocturia: Secondary | ICD-10-CM

## 2022-12-22 DIAGNOSIS — F339 Major depressive disorder, recurrent, unspecified: Secondary | ICD-10-CM | POA: Diagnosis not present

## 2022-12-22 DIAGNOSIS — N401 Enlarged prostate with lower urinary tract symptoms: Secondary | ICD-10-CM

## 2022-12-22 DIAGNOSIS — I700 Atherosclerosis of aorta: Secondary | ICD-10-CM | POA: Diagnosis not present

## 2022-12-22 DIAGNOSIS — E782 Mixed hyperlipidemia: Secondary | ICD-10-CM

## 2022-12-22 DIAGNOSIS — E785 Hyperlipidemia, unspecified: Secondary | ICD-10-CM

## 2022-12-22 LAB — POCT GLYCOSYLATED HEMOGLOBIN (HGB A1C): Hemoglobin A1C: 6.1 % — AB (ref 4.0–5.6)

## 2022-12-22 MED ORDER — OMEPRAZOLE 40 MG PO CPDR: 40.0000 mg | DELAYED_RELEASE_CAPSULE | Freq: Every day | ORAL | 1 refills | Status: DC

## 2022-12-22 MED ORDER — PREGABALIN 75 MG PO CAPS: 75.0000 mg | ORAL_CAPSULE | Freq: Two times a day (BID) | ORAL | 1 refills | Status: DC

## 2022-12-22 MED ORDER — OMEGA-3-ACID ETHYL ESTERS 1 G PO CAPS: 2.0000 g | ORAL_CAPSULE | Freq: Two times a day (BID) | ORAL | 1 refills | Status: DC

## 2022-12-22 MED ORDER — METFORMIN HCL ER 750 MG PO TB24: ORAL_TABLET | ORAL | 1 refills | Status: DC

## 2022-12-22 MED ORDER — AMPHETAMINE-DEXTROAMPHETAMINE 15 MG PO TABS: 15.0000 mg | ORAL_TABLET | Freq: Two times a day (BID) | ORAL | 0 refills | Status: DC

## 2022-12-22 MED ORDER — FAMOTIDINE 20 MG PO TABS: 20.0000 mg | ORAL_TABLET | Freq: Every day | ORAL | 1 refills | Status: DC

## 2022-12-22 MED ORDER — ATORVASTATIN CALCIUM 80 MG PO TABS: 80.0000 mg | ORAL_TABLET | Freq: Every day | ORAL | 1 refills | Status: DC

## 2022-12-22 MED ORDER — TADALAFIL 5 MG PO TABS: 5.0000 mg | ORAL_TABLET | Freq: Every day | ORAL | 1 refills | Status: DC | PRN

## 2022-12-22 MED ORDER — ALPRAZOLAM 1 MG PO TABS: 0.5000 mg | ORAL_TABLET | Freq: Every day | ORAL | 0 refills | Status: DC | PRN

## 2022-12-22 MED ORDER — BUPROPION HCL ER (XL) 300 MG PO TB24: 300.0000 mg | ORAL_TABLET | Freq: Every day | ORAL | 1 refills | Status: DC

## 2022-12-22 MED ORDER — VALACYCLOVIR HCL 500 MG PO TABS: ORAL_TABLET | ORAL | 1 refills | Status: AC

## 2022-12-22 MED ORDER — TAMSULOSIN HCL 0.4 MG PO CAPS: 0.4000 mg | ORAL_CAPSULE | Freq: Every day | ORAL | 1 refills | Status: DC

## 2023-03-30 ENCOUNTER — Ambulatory Visit: Payer: Medicare PPO | Admitting: Family Medicine

## 2023-04-05 ENCOUNTER — Encounter: Payer: Self-pay | Admitting: Family Medicine

## 2023-04-05 ENCOUNTER — Other Ambulatory Visit: Payer: Self-pay | Admitting: Family Medicine

## 2023-04-05 DIAGNOSIS — F339 Major depressive disorder, recurrent, unspecified: Secondary | ICD-10-CM

## 2023-04-05 MED ORDER — AMPHETAMINE-DEXTROAMPHETAMINE 15 MG PO TABS: 15.0000 mg | ORAL_TABLET | Freq: Two times a day (BID) | ORAL | 0 refills | Status: DC

## 2023-04-10 NOTE — Progress Notes (Unsigned)
Name: Shane Boyd.   MRN: [id number]    DOB: Jan 13, 1951   Date:04/11/2023       Progress Note  Subjective  Chief Complaint  Follow Up  HPI  Dizziness: he still feels lightheaded when he stands up quickly and that has been stable, however he has noticed increase in dizziness sometimes with vertigo when he reaches out / looking up . Discussed vertebral artery problems,  we discussed imaging like CTA and also discussed referral to neurologist on his last visit but no episodes over the last month. We will continue to monitor   GERD: he switched to Omeprazole at night and H2 blocker in am's . He is doing well with current regiment , he states avoids onions and other spicy foods, sometimes takes omeprazole at dinner.   Groin pain:  We discussed possible OA and referral to Ortho but we decided to try  Meloxicam and changed shoe wear  ( OOFOS)  He states knee pain resolved, no longer using knee brace of topical medication since he is doing well We will send a refill for Meloxicam   Chronic calcified pancreatitis: found on CT abdomen done for evaluation of diverticulitis. No symptoms and lipase normal in the past. Unchanged    Dyslipidemia/Atherosclerosis of Aorta : he is taking Lovaza BID and atorvastatin, he denies  myalgia. Last LDL was 46 , he is also taking aspirin 81 mg.Continue medications , we will recheck labs today   DMII with ED: he is taking Metformin 1500 mg daily, he  denies polyphagia, polydipsia or polyuria. He states Lyrica is working very well for neuropathy,he is on Cialis for ED , A1C today is 6.2 % and stable.   Major Depressive Disorder/GAD  he is doing better, currently on Wellbutrin, Adderal, he takes alprazolam 1 mg prn.  He is taking Alprazolam prn last rx for 10 - last fill was January 2024. April is a hard month for him but stable now   Hearing loss: he has been to audiologist, he has tried otc hearing aids but it did not seem to work . Stable    BPH: currently doing  well on cialis an flomax, he states once he started medication his urinary urgency resolved, nocturia only once per night, last PSA was 1.5 and at goal we will recheck labs today   Patient Active Problem List   Diagnosis Date Noted   Atherosclerosis of aorta (HCC) 12/22/2022   Chronic calcific pancreatitis (HCC) 03/23/2022   Iron deficiency anemia due to chronic blood loss    Polyp of ascending colon    Type 2 diabetes mellitus with pressure callus (HCC) 10/17/2018   Right ureteral stone 08/15/2018   Bilateral inguinal hernia without obstruction or gangrene 08/13/2018   Fatty liver 08/13/2018   Difficulty balancing 05/16/2017   Benign neoplasm of ascending colon    Benign neoplasm of transverse colon    Hearing loss, sensorineural, unilateral 10/21/2016   Dyslipidemia with low high density lipoprotein (HDL) cholesterol 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) 09/16/2016   GAD (generalized anxiety disorder) 09/16/2016   Genital herpes 06/08/2016   Dizziness 03/09/2016   History of nonmelanoma skin cancer 09/30/2015   Neck pain 07/29/2015   Anxiety 07/19/2015   CN (constipation) 07/19/2015   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) 07/19/2015   Dyslipidemia 07/19/2015   Gastric reflux 07/19/2015   History of shingles 07/19/2015   History of basal cell cancer 07/19/2015   H/O Malignant melanoma  07/19/2015   Personal history of malignant neoplasm of head and neck 07/19/2015   Chronic recurrent major depressive disorder (HCC) 07/19/2015   Obesity (BMI 30.0-34.9) 07/19/2015   ED (erectile dysfunction) 07/19/2015    Past Surgical History:  Procedure Laterality Date   ARM SKIN LESION BIOPSY / EXCISION Right 05/18/2017   COLONOSCOPY     COLONOSCOPY WITH PROPOFOL N/A 12/15/2016   Procedure: COLONOSCOPY WITH PROPOFOL;  Surgeon: Midge Minium, MD;  Location: [facility];  Service: Endoscopy;  Laterality: N/A;  diabetic - oral meds    COLONOSCOPY WITH PROPOFOL N/A 03/31/2020   Procedure: COLONOSCOPY WITH PROPOFOL;  Surgeon: Midge Minium, MD;  Location: [facility] END[facility]COPY;  Service: Endoscopy;  Laterality: N/A;   ESOPHAGOGASTRODUODENOSCOPY (EGD) WITH PROPOFOL N/A 03/31/2020   Procedure: ESOPHAGOGASTRODUODENOSCOPY (EGD) WITH PROPOFOL;  Surgeon: Midge Minium, MD;  Location: [facility] ENDOSCOPY;  Service: Endoscopy;  Laterality: N/A;   NASAL SEPTUM SURGERY     POLYPECTOMY  12/15/2016   Procedure: POLYPECTOMY;  Surgeon: Midge Minium, MD;  Location: [facility];  Service: Endoscopy;;   SENTINEL NODE BIOPSY Right 05/18/2017   [facility]    Family History  Problem Relation Age of Onset   Diabetes Mother    Heart disease Mother    Hyperlipidemia Mother    CVA Father    Cancer Father        Colon   Depression Father     Social History   Tobacco Use   Smoking status: Never   Smokeless tobacco: Never   Tobacco comments:    Smoking cessation materials not required  Substance Use Topics   Alcohol use: Yes    Alcohol/week: 0.0 standard drinks of alcohol    Comment: Holidays     Current Outpatient Medications:    ALPRAZolam (XANAX) 1 MG tablet, Take 0.5-1 tablets (0.5-1 mg total) by mouth daily as needed for anxiety., Disp: 10 tablet, Rfl: 0   amphetamine-dextroamphetamine (ADDERALL) 15 MG tablet, Take 1 tablet by mouth 2 (two) times daily., Disp: 60 tablet, Rfl: 0   amphetamine-dextroamphetamine (ADDERALL) 15 MG tablet, Take 1 tablet by mouth 2 (two) times daily., Disp: 60 tablet, Rfl: 0   amphetamine-dextroamphetamine (ADDERALL) 15 MG tablet, Take 1 tablet by mouth 2 (two) times daily., Disp: 60 tablet, Rfl: 0   atorvastatin (LIPITOR) 80 MG tablet, Take 1 tablet (80 mg total) by mouth daily., Disp: 90 tablet, Rfl: 1   buPROPion (WELLBUTRIN XL) 300 MG 24 hr tablet, Take 1 tablet (300 mg total) by mouth daily., Disp: 90 tablet, Rfl: 1   cholecalciferol (VITAMIN D3) 25 MCG (1000 UNIT) tablet, Take 1,000 Units by mouth daily.,  Disp: , Rfl:    docusate sodium (COLACE) 100 MG capsule, Take 1 tablet once or twice daily as needed for constipation while taking narcotic pain medicine, Disp: 30 capsule, Rfl: 0   famotidine (PEPCID) 20 MG tablet, Take 1 tablet (20 mg total) by mouth daily., Disp: 90 tablet, Rfl: 1   ferrous sulfate 325 (65 FE) MG EC tablet, Take 1 tablet (325 mg total) by mouth 2 (two) times daily., Disp: 180 tablet, Rfl: 0   meloxicam (MOBIC) 15 MG tablet, TAKE 1 TABLET BY MOUTH ONCE DAILY AS NEEDED FOR PAIN. TRY NOT TO TAKE DAILY, Disp: 90 tablet, Rfl: 0   metFORMIN (GLUCOPHAGE-XR) 750 MG 24 hr tablet, TAKE 2 TABLETS BY MOUTH ONCE DAILY WITH BREAKFAST, Disp: 180 tablet, Rfl: 1   omega-3 acid ethyl esters (LOVAZA) 1 g capsule, Take 2 capsules (2 g total)  by mouth 2 (two) times daily., Disp: 360 capsule, Rfl: 1   omeprazole (PRILOSEC) 40 MG capsule, Take 1 capsule (40 mg total) by mouth daily., Disp: 90 capsule, Rfl: 1   pregabalin (LYRICA) 75 MG capsule, Take 1 capsule (75 mg total) by mouth 2 (two) times daily., Disp: 180 capsule, Rfl: 1   tadalafil (CIALIS) 5 MG tablet, Take 1 tablet (5 mg total) by mouth daily as needed for erectile dysfunction., Disp: 90 tablet, Rfl: 1   tamsulosin (FLOMAX) 0.4 MG CAPS capsule, Take 1 capsule (0.4 mg total) by mouth daily., Disp: 90 capsule, Rfl: 1   valACYclovir (VALTREX) 500 MG tablet, TAKE ONE CAPLET BY MOUTH THREE TIMES DAILY, Disp: 90 tablet, Rfl: 1  No Known Allergies  I personally reviewed active problem list, medication list, allergies, family history, social history, health maintenance with the patient/caregiver today.   ROS  Ten systems reviewed and is negative except as mentioned in HPI   Objective  Vitals:   04/11/23 0957  BP: 118/70  Pulse: 69  Resp: 16  Temp: 97.7 F (36.5 C)  TempSrc: Oral  SpO2: 97%  Weight: 220 lb 14.4 oz (100.2 kg)  Height: 5\' 9"  (1.753 m)    Body mass index is 32.62 kg/m.  Physical Exam  Constitutional: Patient  appears well-developed and well-nourished. Obese  No distress.  HEENT: head atraumatic, normocephalic, pupils equal and reactive to light, neck supple Cardiovascular: Normal rate, regular rhythm and normal heart sounds.  No murmur heard. No BLE edema. Pulmonary/Chest: Effort normal and breath sounds normal. No respiratory distress. Abdominal: Soft.  There is no tenderness. Psychiatric: Patient has a normal mood and affect. behavior is normal. Judgment and thought content normal.   Recent Results (from the past 2160 hour(s))  POCT HgB A1C     Status: Abnormal   Collection Time: 04/11/23 10:05 AM  Result Value Ref Range   Hemoglobin A1C 6.2 (A) 4.0 - 5.6 %   HbA1c POC (<> result, manual entry)     HbA1c, POC (prediabetic range)     HbA1c, POC (controlled diabetic range)       PHQ2/9:    04/11/2023   10:04 AM 12/22/2022   11:44 AM 09/22/2022   11:45 AM 07/18/2022   11:36 AM 06/22/2022   11:56 AM  Depression screen PHQ 2/9  Decreased Interest 0 0 0 0 0  Down, Depressed, Hopeless 0 0 0 0 0  PHQ - 2 Score 0 0 0 0 0  Altered sleeping 0 0 0 0 0  Tired, decreased energy 0 0 0 0 0  Change in appetite 0 0 0 0 0  Feeling bad or failure about yourself  0 0 0 0 0  Trouble concentrating 0 0 0 0 1  Moving slowly or fidgety/restless 0 0 0 0 0  Suicidal thoughts 0 0 0 0 0  PHQ-9 Score 0 0 0 0 1    phq 9 is negative   Fall Risk:    04/11/2023    9:56 AM 12/22/2022   11:44 AM 09/22/2022   11:45 AM 07/18/2022   11:36 AM 06/22/2022   11:56 AM  Fall Risk   Falls in the past year? 0 0 0 0 0  Number falls in past yr:  0 0 0 0  Injury with Fall?  0 0 0 0  Risk for fall due to : No Fall Risks No Fall Risks No Fall Risks No Fall Risks No Fall Risks  Follow up Falls  prevention discussed Falls prevention discussed Falls prevention discussed Falls prevention discussed Falls prevention discussed      Functional Status Survey: Is the patient deaf or have difficulty hearing?: No Does the patient  have difficulty seeing, even when wearing glasses/contacts?: No Does the patient have difficulty concentrating, remembering, or making decisions?: No Does the patient have difficulty walking or climbing stairs?: No Does the patient have difficulty dressing or bathing?: No Does the patient have difficulty doing errands alone such as visiting a doctor's office or shopping?: No    Assessment & Plan  1. Type 2 diabetes mellitus with diabetic neuropathy, without long-term current use of insulin (HCC)  - COMPLETE METABOLIC PANEL WITH GFR - Urine Microalbumin w/creat. ratio - POCT HgB A1C - Lipid Profile - metFORMIN (GLUCOPHAGE-XR) 750 MG 24 hr tablet; TAKE 2 TABLETS BY MOUTH ONCE DAILY WITH BREAKFAST  Dispense: 180 tablet; Refill: 1 - pregabalin (LYRICA) 75 MG capsule; Take 1 capsule (75 mg total) by mouth 2 (two) times daily.  Dispense: 180 capsule; Refill: 1  2. Long-term use of high-risk medication  - CBC with Differential  3. BPH associated with nocturia  - PSA - tamsulosin (FLOMAX) 0.4 MG CAPS capsule; Take 1 capsule (0.4 mg total) by mouth daily.  Dispense: 90 capsule; Refill: 1 - tadalafil (CIALIS) 5 MG tablet; Take 1 tablet (5 mg total) by mouth daily as needed for erectile dysfunction.  Dispense: 90 tablet; Refill: 1  4. Chronic calcific pancreatitis (HCC)  On statin therapy   5. Chronic recurrent major depressive disorder (HCC)  - amphetamine-dextroamphetamine (ADDERALL) 15 MG tablet; Take 1 tablet by mouth 2 (two) times daily.  Dispense: 60 tablet; Refill: 0 - amphetamine-dextroamphetamine (ADDERALL) 15 MG tablet; Take 1 tablet by mouth 2 (two) times daily.  Dispense: 60 tablet; Refill: 0 - amphetamine-dextroamphetamine (ADDERALL) 15 MG tablet; Take 1 tablet by mouth 2 (two) times daily.  Dispense: 60 tablet; Refill: 0 - buPROPion (WELLBUTRIN XL) 300 MG 24 hr tablet; Take 1 tablet (300 mg total) by mouth daily.  Dispense: 90 tablet; Refill: 1  6. Atherosclerosis of aorta  (HCC)  On statin therapy   7. GAD (generalized anxiety disorder)   8. Gastroesophageal reflux disease without esophagitis  - omeprazole (PRILOSEC) 40 MG capsule; Take 1 capsule (40 mg total) by mouth daily.  Dispense: 90 capsule; Refill: 1  9. Dyslipidemia  - atorvastatin (LIPITOR) 80 MG tablet; Take 1 tablet (80 mg total) by mouth daily.  Dispense: 90 tablet; Refill: 1  10. Gastric reflux  - famotidine (PEPCID) 20 MG tablet; Take 1 tablet (20 mg total) by mouth daily.  Dispense: 90 tablet; Refill: 1  11. 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  Dispense: 90 tablet; Refill: 1  12. Dyslipidemia with low high density lipoprotein (HDL) cholesterol 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)  - omega-3 acid ethyl esters (LOVAZA) 1 g capsule; Take 2 capsules (2 g total) by mouth 2 (two) times daily.  Dispense: 360 capsule; Refill: 1  13. Genital herpes simplex, unspecified site  He still has valtrex at home  14. Bilateral groin pain   Doing well

## 2023-04-11 ENCOUNTER — Ambulatory Visit: Payer: Medicare PPO | Admitting: Family Medicine

## 2023-04-11 ENCOUNTER — Encounter: Payer: Self-pay | Admitting: Family Medicine

## 2023-04-11 VITALS — BP 118/70 | HR 69 | Temp 97.7°F | Resp 16 | Ht 69.0 in | Wt 220.9 lb

## 2023-04-11 DIAGNOSIS — K861 Other chronic pancreatitis: Secondary | ICD-10-CM

## 2023-04-11 DIAGNOSIS — F339 Major depressive disorder, recurrent, unspecified: Secondary | ICD-10-CM

## 2023-04-11 DIAGNOSIS — R1031 Right lower quadrant pain: Secondary | ICD-10-CM

## 2023-04-11 DIAGNOSIS — E1140 Type 2 diabetes mellitus with diabetic neuropathy, unspecified: Secondary | ICD-10-CM

## 2023-04-11 DIAGNOSIS — Z79899 Other long term (current) drug therapy: Secondary | ICD-10-CM

## 2023-04-11 DIAGNOSIS — N401 Enlarged prostate with lower urinary tract symptoms: Secondary | ICD-10-CM

## 2023-04-11 DIAGNOSIS — E785 Hyperlipidemia, unspecified: Secondary | ICD-10-CM

## 2023-04-11 DIAGNOSIS — K219 Gastro-esophageal reflux disease without esophagitis: Secondary | ICD-10-CM

## 2023-04-11 DIAGNOSIS — E1169 Type 2 diabetes mellitus with other specified complication: Secondary | ICD-10-CM

## 2023-04-11 DIAGNOSIS — A6000 Herpesviral infection of urogenital system, unspecified: Secondary | ICD-10-CM | POA: Diagnosis not present

## 2023-04-11 DIAGNOSIS — I700 Atherosclerosis of aorta: Secondary | ICD-10-CM

## 2023-04-11 DIAGNOSIS — R1032 Left lower quadrant pain: Secondary | ICD-10-CM

## 2023-04-11 DIAGNOSIS — E782 Mixed hyperlipidemia: Secondary | ICD-10-CM

## 2023-04-11 DIAGNOSIS — F411 Generalized anxiety disorder: Secondary | ICD-10-CM | POA: Diagnosis not present

## 2023-04-11 DIAGNOSIS — R351 Nocturia: Secondary | ICD-10-CM | POA: Diagnosis not present

## 2023-04-11 LAB — POCT GLYCOSYLATED HEMOGLOBIN (HGB A1C): Hemoglobin A1C: 6.2 % — AB (ref 4.0–5.6)

## 2023-04-11 MED ORDER — AMPHETAMINE-DEXTROAMPHETAMINE 15 MG PO TABS: 15.0000 mg | ORAL_TABLET | Freq: Two times a day (BID) | ORAL | 0 refills | Status: DC

## 2023-04-11 MED ORDER — OMEPRAZOLE 40 MG PO CPDR: 40.0000 mg | DELAYED_RELEASE_CAPSULE | Freq: Every day | ORAL | 1 refills | Status: DC

## 2023-04-11 MED ORDER — PREGABALIN 75 MG PO CAPS: 75.0000 mg | ORAL_CAPSULE | Freq: Two times a day (BID) | ORAL | 1 refills | Status: DC

## 2023-04-11 MED ORDER — OMEGA-3-ACID ETHYL ESTERS 1 G PO CAPS: 2.0000 g | ORAL_CAPSULE | Freq: Two times a day (BID) | ORAL | 1 refills | Status: DC

## 2023-04-11 MED ORDER — TAMSULOSIN HCL 0.4 MG PO CAPS: 0.4000 mg | ORAL_CAPSULE | Freq: Every day | ORAL | 1 refills | Status: DC

## 2023-04-11 MED ORDER — ATORVASTATIN CALCIUM 80 MG PO TABS: 80.0000 mg | ORAL_TABLET | Freq: Every day | ORAL | 1 refills | Status: DC

## 2023-04-11 MED ORDER — BUPROPION HCL ER (XL) 300 MG PO TB24: 300.0000 mg | ORAL_TABLET | Freq: Every day | ORAL | 1 refills | Status: DC

## 2023-04-11 MED ORDER — FAMOTIDINE 20 MG PO TABS: 20.0000 mg | ORAL_TABLET | Freq: Every day | ORAL | 1 refills | Status: DC

## 2023-04-11 MED ORDER — METFORMIN HCL ER 750 MG PO TB24
ORAL_TABLET | ORAL | 1 refills | Status: DC
Start: 2023-04-11 — End: 2023-10-10

## 2023-04-11 MED ORDER — TADALAFIL 5 MG PO TABS
5.0000 mg | ORAL_TABLET | Freq: Every day | ORAL | 1 refills | Status: DC | PRN
Start: 2023-04-11 — End: 2023-10-10

## 2023-04-12 LAB — COMPLETE METABOLIC PANEL WITH GFR
AG Ratio: 2.4 (calc) (ref 1.0–2.5)
ALT: 15 U/L (ref 9–46)
AST: 16 U/L (ref 10–35)
Albumin: 4.6 g/dL (ref 3.6–5.1)
Alkaline phosphatase (APISO): 94 U/L (ref 35–144)
BUN: 13 mg/dL (ref 7–25)
CO2: 28 mmol/L (ref 20–32)
Calcium: 9.7 mg/dL (ref 8.6–10.3)
Chloride: 103 mmol/L (ref 98–110)
Creat: 1.09 mg/dL (ref 0.70–1.28)
Globulin: 1.9 g/dL (calc) (ref 1.9–3.7)
Glucose, Bld: 109 mg/dL — ABNORMAL HIGH (ref 65–99)
Potassium: 5 mmol/L (ref 3.5–5.3)
Sodium: 140 mmol/L (ref 135–146)
Total Bilirubin: 0.4 mg/dL (ref 0.2–1.2)
Total Protein: 6.5 g/dL (ref 6.1–8.1)
eGFR: 72 mL/min/{1.73_m2} (ref >=60)

## 2023-04-12 LAB — CBC WITH DIFFERENTIAL/PLATELET
Absolute Monocytes: 442 cells/uL (ref 200–950)
Basophils Absolute: 52 cells/uL (ref 0–200)
Basophils Relative: 0.8 %
Eosinophils Absolute: 403 cells/uL (ref 15–500)
Eosinophils Relative: 6.2 %
HCT: 39.5 % (ref 38.5–50.0)
Hemoglobin: 13.2 g/dL (ref 13.2–17.1)
Lymphs Abs: 2347 cells/uL (ref 850–3900)
MCH: 28 pg (ref 27.0–33.0)
MCHC: 33.4 g/dL (ref 32.0–36.0)
MCV: 83.9 fL (ref 80.0–100.0)
MPV: 11.6 fL (ref 7.5–12.5)
Monocytes Relative: 6.8 %
Neutro Abs: 3257 cells/uL (ref 1500–7800)
Neutrophils Relative %: 50.1 %
Platelets: 168 10*3/uL (ref 140–400)
RBC: 4.71 10*6/uL (ref 4.20–5.80)
RDW: 14.6 % (ref 11.0–15.0)
Total Lymphocyte: 36.1 %
WBC: 6.5 10*3/uL (ref 3.8–10.8)

## 2023-04-12 LAB — LIPID PANEL
Cholesterol: 118 mg/dL (ref <200)
HDL: 48 mg/dL (ref >=40)
LDL Cholesterol (Calc): 43 mg/dL (calc)
Non-HDL Cholesterol (Calc): 70 mg/dL (calc) (ref <130)
Total CHOL/HDL Ratio: 2.5 (calc) (ref <5.0)
Triglycerides: 205 mg/dL — ABNORMAL HIGH (ref <150)

## 2023-04-12 LAB — MICROALBUMIN / CREATININE URINE RATIO
Creatinine, Urine: 66 mg/dL (ref 20–320)
Microalb, Ur: <0.2 mg/dL

## 2023-04-12 LAB — PSA: PSA: 1.28 ng/mL (ref <=4.00)

## 2023-05-08 DIAGNOSIS — L570 Actinic keratosis: Secondary | ICD-10-CM | POA: Diagnosis not present

## 2023-05-08 DIAGNOSIS — D225 Melanocytic nevi of trunk: Secondary | ICD-10-CM | POA: Diagnosis not present

## 2023-05-08 DIAGNOSIS — D2261 Melanocytic nevi of right upper limb, including shoulder: Secondary | ICD-10-CM | POA: Diagnosis not present

## 2023-05-08 DIAGNOSIS — Z85828 Personal history of other malignant neoplasm of skin: Secondary | ICD-10-CM | POA: Diagnosis not present

## 2023-05-08 DIAGNOSIS — C44612 Basal cell carcinoma of skin of right upper limb, including shoulder: Secondary | ICD-10-CM | POA: Diagnosis not present

## 2023-05-08 DIAGNOSIS — Z85820 Personal history of malignant melanoma of skin: Secondary | ICD-10-CM | POA: Diagnosis not present

## 2023-05-08 DIAGNOSIS — D2262 Melanocytic nevi of left upper limb, including shoulder: Secondary | ICD-10-CM | POA: Diagnosis not present

## 2023-05-08 DIAGNOSIS — D485 Neoplasm of uncertain behavior of skin: Secondary | ICD-10-CM | POA: Diagnosis not present

## 2023-05-08 DIAGNOSIS — D2272 Melanocytic nevi of left lower limb, including hip: Secondary | ICD-10-CM | POA: Diagnosis not present

## 2023-07-06 DIAGNOSIS — C44612 Basal cell carcinoma of skin of right upper limb, including shoulder: Secondary | ICD-10-CM | POA: Diagnosis not present

## 2023-07-12 NOTE — Progress Notes (Deleted)
Name: Shane Boyd.   MRN: [id number]    DOB: [birth year]   Date:07/12/2023       Progress Note  Subjective  Chief Complaint  Follow up  HPI  Dizziness: he still feels lightheaded when he stands up quickly and that has been stable, however he has noticed increase in dizziness sometimes with vertigo when he reaches out / looking up . Discussed vertebral artery problems,  we discussed imaging like CTA and also discussed referral to neurologist on his last visit but no episodes over the last month. We will continue to monitor   GERD: he switched to Omeprazole at night and H2 blocker in am's . He is doing well with current regiment , he states avoids onions and other spicy foods, sometimes takes omeprazole at dinner.   Groin pain:  We discussed possible OA and referral to Ortho but we decided to try  Meloxicam and changed shoe wear  ( OOFOS)  He states knee pain resolved, no longer using knee brace of topical medication since he is doing well We will send a refill for Meloxicam   Chronic calcified pancreatitis: found on CT abdomen done for evaluation of diverticulitis. No symptoms and lipase normal in the past. Unchanged    Dyslipidemia/Atherosclerosis of Aorta : he is taking Lovaza BID and atorvastatin, he denies  myalgia. Last LDL was 46 , he is also taking aspirin 81 mg.Continue medications , we will recheck labs today   DMII with ED: he is taking Metformin 1500 mg daily, he  denies polyphagia, polydipsia or polyuria. He states Lyrica is working very well for neuropathy,he is on Cialis for ED , A1C today is 6.2 % and stable.   Major Depressive Disorder/GAD  he is doing better, currently on Wellbutrin, Adderal, he takes alprazolam 1 mg prn.  He is taking Alprazolam prn last rx for 10 - last fill was January 2024. April is a hard month for him but stable now   Hearing loss: he has been to audiologist, he has tried otc hearing aids but it did not seem to work . Stable    BPH: currently doing  well on cialis an flomax, he states once he started medication his urinary urgency resolved, nocturia only once per night, last PSA was 1.5 and at goal we will recheck labs today  Patient Active Problem List   Diagnosis Date Noted   Atherosclerosis of aorta (HCC) 12/22/2022   Chronic calcific pancreatitis (HCC) 03/23/2022   Iron deficiency anemia due to chronic blood loss    Polyp of ascending colon    Type 2 diabetes mellitus with pressure callus (HCC) 10/17/2018   Right ureteral stone 08/15/2018   Bilateral inguinal hernia without obstruction or gangrene 08/13/2018   Fatty liver 08/13/2018   Difficulty balancing 05/16/2017   Benign neoplasm of ascending colon    Benign neoplasm of transverse colon    Hearing loss, sensorineural, unilateral 10/21/2016   Dyslipidemia with low high density lipoprotein (HDL) cholesterol 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) 09/16/2016   GAD (generalized anxiety disorder) 09/16/2016   Genital herpes 06/08/2016   Dizziness 03/09/2016   History of nonmelanoma skin cancer 09/30/2015   Neck pain 07/29/2015   Anxiety 07/19/2015   CN (constipation) 07/19/2015   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) 07/19/2015   Dyslipidemia 07/19/2015   Gastric reflux 07/19/2015   History of shingles 07/19/2015   History of basal cell cancer 07/19/2015   H/O Malignant melanoma 07/19/2015  Personal history of malignant neoplasm of head and neck 07/19/2015   Chronic recurrent major depressive disorder (HCC) 07/19/2015   Obesity (BMI 30.0-34.9) 07/19/2015   ED (erectile dysfunction) 07/19/2015    Past Surgical History:  Procedure Laterality Date   ARM SKIN LESION BIOPSY / EXCISION Right 05/18/2017   COLONOSCOPY     COLONOSCOPY WITH PROPOFOL N/A 12/15/2016   Procedure: COLONOSCOPY WITH PROPOFOL;  Surgeon: Midge Minium, MD;  Location: [facility];  Service: Endoscopy;  Laterality: N/A;  diabetic - oral meds    COLONOSCOPY WITH PROPOFOL N/A 03/31/2020   Procedure: COLONOSCOPY WITH PROPOFOL;  Surgeon: Midge Minium, MD;  Location: [facility] END[facility];  Service: Endoscopy;  Laterality: N/A;   ESOPHAGOGASTRODUODENOSCOPY (EGD) WITH PROPOFOL N/A 03/31/2020   Procedure: ESOPHAGOGASTRODUODENOSCOPY (EGD) WITH PROPOFOL;  Surgeon: Midge Minium, MD;  Location: [facility] END[facility];  Service: Endoscopy;  Laterality: N/A;   NASAL SEPTUM SURGERY     POLYPECTOMY  12/15/2016   Procedure: POLYPECTOMY;  Surgeon: Midge Minium, MD;  Location: [facility];  Service: Endoscopy;;   SENTINEL NODE BIOPSY Right 05/18/2017   [facility]    Family History  Problem Relation Age of Onset   Diabetes Mother    Heart disease Mother    Hyperlipidemia Mother    CVA Father    Cancer Father        Colon   Depression Father     Social History   Tobacco Use   Smoking status: Never   Smokeless tobacco: Never   Tobacco comments:    Smoking cessation materials not required  Substance Use Topics   Alcohol use: Yes    Alcohol/week: 0.0 standard drinks of alcohol    Comment: Holidays     Current Outpatient Medications:    ALPRAZolam (XANAX) 1 MG tablet, Take 0.5-1 tablets (0.5-1 mg total) by mouth daily as needed for anxiety., Disp: 10 tablet, Rfl: 0   amphetamine-dextroamphetamine (ADDERALL) 15 MG tablet, Take 1 tablet by mouth 2 (two) times daily., Disp: 60 tablet, Rfl: 0   amphetamine-dextroamphetamine (ADDERALL) 15 MG tablet, Take 1 tablet by mouth 2 (two) times daily., Disp: 60 tablet, Rfl: 0   amphetamine-dextroamphetamine (ADDERALL) 15 MG tablet, Take 1 tablet by mouth 2 (two) times daily., Disp: 60 tablet, Rfl: 0   atorvastatin (LIPITOR) 80 MG tablet, Take 1 tablet (80 mg total) by mouth daily., Disp: 90 tablet, Rfl: 1   buPROPion (WELLBUTRIN XL) 300 MG 24 hr tablet, Take 1 tablet (300 mg total) by mouth daily., Disp: 90 tablet, Rfl: 1   cholecalciferol (VITAMIN D3) 25 MCG (1000 UNIT) tablet, Take 1,000 Units by mouth daily., Disp:  , Rfl:    docusate sodium (COLACE) 100 MG capsule, Take 1 tablet once or twice daily as needed for constipation while taking narcotic pain medicine, Disp: 30 capsule, Rfl: 0   famotidine (PEPCID) 20 MG tablet, Take 1 tablet (20 mg total) by mouth daily., Disp: 90 tablet, Rfl: 1   ferrous sulfate 325 (65 FE) MG EC tablet, Take 1 tablet (325 mg total) by mouth 2 (two) times daily., Disp: 180 tablet, Rfl: 0   meloxicam (MOBIC) 15 MG tablet, TAKE 1 TABLET BY MOUTH ONCE DAILY AS NEEDED FOR PAIN. TRY NOT TO TAKE DAILY, Disp: 90 tablet, Rfl: 0   metFORMIN (GLUCOPHAGE-XR) 750 MG 24 hr tablet, TAKE 2 TABLETS BY MOUTH ONCE DAILY WITH BREAKFAST, Disp: 180 tablet, Rfl: 1   omega-3 acid ethyl esters (LOVAZA) 1 g capsule, Take 2 capsules (2 g total) by mouth 2 (  two) times daily., Disp: 360 capsule, Rfl: 1   omeprazole (PRILOSEC) 40 MG capsule, Take 1 capsule (40 mg total) by mouth daily., Disp: 90 capsule, Rfl: 1   pregabalin (LYRICA) 75 MG capsule, Take 1 capsule (75 mg total) by mouth 2 (two) times daily., Disp: 180 capsule, Rfl: 1   tadalafil (CIALIS) 5 MG tablet, Take 1 tablet (5 mg total) by mouth daily as needed for erectile dysfunction., Disp: 90 tablet, Rfl: 1   tamsulosin (FLOMAX) 0.4 MG CAPS capsule, Take 1 capsule (0.4 mg total) by mouth daily., Disp: 90 capsule, Rfl: 1   valACYclovir (VALTREX) 500 MG tablet, TAKE ONE CAPLET BY MOUTH THREE TIMES DAILY, Disp: 90 tablet, Rfl: 1  No Known Allergies  I personally reviewed {[id number]} with the patient/caregiver today.   ROS  ***  Objective  There were no vitals filed for this visit.  There is no height or weight on file to calculate BMI.  Physical Exam ***  No results found for this or any previous visit (from the past 2160 hour(s)).  Diabetic Foot Exam: Diabetic Foot Exam - Simple   No data filed    ***  PHQ2/9:    04/11/2023   10:04 AM 12/22/2022   11:44 AM 09/22/2022   11:45 AM 07/18/2022   11:36 AM 06/22/2022   11:56 AM   Depression screen PHQ 2/9  Decreased Interest 0 0 0 0 0  Down, Depressed, Hopeless 0 0 0 0 0  PHQ - 2 Score 0 0 0 0 0  Altered sleeping 0 0 0 0 0  Tired, decreased energy 0 0 0 0 0  Change in appetite 0 0 0 0 0  Feeling bad or failure about yourself  0 0 0 0 0  Trouble concentrating 0 0 0 0 1  Moving slowly or fidgety/restless 0 0 0 0 0  Suicidal thoughts 0 0 0 0 0  PHQ-9 Score 0 0 0 0 1    phq 9 is {gen pos [id number]} ***  Fall Risk:    04/11/2023    9:56 AM 12/22/2022   11:44 AM 09/22/2022   11:45 AM 07/18/2022   11:36 AM 06/22/2022   11:56 AM  Fall Risk   Falls in the past year? 0 0 0 0 0  Number falls in past yr:  0 0 0 0  Injury with Fall?  0 0 0 0  Risk for fall due to : No Fall Risks No Fall Risks No Fall Risks No Fall Risks No Fall Risks  Follow up Falls prevention discussed Falls prevention discussed Falls prevention discussed Falls prevention discussed Falls prevention discussed   ***   Functional Status Survey:   ***   Assessment & Plan  *** There are no diagnoses linked to this encounter.

## 2023-07-13 ENCOUNTER — Ambulatory Visit: Payer: Medicare PPO | Admitting: Family Medicine

## 2023-07-13 DIAGNOSIS — E1140 Type 2 diabetes mellitus with diabetic neuropathy, unspecified: Secondary | ICD-10-CM

## 2023-07-20 NOTE — Progress Notes (Signed)
Name: Shane Boyd.   MRN: [id number]    DOB: January 03, 1951   Date:07/21/2023       Progress Note  Subjective  Chief Complaint  Follow Up  HPI  Patient presents for annual CPE.  IPSS Questionnaire (AUA-7): Over the past month.   1)  How often have you had a sensation of not emptying your bladder completely after you finish urinating?  0 - Not at all  2)  How often have you had to urinate again less than two hours after you finished urinating? 0 - Not at all  3)  How often have you found you stopped and started again several times when you urinated?  0 - Not at all  4) How difficult have you found it to postpone urination?  0 - Not at all  5) How often have you had a weak urinary stream?  0 - Not at all  6) How often have you had to push or strain to begin urination?  0 - Not at all  7) How many times did you most typically get up to urinate from the time you went to bed until the time you got up in the morning?  1 - 1 time  Total score:  0-7 mildly symptomatic   8-19 moderately symptomatic   20-35 severely symptomatic     Diet: he has been more mindful about his diet Exercise: continue regular physical activity  Last Dental Exam: up to date  Last Eye Exam: up to date   Depression: phq 9 is negative    07/21/2023   11:28 AM 04/11/2023   10:04 AM 12/22/2022   11:44 AM 09/22/2022   11:45 AM 07/18/2022   11:36 AM  Depression screen PHQ 2/9  Decreased Interest 0 0 0 0 0  Down, Depressed, Hopeless 1 0 0 0 0  PHQ - 2 Score 1 0 0 0 0  Altered sleeping 0 0 0 0 0  Tired, decreased energy 0 0 0 0 0  Change in appetite 0 0 0 0 0  Feeling bad or failure about yourself  0 0 0 0 0  Trouble concentrating 0 0 0 0 0  Moving slowly or fidgety/restless 0 0 0 0 0  Suicidal thoughts 0 0 0 0 0  PHQ-9 Score 1 0 0 0 0    Hypertension:  BP Readings from Last 3 Encounters:  07/21/23 124/68  04/11/23 118/70  12/22/22 124/70    Obesity: Wt Readings from Last 3 Encounters:  07/21/23 222  lb (100.7 kg)  04/11/23 220 lb 14.4 oz (100.2 kg)  12/22/22 217 lb (98.4 kg)   BMI Readings from Last 3 Encounters:  07/21/23 32.78 kg/m  04/11/23 32.62 kg/m  12/22/22 32.05 kg/m     Lipids:  Lab Results  Component Value Date   CHOL 118 04/11/2023   CHOL 128 03/23/2022   CHOL 130 03/10/2021   Lab Results  Component Value Date   HDL 48 04/11/2023   HDL 55 03/23/2022   HDL 42 03/10/2021   Lab Results  Component Value Date   LDLCALC 43 04/11/2023   LDLCALC 46 03/23/2022   LDLCALC 58 03/10/2021   Lab Results  Component Value Date   TRIG 205 (H) 04/11/2023   TRIG 200 (H) 03/23/2022   TRIG 238 (H) 03/10/2021   Lab Results  Component Value Date   CHOLHDL 2.5 04/11/2023   CHOLHDL 2.3 03/23/2022   CHOLHDL 3.1 03/10/2021   No results found for: "  LDLDIRECT" Glucose:  Glucose  Date Value Ref Range Status  09/29/2014 191 (H) 65 - 99 mg/dL Final   Glucose, Bld  Date Value Ref Range Status  04/11/2023 109 (H) 65 - 99 mg/dL Final    Comment:    .            Fasting reference interval . For someone without known diabetes, a glucose value between 100 and 125 mg/dL is consistent with prediabetes and should be confirmed with a follow-up test. .   03/23/2022 99 65 - 99 mg/dL Final    Comment:    .            Fasting reference interval .   08/29/2021 151 (H) 70 - 99 mg/dL Final    Comment:    Glucose reference range applies only to samples taken after fasting for at least 8 hours.   Glucose-Capillary  Date Value Ref Range Status  03/31/2020 100 (H) 70 - 99 mg/dL Final    Comment:    Glucose reference range applies only to samples taken after fasting for at least 8 hours.  12/15/2016 95 65 - 99 mg/dL Final  81/19/1478 295 (H) 65 - 99 mg/dL Final    Flowsheet Row Office Visit from 04/11/2023 in [facility]  AUDIT-C Score 1      Married STD testing and prevention (HIV/chl/gon/syphilis): N/A Sexual history: one partner, wife ,  takes Cialis  Hep C Screening: 09/10/12 Skin cancer: up to date with visits to dermatologist , recent basal cell removal  Colorectal cancer: 03/31/20 Prostate cancer:   Lab Results  Component Value Date   PSA 1.28 04/11/2023   PSA 1.25 03/23/2022   PSA 1.0 06/04/2020     Lung cancer:  Low Dose CT Chest recommended if Age 67-80 years, 30 pack-year currently smoking OR have quit w/in 15years. Patient is  not a candidate for screening   AAA: The USPSTF recommends one-time screening with ultrasonography in men ages 60 to 75 years who have ever smoked. Patient is not a candidate for screening  ECG:  02/06/20  Vaccines:   HPV: N/A Tdap: up to date Shingrix: up to date Pneumonia: up to date Flu: up to date COVID-19: up to date  Advanced Care Planning: A voluntary discussion about advance care planning including the explanation and discussion of advance directives.  Discussed health care proxy and Living will, and the patient was able to identify a health care proxy as wife .  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) 10/17/2018   Right ureteral stone 08/15/2018   Bilateral inguinal hernia without obstruction or gangrene 08/13/2018   Fatty liver 08/13/2018   Difficulty balancing 05/16/2017   Benign neoplasm of ascending colon    Benign neoplasm of transverse colon    Hearing loss, sensorineural, unilateral 10/21/2016   Dyslipidemia with low high density lipoprotein (HDL) cholesterol 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) 09/16/2016   GAD (generalized anxiety disorder) 09/16/2016   Genital herpes 06/08/2016   Dizziness 03/09/2016   History of nonmelanoma  skin cancer 09/30/2015   Neck pain 07/29/2015   Anxiety 07/19/2015  CN (constipation) 07/19/2015   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) 07/19/2015   Dyslipidemia 07/19/2015   Gastric reflux 07/19/2015   History of shingles 07/19/2015   History of basal cell cancer 07/19/2015   H/O Malignant melanoma 07/19/2015   Personal history of malignant neoplasm of head and neck 07/19/2015   Chronic recurrent major depressive disorder (HCC) 07/19/2015   Obesity (BMI 30.0-34.9) 07/19/2015   ED (erectile dysfunction) 07/19/2015    Past Surgical History:  Procedure Laterality Date   ARM SKIN LESION BIOPSY / EXCISION Right 05/18/2017   COLONOSCOPY     COLONOSCOPY WITH PROPOFOL N/A 12/15/2016   Procedure: COLONOSCOPY WITH PROPOFOL;  Surgeon: Midge Minium, MD;  Location: [facility];  Service: Endoscopy;  Laterality: N/A;  diabetic - oral meds   COLONOSCOPY WITH PROPOFOL N/A 03/31/2020   Procedure: COLONOSCOPY WITH PROPOFOL;  Surgeon: Midge Minium, MD;  Location: [facility] END[facility];  Service: Endoscopy;  Laterality: N/A;   ESOPHAGOGASTRODUODENOSCOPY (EGD) WITH PROPOFOL N/A 03/31/2020   Procedure: ESOPHAGOGASTRODUODENOSCOPY (EGD) WITH PROPOFOL;  Surgeon: Midge Minium, MD;  Location: [facility] ENDOSCOPY;  Service: Endoscopy;  Laterality: N/A;   NASAL SEPTUM SURGERY     POLYPECTOMY  12/15/2016   Procedure: POLYPECTOMY;  Surgeon: Midge Minium, MD;  Location: [facility];  Service: Endoscopy;;   SENTINEL NODE BIOPSY Right 05/18/2017   [facility]    Family History  Problem Relation Age of Onset   Diabetes Mother    Heart disease Mother    Hyperlipidemia Mother    CVA Father    Cancer Father        Colon   Depression Father     Social History   Socioeconomic History   Marital status: Married    Spouse name: Gavin Pound   Number of children: 1   Years of education: Not on file   Highest education level: Master's degree (e.g., MA, MS, MEng, MEd, MSW, MBA)  Occupational  History   Occupation: retired  Tobacco Use   Smoking status: Never   Smokeless tobacco: Never   Tobacco comments:    Smoking cessation materials not required  Vaping Use   Vaping status: Never Used  Substance and Sexual Activity   Alcohol use: Yes    Alcohol/week: 0.0 standard drinks of alcohol    Comment: Holidays   Drug use: No   Sexual activity: Yes    Partners: Female  Other Topics Concern   Not on file  Social History Narrative   One son: [name] lives in [place]   One son Trip , died from possible suicide while living in [place]   Enjoys music and movies   Social Determinants of Health   Financial Resource Strain: Low Risk  (04/07/2023)   Overall Financial Resource Strain (CARDIA)    Difficulty of Paying Living Expenses: Not hard at all  Food Insecurity: No Food Insecurity (04/07/2023)   Hunger Vital Sign    Worried About Running Out of Food in the Last Year: Never true    Ran Out of Food in the Last Year: Never true  Transportation Needs: No Transportation Needs (04/07/2023)   PRAPARE - Administrator, Civil Service (Medical): No    Lack of Transportation (Non-Medical): No  Physical Activity: Sufficiently Active (04/07/2023)   Exercise Vital Sign    Days of Exercise per Week: 5 days    Minutes of Exercise per Session: 30 min  Stress: No Stress Concern Present (04/07/2023)   [employer] of Occupational Health - Occupational  Stress Questionnaire    Feeling of Stress : Only a little  Social Connections: Moderately Isolated (04/07/2023)   Social Connection and Isolation Panel [NHANES]    Frequency of Communication with Friends and Family: More than three times a week    Frequency of Social Gatherings with Friends and Family: Once a week    Attends Religious Services: Never    Database administrator or Organizations: No    Attends Engineer, structural: Not on file    Marital Status: Married  Catering manager Violence: Not At Risk (07/21/2023)    Humiliation, Afraid, Rape, and Kick questionnaire    Fear of Current or Ex-Partner: No    Emotionally Abused: No    Physically Abused: No    Sexually Abused: No     Current Outpatient Medications:    ALPRAZolam (XANAX) 1 MG tablet, Take 0.5-1 tablets (0.5-1 mg total) by mouth daily as needed for anxiety., Disp: 10 tablet, Rfl: 0   amphetamine-dextroamphetamine (ADDERALL) 15 MG tablet, Take 1 tablet by mouth 2 (two) times daily., Disp: 60 tablet, Rfl: 0   amphetamine-dextroamphetamine (ADDERALL) 15 MG tablet, Take 1 tablet by mouth 2 (two) times daily., Disp: 60 tablet, Rfl: 0   amphetamine-dextroamphetamine (ADDERALL) 15 MG tablet, Take 1 tablet by mouth 2 (two) times daily., Disp: 60 tablet, Rfl: 0   atorvastatin (LIPITOR) 80 MG tablet, Take 1 tablet (80 mg total) by mouth daily., Disp: 90 tablet, Rfl: 1   buPROPion (WELLBUTRIN XL) 300 MG 24 hr tablet, Take 1 tablet (300 mg total) by mouth daily., Disp: 90 tablet, Rfl: 1   cholecalciferol (VITAMIN D3) 25 MCG (1000 UNIT) tablet, Take 1,000 Units by mouth daily., Disp: , Rfl:    docusate sodium (COLACE) 100 MG capsule, Take 1 tablet once or twice daily as needed for constipation while taking narcotic pain medicine, Disp: 30 capsule, Rfl: 0   famotidine (PEPCID) 20 MG tablet, Take 1 tablet (20 mg total) by mouth daily., Disp: 90 tablet, Rfl: 1   ferrous sulfate 325 (65 FE) MG EC tablet, Take 1 tablet (325 mg total) by mouth 2 (two) times daily., Disp: 180 tablet, Rfl: 0   meloxicam (MOBIC) 15 MG tablet, TAKE 1 TABLET BY MOUTH ONCE DAILY AS NEEDED FOR PAIN. TRY NOT TO TAKE DAILY, Disp: 90 tablet, Rfl: 0   metFORMIN (GLUCOPHAGE-XR) 750 MG 24 hr tablet, TAKE 2 TABLETS BY MOUTH ONCE DAILY WITH BREAKFAST, Disp: 180 tablet, Rfl: 1   omega-3 acid ethyl esters (LOVAZA) 1 g capsule, Take 2 capsules (2 g total) by mouth 2 (two) times daily., Disp: 360 capsule, Rfl: 1   omeprazole (PRILOSEC) 40 MG capsule, Take 1 capsule (40 mg total) by mouth daily., Disp:  90 capsule, Rfl: 1   pregabalin (LYRICA) 75 MG capsule, Take 1 capsule (75 mg total) by mouth 2 (two) times daily., Disp: 180 capsule, Rfl: 1   tadalafil (CIALIS) 5 MG tablet, Take 1 tablet (5 mg total) by mouth daily as needed for erectile dysfunction., Disp: 90 tablet, Rfl: 1   tamsulosin (FLOMAX) 0.4 MG CAPS capsule, Take 1 capsule (0.4 mg total) by mouth daily., Disp: 90 capsule, Rfl: 1   valACYclovir (VALTREX) 500 MG tablet, TAKE ONE CAPLET BY MOUTH THREE TIMES DAILY, Disp: 90 tablet, Rfl: 1  No Known Allergies   ROS  Constitutional: Negative for fever or weight change.  Respiratory: Negative for cough and shortness of breath.   Cardiovascular: Negative for chest pain or palpitations.  Gastrointestinal: Negative  for abdominal pain, no bowel changes.  Musculoskeletal: Negative for gait problem or joint swelling.  Skin: Negative for rash.  Neurological: Negative for dizziness or headache.  No other specific complaints in a complete review of systems (except as listed in HPI above).    Objective  Vitals:   07/21/23 1132  BP: 124/68  Pulse: 92  Resp: 16  SpO2: 94%  Weight: 222 lb (100.7 kg)  Height: 5\' 9"  (1.753 m)    Body mass index is 32.78 kg/m.  Physical Exam  Constitutional: Patient appears well-developed and well-nourished. No distress.  HENT: Head: Normocephalic and atraumatic. Ears: not done  Nose: Nose normal. Mouth/Throat: Oropharynx is clear and moist. No oropharyngeal exudate.  Eyes: Conjunctivae and EOM are normal. Pupils are equal, round, and reactive to light. No scleral icterus.  Neck: Normal range of motion. Neck supple. No JVD present. No thyromegaly present.  Cardiovascular: Normal rate, regular rhythm and normal heart sounds.  No murmur heard. No BLE edema. Pulmonary/Chest: Effort normal and breath sounds normal. No respiratory distress. Abdominal: Soft. Bowel sounds are normal, no distension. There is no tenderness. no masses MALE GENITALIA: Normal  descended testes bilaterally, no masses palpated, no hernias, no lesions, no discharge RECTAL: not done  Musculoskeletal: Normal range of motion, no joint effusions. No gross deformities Neurological: he is alert and oriented to person, place, and time. No cranial nerve deficit. Coordination, balance, strength, speech and gait are normal.  Skin: recent biopsy scar healing well, no signs of infection on right forearm Psychiatric: Patient has a normal mood and affect. behavior is normal. Judgment and thought content normal.    Fall Risk:    07/21/2023   11:28 AM 04/11/2023    9:56 AM 12/22/2022   11:44 AM 09/22/2022   11:45 AM 07/18/2022   11:36 AM  Fall Risk   Falls in the past year? 0 0 0 0 0  Number falls in past yr: 0  0 0 0  Injury with Fall? 0  0 0 0  Risk for fall due to : No Fall Risks No Fall Risks No Fall Risks No Fall Risks No Fall Risks  Follow up Falls prevention discussed Falls prevention discussed Falls prevention discussed Falls prevention discussed Falls prevention discussed     Functional Status Survey: Is the patient deaf or have difficulty hearing?: Yes Does the patient have difficulty seeing, even when wearing glasses/contacts?: No Does the patient have difficulty concentrating, remembering, or making decisions?: No Does the patient have difficulty walking or climbing stairs?: No Does the patient have difficulty dressing or bathing?: No Does the patient have difficulty doing errands alone such as visiting a doctor's office or shopping?: No    Assessment & Plan   1. Well adult exam  No labs today   -Prostate cancer screening and PSA options (with potential risks and benefits of testing vs not testing) were discussed along with recent recs/guidelines. -USPSTF grade A and B recommendations reviewed with patient; age-appropriate recommendations, preventive care, screening tests, etc discussed and encouraged; healthy living encouraged; see AVS for patient education  given to patient -Discussed importance of 150 minutes of physical activity weekly, eat two servings of fish weekly, eat one serving of tree nuts ( cashews, pistachios, pecans, almonds.Marland Kitchen) every other day, eat 6 servings of fruit/vegetables daily and drink plenty of water and avoid sweet beverages.  -Reviewed Health Maintenance: yes

## 2023-07-21 ENCOUNTER — Ambulatory Visit (INDEPENDENT_AMBULATORY_CARE_PROVIDER_SITE_OTHER): Payer: Medicare PPO | Admitting: Family Medicine

## 2023-07-21 ENCOUNTER — Encounter: Payer: Self-pay | Admitting: Family Medicine

## 2023-07-21 VITALS — BP 124/68 | HR 92 | Resp 16 | Ht 69.0 in | Wt 222.0 lb

## 2023-07-21 DIAGNOSIS — Z0000 Encounter for general adult medical examination without abnormal findings: Secondary | ICD-10-CM

## 2023-08-03 ENCOUNTER — Encounter: Payer: Self-pay | Admitting: Family Medicine

## 2023-08-04 ENCOUNTER — Encounter: Payer: Self-pay | Admitting: Family Medicine

## 2023-08-15 ENCOUNTER — Ambulatory Visit: Payer: Medicare PPO | Admitting: Family Medicine

## 2023-08-17 ENCOUNTER — Telehealth: Payer: Self-pay | Admitting: Family Medicine

## 2023-08-17 NOTE — Telephone Encounter (Signed)
Copied from CRM [id number]. Topic: Appointment Scheduling - Scheduling Inquiry for Clinic >> Aug 17, 2023 11:37 AM De Blanch wrote: Reason for CRM:Pt is requesting to be worked into Dr. Carlynn Purl schedule for a med refill appointment.  Please advise.

## 2023-08-17 NOTE — Telephone Encounter (Signed)
Please advise where to put him Dr Carlynn Purl is completely booked

## 2023-08-17 NOTE — Telephone Encounter (Signed)
Another physician will most likely not refill what he needs refilled and he only wants to see Dr Carlynn Purl. He's requesting adderrell to be refilled

## 2023-08-18 ENCOUNTER — Other Ambulatory Visit: Payer: Self-pay | Admitting: Family Medicine

## 2023-08-18 DIAGNOSIS — F339 Major depressive disorder, recurrent, unspecified: Secondary | ICD-10-CM

## 2023-08-18 MED ORDER — AMPHETAMINE-DEXTROAMPHETAMINE 15 MG PO TABS
15.0000 mg | ORAL_TABLET | Freq: Two times a day (BID) | ORAL | 0 refills | Status: DC
Start: 2023-08-18 — End: 2023-09-21

## 2023-08-18 NOTE — Telephone Encounter (Signed)
Appt sch'd for Oct 10, 2023

## 2023-09-19 DIAGNOSIS — H43391 Other vitreous opacities, right eye: Secondary | ICD-10-CM | POA: Diagnosis not present

## 2023-09-19 DIAGNOSIS — H2513 Age-related nuclear cataract, bilateral: Secondary | ICD-10-CM | POA: Diagnosis not present

## 2023-09-19 DIAGNOSIS — E119 Type 2 diabetes mellitus without complications: Secondary | ICD-10-CM | POA: Diagnosis not present

## 2023-09-19 LAB — HM DIABETES EYE EXAM

## 2023-09-21 ENCOUNTER — Other Ambulatory Visit: Payer: Self-pay | Admitting: Family Medicine

## 2023-09-21 DIAGNOSIS — F339 Major depressive disorder, recurrent, unspecified: Secondary | ICD-10-CM

## 2023-09-21 MED ORDER — AMPHETAMINE-DEXTROAMPHETAMINE 15 MG PO TABS
15.0000 mg | ORAL_TABLET | Freq: Two times a day (BID) | ORAL | 0 refills | Status: DC
Start: 2023-09-21 — End: 2023-10-10

## 2023-10-09 NOTE — Progress Notes (Unsigned)
Name: Dois Juarbe.   MRN: [id number]    DOB: Sep 24, 1951   Date:10/10/2023       Progress Note  Subjective  Chief Complaint  Follow Up  HPI  Dizziness: Discussed vertebral artery problems,  we discussed imaging like CTA and referral to neurologist in the past but not interested since symptoms resolved.   No episodes in a long time   GERD: he switched to Omeprazole at night and H2 blocker in am's . He is doing well with current regiment . He is doing well with life style modification.  Groin pain:  We discussed possible OA and referral to Ortho but we decided to try  Meloxicam and changed shoe wear  ( [facility])  He states knee pain resolved but still has intermittent right groin pain that improved with Meloxicam that he takes about once a week.   Chronic calcified pancreatitis: found on CT abdomen done for evaluation of diverticulitis. No symptoms and lipase normal in the past.       Dyslipidemia/Atherosclerosis of Aorta : he is taking Lovaza BID and atorvastatin, he denies  myalgia. Last LDL was 43 , he is also taking aspirin 81 mg.   DMII with ED: he is taking Metformin 1500 mg daily, he  denies polyphagia, polydipsia or polyuria. He states Lyrica is working very well for neuropathy,he is on Cialis for ED , A1C today is 6.1 %,he lost weight due to recent COVID and he wants to maintain his weight now.  Major Depressive Disorder/GAD  he is doing better, currently on Wellbutrin, Adderal, he takes alprazolam 1 mg prn.  He is taking Alprazolam prn last rx for 10 - last fill was January 2024, he needs a refill today. His mood is good at this time   Hearing loss: he has been to audiologist, he has tried otc hearing aids but it did not seem to work . Unchanged    BPH: currently doing well on cialis an flomax, he states once he started medication his urinary urgency resolved, nocturia only once per night, last PSA still at goal 1.28   Patient Active Problem List   Diagnosis Date Noted    Atherosclerosis of aorta (HCC) 12/22/2022   Chronic calcific pancreatitis (HCC) 03/23/2022   Iron deficiency anemia due to chronic blood loss    Polyp of ascending colon    Type 2 diabetes mellitus with pressure callus (HCC) 10/17/2018   Right ureteral stone 08/15/2018   Bilateral inguinal hernia without obstruction or gangrene 08/13/2018   Fatty liver 08/13/2018   Difficulty balancing 05/16/2017   Benign neoplasm of ascending colon    Benign neoplasm of transverse colon    Hearing loss, sensorineural, unilateral 10/21/2016   Dyslipidemia with low high density lipoprotein (HDL) cholesterol 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) 09/16/2016   GAD (generalized anxiety disorder) 09/16/2016   Genital herpes 06/08/2016   Dizziness 03/09/2016   History of nonmelanoma skin cancer 09/30/2015   Neck pain 07/29/2015   Anxiety 07/19/2015   Constipation 07/19/2015   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) 07/19/2015   Dyslipidemia 07/19/2015   Gastric reflux 07/19/2015   History of shingles 07/19/2015   History of basal cell cancer 07/19/2015   H/O Malignant melanoma 07/19/2015   Personal history of malignant neoplasm of head and neck 07/19/2015   Chronic recurrent major depressive disorder (HCC) 07/19/2015   Obesity (BMI 30.0-34.9) 07/19/2015   ED (erectile dysfunction) 07/19/2015    Past Surgical History:  Procedure Laterality Date   ARM SKIN LESION BIOPSY / EXCISION Right 05/18/2017   COLONOSCOPY     COLONOSCOPY WITH PROPOFOL N/A 12/15/2016   Procedure: COLONOSCOPY WITH PROPOFOL;  Surgeon: Midge Minium, MD;  Location: [facility];  Service: Endoscopy;  Laterality: N/A;  diabetic - oral meds   COLONOSCOPY WITH PROPOFOL N/A 03/31/2020   Procedure: COLONOSCOPY WITH PROPOFOL;  Surgeon: Midge Minium, MD;  Location: [facility];  Service: Endoscopy;  Laterality: N/A;   ESOPHAGOGASTRODUODENOSCOPY (EGD) WITH PROPOFOL N/A 03/31/2020    Procedure: ESOPHAGOGASTRODUODENOSCOPY (EGD) WITH PROPOFOL;  Surgeon: Midge Minium, MD;  Location: [facility] ENDOSCOPY;  Service: Endoscopy;  Laterality: N/A;   NASAL SEPTUM SURGERY     POLYPECTOMY  12/15/2016   Procedure: POLYPECTOMY;  Surgeon: Midge Minium, MD;  Location: [facility];  Service: Endoscopy;;   SENTINEL NODE BIOPSY Right 05/18/2017   [facility]    Family History  Problem Relation Age of Onset   Diabetes Mother    Heart disease Mother    Hyperlipidemia Mother    CVA Father    Cancer Father        Colon   Depression Father     Social History   Tobacco Use   Smoking status: Never   Smokeless tobacco: Never   Tobacco comments:    Smoking cessation materials not required  Substance Use Topics   Alcohol use: Yes    Alcohol/week: 0.0 standard drinks of alcohol    Comment: Holidays     Current Outpatient Medications:    cholecalciferol (VITAMIN D3) 25 MCG (1000 UNIT) tablet, Take 1,000 Units by mouth daily., Disp: , Rfl:    docusate sodium (COLACE) 100 MG capsule, Take 1 tablet once or twice daily as needed for constipation while taking narcotic pain medicine, Disp: 30 capsule, Rfl: 0   ferrous sulfate 325 (65 FE) MG EC tablet, Take 1 tablet (325 mg total) by mouth 2 (two) times daily., Disp: 180 tablet, Rfl: 0   meloxicam (MOBIC) 15 MG tablet, TAKE 1 TABLET BY MOUTH ONCE DAILY AS NEEDED FOR PAIN. TRY NOT TO TAKE DAILY, Disp: 90 tablet, Rfl: 0   valACYclovir (VALTREX) 500 MG tablet, TAKE ONE CAPLET BY MOUTH THREE TIMES DAILY, Disp: 90 tablet, Rfl: 1   ALPRAZolam (XANAX) 1 MG tablet, Take 0.5-1 tablets (0.5-1 mg total) by mouth daily as needed for anxiety., Disp: 10 tablet, Rfl: 0   amphetamine-dextroamphetamine (ADDERALL) 15 MG tablet, Take 1 tablet by mouth 2 (two) times daily., Disp: 60 tablet, Rfl: 0   amphetamine-dextroamphetamine (ADDERALL) 15 MG tablet, Take 1 tablet by mouth 2 (two) times daily., Disp: 60 tablet, Rfl: 0   amphetamine-dextroamphetamine (ADDERALL) 15  MG tablet, Take 1 tablet by mouth 2 (two) times daily. Fill Nov 16 th 2024, Disp: 60 tablet, Rfl: 0   atorvastatin (LIPITOR) 80 MG tablet, Take 1 tablet (80 mg total) by mouth daily., Disp: 90 tablet, Rfl: 1   buPROPion (WELLBUTRIN XL) 300 MG 24 hr tablet, Take 1 tablet (300 mg total) by mouth daily., Disp: 90 tablet, Rfl: 1   famotidine (PEPCID) 20 MG tablet, Take 1 tablet (20 mg total) by mouth daily., Disp: 90 tablet, Rfl: 1   metFORMIN (GLUCOPHAGE-XR) 750 MG 24 hr tablet, TAKE 2 TABLETS BY MOUTH ONCE DAILY WITH BREAKFAST, Disp: 180 tablet, Rfl: 1   omega-3 acid ethyl esters (LOVAZA) 1 g capsule, Take 2 capsules (2 g total) by mouth 2 (two) times daily., Disp: 360 capsule, Rfl: 1   omeprazole (PRILOSEC) 40  MG capsule, Take 1 capsule (40 mg total) by mouth daily., Disp: 90 capsule, Rfl: 1   pregabalin (LYRICA) 75 MG capsule, Take 1 capsule (75 mg total) by mouth 2 (two) times daily., Disp: 180 capsule, Rfl: 1   tadalafil (CIALIS) 5 MG tablet, Take 1 tablet (5 mg total) by mouth daily as needed for erectile dysfunction., Disp: 90 tablet, Rfl: 1   tamsulosin (FLOMAX) 0.4 MG CAPS capsule, Take 1 capsule (0.4 mg total) by mouth daily., Disp: 90 capsule, Rfl: 1  No Known Allergies  I personally reviewed active problem list, medication list, allergies, family history, social history, health maintenance with the patient/caregiver today.   ROS  Constitutional: Negative for fever , positive for  weight change.  Respiratory: Negative for cough and shortness of breath.   Cardiovascular: Negative for chest pain or palpitations.  Gastrointestinal: Negative for abdominal pain, no bowel changes.  Musculoskeletal: Negative for gait problem or joint swelling.  Skin: Negative for rash.  Neurological: Negative for dizziness or headache.  No other specific complaints in a complete review of systems (except as listed in HPI above).   Objective  Vitals:   10/10/23 1148  BP: 132/70  Pulse: 83  Resp: 16   Temp: 97.9 F (36.6 C)  TempSrc: Oral  SpO2: 96%  Weight: 215 lb 6.4 oz (97.7 kg)  Height: 5\' 9"  (1.753 m)    Body mass index is 31.81 kg/m.  Physical Exam  Constitutional: Patient appears well-developed and well-nourished. Obese  No distress.  HEENT: head atraumatic, normocephalic, pupils equal and reactive to light, neck supple Cardiovascular: Normal rate, regular rhythm and normal heart sounds.  No murmur heard. No BLE edema. Pulmonary/Chest: Effort normal and breath sounds normal. No respiratory distress. Abdominal: Soft.  There is no tenderness. Psychiatric: Patient has a normal mood and affect. behavior is normal. Judgment and thought content normal.   Recent Results (from the past 2160 hour(s))  HM DIABETES EYE EXAM     Status: None   Collection Time: 09/19/23 11:44 AM  Result Value Ref Range   HM Diabetic Eye Exam No Retinopathy No Retinopathy    Comment: ABSTRACTED BY HIM  POCT HgB A1C     Status: Abnormal   Collection Time: 10/10/23 11:49 AM  Result Value Ref Range   Hemoglobin A1C 6.1 (A) 4.0 - 5.6 %   HbA1c POC (<> result, manual entry)     HbA1c, POC (prediabetic range)     HbA1c, POC (controlled diabetic range)      Diabetic Foot Exam: Diabetic Foot Exam - Simple   Simple Foot Form Visual Inspection See comments: Yes Sensation Testing Intact to touch and monofilament testing bilaterally: Yes Pulse Check Posterior Tibialis and Dorsalis pulse intact bilaterally: Yes Comments Callus formation and also lesion on 3rd right toe, he has shown it to dermatologist       PHQ2/9:    10/10/2023   11:47 AM 07/21/2023   11:28 AM 04/11/2023   10:04 AM 12/22/2022   11:44 AM 09/22/2022   11:45 AM  Depression screen PHQ 2/9  Decreased Interest 0 0 0 0 0  Down, Depressed, Hopeless 0 1 0 0 0  PHQ - 2 Score 0 1 0 0 0  Altered sleeping 0 0 0 0 0  Tired, decreased energy 0 0 0 0 0  Change in appetite 0 0 0 0 0  Feeling bad or failure about yourself  0 0 0 0 0   Trouble concentrating 0 0 0 0  0  Moving slowly or fidgety/restless 0 0 0 0 0  Suicidal thoughts 0 0 0 0 0  PHQ-9 Score 0 1 0 0 0    phq 9 is negative   Fall Risk:    10/10/2023   11:38 AM 09/25/2023   12:10 PM 07/21/2023   11:28 AM 04/11/2023    9:56 AM 12/22/2022   11:44 AM  Fall Risk   Falls in the past year? 0 0 0 0 0  Number falls in past yr:  0 0  0  Injury with Fall?  0 0  0  Risk for fall due to : No Fall Risks  No Fall Risks No Fall Risks No Fall Risks  Follow up Falls prevention discussed  Falls prevention discussed Falls prevention discussed Falls prevention discussed     Assessment & Plan  1. Type 2 diabetes mellitus with diabetic neuropathy, without long-term current use of insulin (HCC)  - HM Diabetes Foot Exam - POCT HgB A1C - pregabalin (LYRICA) 75 MG capsule; Take 1 capsule (75 mg total) by mouth 2 (two) times daily.  Dispense: 180 capsule; Refill: 1 - metFORMIN (GLUCOPHAGE-XR) 750 MG 24 hr tablet; TAKE 2 TABLETS BY MOUTH ONCE DAILY WITH BREAKFAST  Dispense: 180 tablet; Refill: 1  2. Need for immunization against influenza  - Flu Vaccine Trivalent High Dose (Fluad)  3. Genital herpes simplex, unspecified site  Continue prn medication  4. BPH associated with nocturia  - tamsulosin (FLOMAX) 0.4 MG CAPS capsule; Take 1 capsule (0.4 mg total) by mouth daily.  Dispense: 90 capsule; Refill: 1 - tadalafil (CIALIS) 5 MG tablet; Take 1 tablet (5 mg total) by mouth daily as needed for erectile dysfunction.  Dispense: 90 tablet; Refill: 1  5. Gastroesophageal reflux disease without esophagitis  - omeprazole (PRILOSEC) 40 MG capsule; Take 1 capsule (40 mg total) by mouth daily.  Dispense: 90 capsule; Refill: 1 - famotidine (PEPCID) 20 MG tablet; Take 1 tablet (20 mg total) by mouth daily.  Dispense: 90 tablet; Refill: 1  6. Dyslipidemia with low high density lipoprotein (HDL) cholesterol 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)  - omega-3  acid ethyl esters (LOVAZA) 1 g capsule; Take 2 capsules (2 g total) by mouth 2 (two) times daily.  Dispense: 360 capsule; Refill: 1  7. Chronic recurrent major depressive disorder (HCC)  - buPROPion (WELLBUTRIN XL) 300 MG 24 hr tablet; Take 1 tablet (300 mg total) by mouth daily.  Dispense: 90 tablet; Refill: 1 - amphetamine-dextroamphetamine (ADDERALL) 15 MG tablet; Take 1 tablet by mouth 2 (two) times daily.  Dispense: 60 tablet; Refill: 0 - amphetamine-dextroamphetamine (ADDERALL) 15 MG tablet; Take 1 tablet by mouth 2 (two) times daily.  Dispense: 60 tablet; Refill: 0 - amphetamine-dextroamphetamine (ADDERALL) 15 MG tablet; Take 1 tablet by mouth 2 (two) times daily. Fill Nov 16 th 2024  Dispense: 60 tablet; Refill: 0  8. Dyslipidemia  - atorvastatin (LIPITOR) 80 MG tablet; Take 1 tablet (80 mg total) by mouth daily.  Dispense: 90 tablet; Refill: 1  9. GAD (generalized anxiety disorder)  - ALPRAZolam (XANAX) 1 MG tablet; Take 0.5-1 tablets (0.5-1 mg total) by mouth daily as needed for anxiety.  Dispense: 10 tablet; Refill: 0

## 2023-10-10 ENCOUNTER — Telehealth: Payer: Self-pay | Admitting: Family Medicine

## 2023-10-10 ENCOUNTER — Ambulatory Visit: Payer: Medicare PPO | Admitting: Family Medicine

## 2023-10-10 ENCOUNTER — Encounter: Payer: Self-pay | Admitting: Family Medicine

## 2023-10-10 VITALS — BP 132/70 | HR 83 | Temp 97.9°F | Resp 16 | Ht 69.0 in | Wt 215.4 lb

## 2023-10-10 DIAGNOSIS — E785 Hyperlipidemia, unspecified: Secondary | ICD-10-CM | POA: Diagnosis not present

## 2023-10-10 DIAGNOSIS — E1169 Type 2 diabetes mellitus with other specified complication: Secondary | ICD-10-CM

## 2023-10-10 DIAGNOSIS — Z23 Encounter for immunization: Secondary | ICD-10-CM

## 2023-10-10 DIAGNOSIS — N401 Enlarged prostate with lower urinary tract symptoms: Secondary | ICD-10-CM | POA: Diagnosis not present

## 2023-10-10 DIAGNOSIS — K219 Gastro-esophageal reflux disease without esophagitis: Secondary | ICD-10-CM | POA: Diagnosis not present

## 2023-10-10 DIAGNOSIS — A6000 Herpesviral infection of urogenital system, unspecified: Secondary | ICD-10-CM | POA: Diagnosis not present

## 2023-10-10 DIAGNOSIS — F339 Major depressive disorder, recurrent, unspecified: Secondary | ICD-10-CM | POA: Diagnosis not present

## 2023-10-10 DIAGNOSIS — E782 Mixed hyperlipidemia: Secondary | ICD-10-CM

## 2023-10-10 DIAGNOSIS — F411 Generalized anxiety disorder: Secondary | ICD-10-CM

## 2023-10-10 DIAGNOSIS — E1140 Type 2 diabetes mellitus with diabetic neuropathy, unspecified: Secondary | ICD-10-CM

## 2023-10-10 DIAGNOSIS — R351 Nocturia: Secondary | ICD-10-CM

## 2023-10-10 LAB — POCT GLYCOSYLATED HEMOGLOBIN (HGB A1C): Hemoglobin A1C: 6.1 % — AB (ref 4.0–5.6)

## 2023-10-10 MED ORDER — TADALAFIL 5 MG PO TABS
5.0000 mg | ORAL_TABLET | Freq: Every day | ORAL | 1 refills | Status: DC | PRN
Start: 2023-10-10 — End: 2024-05-23

## 2023-10-10 MED ORDER — BUPROPION HCL ER (XL) 300 MG PO TB24: 300.0000 mg | ORAL_TABLET | Freq: Every day | ORAL | 1 refills | Status: DC

## 2023-10-10 MED ORDER — ATORVASTATIN CALCIUM 80 MG PO TABS: 80.0000 mg | ORAL_TABLET | Freq: Every day | ORAL | 1 refills | Status: DC

## 2023-10-10 MED ORDER — AMPHETAMINE-DEXTROAMPHETAMINE 15 MG PO TABS: 15.0000 mg | ORAL_TABLET | Freq: Two times a day (BID) | ORAL | 0 refills | Status: DC

## 2023-10-10 MED ORDER — METFORMIN HCL ER 750 MG PO TB24: ORAL_TABLET | ORAL | 1 refills | Status: DC

## 2023-10-10 MED ORDER — OMEGA-3-ACID ETHYL ESTERS 1 G PO CAPS: 2.0000 g | ORAL_CAPSULE | Freq: Two times a day (BID) | ORAL | 1 refills | Status: DC

## 2023-10-10 MED ORDER — FAMOTIDINE 20 MG PO TABS: 20.0000 mg | ORAL_TABLET | Freq: Every day | ORAL | 1 refills | Status: DC

## 2023-10-10 MED ORDER — TAMSULOSIN HCL 0.4 MG PO CAPS
0.4000 mg | ORAL_CAPSULE | Freq: Every day | ORAL | 1 refills | Status: DC
Start: 2023-10-10 — End: 2024-05-23

## 2023-10-10 MED ORDER — PREGABALIN 75 MG PO CAPS
75.0000 mg | ORAL_CAPSULE | Freq: Two times a day (BID) | ORAL | 1 refills | Status: DC
Start: 2023-10-10 — End: 2024-04-01

## 2023-10-10 MED ORDER — ALPRAZOLAM 1 MG PO TABS: 0.5000 mg | ORAL_TABLET | Freq: Every day | ORAL | 0 refills | Status: DC | PRN

## 2023-10-10 MED ORDER — OMEPRAZOLE 40 MG PO CPDR
40.0000 mg | DELAYED_RELEASE_CAPSULE | Freq: Every day | ORAL | 1 refills | Status: DC
Start: 2023-10-10 — End: 2024-05-23

## 2023-10-10 NOTE — Telephone Encounter (Signed)
Dr Carlynn Purl ask that this pt be scheduled for a AWV. Thank you

## 2023-11-09 ENCOUNTER — Ambulatory Visit: Payer: Medicare PPO

## 2023-11-09 DIAGNOSIS — Z0000 Encounter for general adult medical examination without abnormal findings: Secondary | ICD-10-CM | POA: Diagnosis not present

## 2023-11-09 NOTE — Progress Notes (Signed)
Subjective:   Shane Boyd. is a 72 y.o. male who presents for Medicare Annual/Subsequent preventive examination.  Visit Complete: Virtual I connected with  Estanislado Spire. on 11/09/23 by a audio enabled telemedicine application and verified that I am speaking with the correct person using two identifiers.  Patient Location: Home  Provider Location: Office/Clinic  I discussed the limitations of evaluation and management by telemedicine. The patient expressed understanding and agreed to proceed.  Vital Signs: Because this visit was a virtual/telehealth visit, some criteria may be missing or patient reported. Any vitals not documented were not able to be obtained and vitals that have been documented are patient reported.  Cardiac Risk Factors include: advanced age (>34men, >30 women);diabetes mellitus;dyslipidemia;male gender;obesity (BMI >30kg/m2)     Objective:    Today's Vitals   11/09/23 1306  PainSc: 0-No pain   There is no height or weight on file to calculate BMI.     11/09/2023    1:12 PM 08/29/2021    9:30 PM 06/03/2021    8:57 AM 06/02/2020    8:35 AM 03/31/2020    7:40 AM 08/12/2018    6:06 PM 07/04/2017    3:52 PM  Advanced Directives  Does Patient Have a Medical Advance Directive? No Yes Yes Yes Yes Yes Yes  Type of Advance Directive  Living will Healthcare Power of [place];Living will Healthcare Power of [place];Living will Living will Living will Healthcare Power of [place];Living will  Does patient want to make changes to medical advance directive?      No - Patient declined   Copy of Healthcare Power of Attorney in Chart?   Yes - validated most recent copy scanned in chart (See row information) Yes - validated most recent copy scanned in chart (See row information)   Yes  Would patient like information on creating a medical advance directive? No - Patient declined          Current Medications (verified) Outpatient Encounter Medications as of  11/09/2023  Medication Sig   ALPRAZolam (XANAX) 1 MG tablet Take 0.5-1 tablets (0.5-1 mg total) by mouth daily as needed for anxiety.   amphetamine-dextroamphetamine (ADDERALL) 15 MG tablet Take 1 tablet by mouth 2 (two) times daily.   amphetamine-dextroamphetamine (ADDERALL) 15 MG tablet Take 1 tablet by mouth 2 (two) times daily.   amphetamine-dextroamphetamine (ADDERALL) 15 MG tablet Take 1 tablet by mouth 2 (two) times daily. Fill Nov 16 th 2024   atorvastatin (LIPITOR) 80 MG tablet Take 1 tablet (80 mg total) by mouth daily.   buPROPion (WELLBUTRIN XL) 300 MG 24 hr tablet Take 1 tablet (300 mg total) by mouth daily.   cholecalciferol (VITAMIN D3) 25 MCG (1000 UNIT) tablet Take 1,000 Units by mouth daily.   docusate sodium (COLACE) 100 MG capsule Take 1 tablet once or twice daily as needed for constipation while taking narcotic pain medicine   famotidine (PEPCID) 20 MG tablet Take 1 tablet (20 mg total) by mouth daily.   ferrous sulfate 325 (65 FE) MG EC tablet Take 1 tablet (325 mg total) by mouth 2 (two) times daily.   meloxicam (MOBIC) 15 MG tablet TAKE 1 TABLET BY MOUTH ONCE DAILY AS NEEDED FOR PAIN. TRY NOT TO TAKE DAILY   metFORMIN (GLUCOPHAGE-XR) 750 MG 24 hr tablet TAKE 2 TABLETS BY MOUTH ONCE DAILY WITH BREAKFAST   omega-3 acid ethyl esters (LOVAZA) 1 g capsule Take 2 capsules (2 g total) by mouth 2 (two) times daily.  omeprazole (PRILOSEC) 40 MG capsule Take 1 capsule (40 mg total) by mouth daily.   pregabalin (LYRICA) 75 MG capsule Take 1 capsule (75 mg total) by mouth 2 (two) times daily.   tadalafil (CIALIS) 5 MG tablet Take 1 tablet (5 mg total) by mouth daily as needed for erectile dysfunction.   tamsulosin (FLOMAX) 0.4 MG CAPS capsule Take 1 capsule (0.4 mg total) by mouth daily.   valACYclovir (VALTREX) 500 MG tablet TAKE ONE CAPLET BY MOUTH THREE TIMES DAILY   No facility-administered encounter medications on file as of 11/09/2023.    Allergies (verified) Patient has no  known allergies.   History: Past Medical History:  Diagnosis Date   Anemia    Anxiety    Chronic insomnia    Constipation    Depression    Diabetes mellitus without complication (HCC)    Encounter for long-term (current) use of other high-risk medications    GERD (gastroesophageal reflux disease)    History of malignant melanoma    Dr. Orson Aloe   History of squamous cell carcinoma    Hx of basal cell carcinoma    Hyperlipidemia    Leukocytosis    Obesity    Other male erectile dysfunction    Vertigo    1-2x/yr   Past Surgical History:  Procedure Laterality Date   ARM SKIN LESION BIOPSY / EXCISION Right 05/18/2017   COLONOSCOPY     COLONOSCOPY WITH PROPOFOL N/A 12/15/2016   Procedure: COLONOSCOPY WITH PROPOFOL;  Surgeon: Midge Minium, MD;  Location: [facility];  Service: Endoscopy;  Laterality: N/A;  diabetic - oral meds   COLONOSCOPY WITH PROPOFOL N/A 03/31/2020   Procedure: COLONOSCOPY WITH PROPOFOL;  Surgeon: Midge Minium, MD;  Location: [facility];  Service: Endoscopy;  Laterality: N/A;   ESOPHAGOGASTRODUODENOSCOPY (EGD) WITH PROPOFOL N/A 03/31/2020   Procedure: ESOPHAGOGASTRODUODENOSCOPY (EGD) WITH PROPOFOL;  Surgeon: Midge Minium, MD;  Location: [facility];  Service: Endoscopy;  Laterality: N/A;   NASAL SEPTUM SURGERY     POLYPECTOMY  12/15/2016   Procedure: POLYPECTOMY;  Surgeon: Midge Minium, MD;  Location: [facility];  Service: Endoscopy;;   SENTINEL NODE BIOPSY Right 05/18/2017   [facility]   Family History  Problem Relation Age of Onset   Diabetes Mother    Heart disease Mother    Hyperlipidemia Mother    CVA Father    Cancer Father        Colon   Depression Father    Social History   Socioeconomic History   Marital status: Married    Spouse name: Gavin Pound   Number of children: 1   Years of education: Not on file   Highest education level: Master's degree (e.g., MA, MS, MEng, MEd, MSW, MBA)  Occupational History   Occupation: retired   Tobacco Use   Smoking status: Never   Smokeless tobacco: Never   Tobacco comments:    Smoking cessation materials not required  Vaping Use   Vaping status: Never Used  Substance and Sexual Activity   Alcohol use: Yes    Alcohol/week: 0.0 standard drinks of alcohol    Comment: Holidays   Drug use: No   Sexual activity: Yes    Partners: Female  Other Topics Concern   Not on file  Social History Narrative   One son: [name] lives in [place]   One son Trip , died from possible suicide while living in [place]   Enjoys music and movies   Social Determinants of Corporate investment banker  Strain: Low Risk  (11/09/2023)   Overall Financial Resource Strain (CARDIA)    Difficulty of Paying Living Expenses: Not hard at all  Food Insecurity: No Food Insecurity (11/09/2023)   Hunger Vital Sign    Worried About Running Out of Food in the Last Year: Never true    Ran Out of Food in the Last Year: Never true  Transportation Needs: No Transportation Needs (11/09/2023)   PRAPARE - Administrator, Civil Service (Medical): No    Lack of Transportation (Non-Medical): No  Physical Activity: Insufficiently Active (11/09/2023)   Exercise Vital Sign    Days of Exercise per Week: 4 days    Minutes of Exercise per Session: 30 min  Stress: No Stress Concern Present (11/09/2023)   [employer] of Occupational Health - Occupational Stress Questionnaire    Feeling of Stress : Not at all  Social Connections: Moderately Isolated (11/09/2023)   Social Connection and Isolation Panel [NHANES]    Frequency of Communication with Friends and Family: More than three times a week    Frequency of Social Gatherings with Friends and Family: Never    Attends Religious Services: Never    Database administrator or Organizations: No    Attends Engineer, structural: Never    Marital Status: Married    Tobacco Counseling Counseling given: Not Answered Tobacco comments: Smoking cessation  materials not required   Clinical Intake:  Pre-visit preparation completed: Yes  Pain : No/denies pain Pain Score: 0-No pain     BMI - recorded: 31.7 Nutritional Status: BMI > 30  Obese Nutritional Risks: None Diabetes: Yes CBG done?: No Did pt. bring in CBG monitor from home?: No  How often do you need to have someone help you when you read instructions, pamphlets, or other written materials from your doctor or pharmacy?: 1 - Never  Interpreter Needed?: No  Information entered by :: Kennedy Bucker, LPN   Activities of Daily Living    11/09/2023    1:14 PM 10/10/2023   11:47 AM  In your present state of health, do you have any difficulty performing the following activities:  Hearing? 1 0  Vision? 0 0  Difficulty concentrating or making decisions? 0 0  Walking or climbing stairs? 0 0  Dressing or bathing? 0 0  Doing errands, shopping? 0 0  Preparing Food and eating ? N   Using the Toilet? N   In the past six months, have you accidently leaked urine? N   Do you have problems with loss of bowel control? N   Managing your Medications? N   Managing your Finances? N   Housekeeping or managing your Housekeeping? N     Patient Care Team: Alba Cory, MD as PCP - General (Family Medicine) Debbe Odea, MD as PCP - Cardiology (Cardiology) Lonell Face, MD as Consulting Physician (Neurology) Harrel Carina, MD as Consulting Physician (Surgery) Dasher, Cliffton Asters, MD as Consulting Physician (Dermatology) [facility]  Indicate any recent Medical Services you may have received from other than [facility] providers in the past year (date may be approximate).     Assessment:   This is a routine wellness examination for Agency.  Hearing/Vision screen Hearing Screening - Comments:: Wears aids, both ears  Vision Screening - Comments:: Wears glasses- Dr.Woodard   Goals Addressed             This Visit's Progress    DIET - EAT MORE FRUITS AND  VEGETABLES         Depression Screen    11/09/2023    1:10 PM 10/10/2023   11:47 AM 07/21/2023   11:28 AM 04/11/2023   10:04 AM 12/22/2022   11:44 AM 09/22/2022   11:45 AM 07/18/2022   11:36 AM  PHQ 2/9 Scores  PHQ - 2 Score 0 0 1 0 0 0 0  PHQ- 9 Score 0 0 1 0 0 0 0    Fall Risk    11/09/2023    1:14 PM 10/10/2023   11:38 AM 09/25/2023   12:10 PM 07/21/2023   11:28 AM 04/11/2023    9:56 AM  Fall Risk   Falls in the past year? 0 0 0 0 0  Number falls in past yr: 0  0 0   Injury with Fall? 0  0 0   Risk for fall due to : No Fall Risks No Fall Risks  No Fall Risks No Fall Risks  Follow up Falls prevention discussed;Falls evaluation completed Falls prevention discussed  Falls prevention discussed Falls prevention discussed    MEDICARE RISK AT HOME: Medicare Risk at Home Any stairs in or around the home?: Yes If so, are there any without handrails?: No Home free of loose throw rugs in walkways, pet beds, electrical cords, etc?: Yes Adequate lighting in your home to reduce risk of falls?: Yes Life alert?: No Use of a cane, walker or w/c?: No Grab bars in the bathroom?: Yes Shower chair or bench in shower?: Yes Elevated toilet seat or a handicapped toilet?: No  TIMED UP AND GO:  Was the test performed?  No    Cognitive Function:        11/09/2023    1:16 PM 06/13/2022   10:03 AM  6CIT Screen  What Year? 0 points 0 points  What month? 0 points 0 points  What time? 0 points 0 points  Count back from 20 0 points 0 points  Months in reverse 0 points 0 points  Repeat phrase 0 points 0 points  Total Score 0 points 0 points    Immunizations Immunization History  Administered Date(s) Administered   Fluad Quad(high Dose 65+) 09/05/2019, 09/09/2020, 09/17/2021, 09/22/2022   Fluad Trivalent(High Dose 65+) 10/10/2023   Influenza Split 10/21/2008, 08/14/2012   Influenza, High Dose Seasonal PF 07/29/2015, 09/16/2016, 08/13/2018   Influenza, Seasonal, Injecte, Preservative Fre  09/20/2011   Influenza,inj,Quad PF,6+ Mos 09/18/2013   Influenza-Unspecified 09/18/2013   PFIZER Comirnaty(Gray Top)Covid-19 Tri-Sucrose Vaccine 03/26/2021, 11/08/2022   PFIZER(Purple Top)SARS-COV-2 Vaccination 01/31/2020, 02/25/2020, 09/02/2020, 08/20/2021   PNEUMOCOCCAL CONJUGATE-20 07/18/2022   Pneumococcal Conjugate-13 03/17/2010, 02/09/2016   Pneumococcal Polysaccharide-23 03/17/2010, 07/04/2017   Rsv, Bivalent, Protein Subunit Rsvpref,pf Verdis Frederickson) 10/12/2022   Td 10/18/2006   Tdap 10/18/2006, 10/04/2017, 10/12/2022   Zoster Recombinant(Shingrix) 10/12/2022, 01/12/2023   Zoster, Live 08/14/2012    TDAP status: Up to date  Flu Vaccine status: Up to date  Pneumococcal vaccine status: Up to date  Covid-19 vaccine status: Completed vaccines  Qualifies for Shingles Vaccine? Yes   Zostavax completed Yes   Shingrix Completed?: Yes  Screening Tests Health Maintenance  Topic Date Due   COVID-19 Vaccine (7 - 2023-24 season) 08/06/2023   HEMOGLOBIN A1C  04/08/2024   Diabetic kidney evaluation - eGFR measurement  04/10/2024   Diabetic kidney evaluation - Urine ACR  04/10/2024   OPHTHALMOLOGY EXAM  09/18/2024   FOOT EXAM  10/09/2024   Medicare Annual Wellness (AWV)  11/08/2024   Colonoscopy  03/31/2025  DTaP/Tdap/Td (5 - Td or Tdap) 10/12/2032   Pneumonia Vaccine 66+ Years old  Completed   INFLUENZA VACCINE  Completed   Hepatitis C Screening  Completed   Zoster Vaccines- Shingrix  Completed   HPV VACCINES  Aged Out    Health Maintenance  Health Maintenance Due  Topic Date Due   COVID-19 Vaccine (7 - 2023-24 season) 08/06/2023    Colorectal cancer screening: Type of screening: Colonoscopy. Completed 03/31/20. Repeat every 5 years  Lung Cancer Screening: (Low Dose CT Chest recommended if Age 35-80 years, 20 pack-year currently smoking OR have quit w/in 15years.) does not qualify.    Additional Screening:  Hepatitis C Screening: does qualify; Completed  09/10/12  Vision Screening: Recommended annual ophthalmology exams for early detection of glaucoma and other disorders of the eye. Is the patient up to date with their annual eye exam?  Yes  Who is the provider or what is the name of the office in which the patient attends annual eye exams? Dr.Woodard If pt is not established with a provider, would they like to be referred to a provider to establish care? No .   Dental Screening: Recommended annual dental exams for proper oral hygiene  Diabetic Foot Exam: Diabetic Foot Exam: Completed 10/10/23  Community Resource Referral / Chronic Care Management: CRR required this visit?  No   CCM required this visit?  No     Plan:     I have personally reviewed and noted the following in the patient's chart:   Medical and social history Use of alcohol, tobacco or illicit drugs  Current medications and supplements including opioid prescriptions. Patient is not currently taking opioid prescriptions. Functional ability and status Nutritional status Physical activity Advanced directives List of other physicians Hospitalizations, surgeries, and ER visits in previous 12 months Vitals Screenings to include cognitive, depression, and falls Referrals and appointments  In addition, I have reviewed and discussed with patient certain preventive protocols, quality metrics, and best practice recommendations. A written personalized care plan for preventive services as well as general preventive health recommendations were provided to patient.     Hal Hope, LPN   10/13/1477   After Visit Summary: (MyChart) Due to this being a telephonic visit, the after visit summary with patients personalized plan was offered to patient via MyChart   Nurse Notes: none

## 2023-11-09 NOTE — Patient Instructions (Addendum)
Mr. Shane Boyd , Thank you for taking time to come for your Medicare Wellness Visit. I appreciate your ongoing commitment to your health goals. Please review the following plan we discussed and let me know if I can assist you in the future.   Referrals/Orders/Follow-Ups/Clinician Recommendations: none  This is a list of the screening recommended for you and due dates:  Health Maintenance  Topic Date Due   COVID-19 Vaccine (7 - 2023-24 season) 08/06/2023   Hemoglobin A1C  04/08/2024   Yearly kidney function blood test for diabetes  04/10/2024   Yearly kidney health urinalysis for diabetes  04/10/2024   Eye exam for diabetics  09/18/2024   Complete foot exam   10/09/2024   Medicare Annual Wellness Visit  11/08/2024   Colon Cancer Screening  03/31/2025   DTaP/Tdap/Td vaccine (5 - Td or Tdap) 10/12/2032   Pneumonia Vaccine  Completed   Flu Shot  Completed   Hepatitis C Screening  Completed   Zoster (Shingles) Vaccine  Completed   HPV Vaccine  Aged Out    Advanced directives: (ACP Link)Information on Advanced Care Planning can be found at [facility] of [place] Advance Health Care Directives Advance Health Care Directives ([email or web address]   Next Medicare Annual Wellness Visit scheduled for next year: Yes   11/21/24 @ 1:10 pm by video

## 2023-12-26 DIAGNOSIS — C44519 Basal cell carcinoma of skin of other part of trunk: Secondary | ICD-10-CM | POA: Diagnosis not present

## 2023-12-26 DIAGNOSIS — D225 Melanocytic nevi of trunk: Secondary | ICD-10-CM | POA: Diagnosis not present

## 2023-12-26 DIAGNOSIS — Z85820 Personal history of malignant melanoma of skin: Secondary | ICD-10-CM | POA: Diagnosis not present

## 2023-12-26 DIAGNOSIS — D2261 Melanocytic nevi of right upper limb, including shoulder: Secondary | ICD-10-CM | POA: Diagnosis not present

## 2023-12-26 DIAGNOSIS — D2262 Melanocytic nevi of left upper limb, including shoulder: Secondary | ICD-10-CM | POA: Diagnosis not present

## 2023-12-26 DIAGNOSIS — D2272 Melanocytic nevi of left lower limb, including hip: Secondary | ICD-10-CM | POA: Diagnosis not present

## 2023-12-26 DIAGNOSIS — D2271 Melanocytic nevi of right lower limb, including hip: Secondary | ICD-10-CM | POA: Diagnosis not present

## 2023-12-26 DIAGNOSIS — Z85828 Personal history of other malignant neoplasm of skin: Secondary | ICD-10-CM | POA: Diagnosis not present

## 2023-12-26 DIAGNOSIS — L821 Other seborrheic keratosis: Secondary | ICD-10-CM | POA: Diagnosis not present

## 2023-12-26 DIAGNOSIS — D0439 Carcinoma in situ of skin of other parts of face: Secondary | ICD-10-CM | POA: Diagnosis not present

## 2023-12-26 DIAGNOSIS — Z08 Encounter for follow-up examination after completed treatment for malignant neoplasm: Secondary | ICD-10-CM | POA: Diagnosis not present

## 2024-01-28 ENCOUNTER — Other Ambulatory Visit: Payer: Self-pay | Admitting: Family Medicine

## 2024-01-28 DIAGNOSIS — F339 Major depressive disorder, recurrent, unspecified: Secondary | ICD-10-CM

## 2024-01-29 MED ORDER — AMPHETAMINE-DEXTROAMPHETAMINE 15 MG PO TABS: 15.0000 mg | ORAL_TABLET | Freq: Two times a day (BID) | ORAL | 0 refills | Status: DC

## 2024-02-08 ENCOUNTER — Ambulatory Visit: Payer: Medicare PPO | Admitting: Family Medicine

## 2024-02-08 ENCOUNTER — Encounter: Payer: Self-pay | Admitting: Family Medicine

## 2024-02-08 VITALS — BP 130/74 | HR 77 | Resp 16 | Ht 69.0 in | Wt 219.4 lb

## 2024-02-08 DIAGNOSIS — R351 Nocturia: Secondary | ICD-10-CM

## 2024-02-08 DIAGNOSIS — R1031 Right lower quadrant pain: Secondary | ICD-10-CM

## 2024-02-08 DIAGNOSIS — K219 Gastro-esophageal reflux disease without esophagitis: Secondary | ICD-10-CM

## 2024-02-08 DIAGNOSIS — E1140 Type 2 diabetes mellitus with diabetic neuropathy, unspecified: Secondary | ICD-10-CM

## 2024-02-08 DIAGNOSIS — F339 Major depressive disorder, recurrent, unspecified: Secondary | ICD-10-CM | POA: Diagnosis not present

## 2024-02-08 DIAGNOSIS — F411 Generalized anxiety disorder: Secondary | ICD-10-CM

## 2024-02-08 DIAGNOSIS — K861 Other chronic pancreatitis: Secondary | ICD-10-CM

## 2024-02-08 DIAGNOSIS — I700 Atherosclerosis of aorta: Secondary | ICD-10-CM

## 2024-02-08 DIAGNOSIS — E1169 Type 2 diabetes mellitus with other specified complication: Secondary | ICD-10-CM

## 2024-02-08 DIAGNOSIS — R1032 Left lower quadrant pain: Secondary | ICD-10-CM

## 2024-02-08 DIAGNOSIS — N401 Enlarged prostate with lower urinary tract symptoms: Secondary | ICD-10-CM

## 2024-02-08 DIAGNOSIS — E782 Mixed hyperlipidemia: Secondary | ICD-10-CM

## 2024-02-08 LAB — POCT GLYCOSYLATED HEMOGLOBIN (HGB A1C): Hemoglobin A1C: 6.6 % — AB (ref 4.0–5.6)

## 2024-02-08 MED ORDER — AMPHETAMINE-DEXTROAMPHETAMINE 15 MG PO TABS: 15.0000 mg | ORAL_TABLET | Freq: Two times a day (BID) | ORAL | 0 refills | Status: DC

## 2024-02-08 MED ORDER — MELOXICAM 15 MG PO TABS: 15.0000 mg | ORAL_TABLET | Freq: Every day | ORAL | 0 refills | Status: DC

## 2024-02-08 NOTE — Progress Notes (Signed)
 Name: Shane Boyd.   MRN: [id number]    DOB: 1950/12/20   Date:02/08/2024       Progress Note  Subjective  Chief Complaint  Chief Complaint  Patient presents with   Medical Management of Chronic Issues   HPI   GERD: he switched to Omeprazole at night and H2 blocker in am's . Symptoms are sporadic now.    Groin pain:  We discussed possible OA and referral to Ortho but we decided to try  Meloxicam and changed shoe wear  ( OOFOS)  He states knee pain resolved but still has intermittent right groin pain that improved with Meloxicam that he takes about once a week and needs a refill today    Chronic calcified pancreatitis: found on CT abdomen done for evaluation of diverticulitis. No symptoms , does not need Creon   Dyslipidemia/Atherosclerosis of Aorta : he is taking Lovaza BID and atorvastatin, he denies  myalgia. Last LDL was 43 , he is also taking aspirin 81 mg. Recheck labs next visit    DMII with ED: he is taking Metformin 1500 mg daily, he  denies polyphagia, polydipsia or polyuria. He states Lyrica is working very well for neuropathy ,he is on Cialis for ED , A1C today is up from 6.1 % to 6.6 % . He has not been as compliant with his diet lately    Major Depressive Disorder/GAD  he is doing better, currently on Wellbutrin, Adderal, he takes alprazolam 1 mg prn.  He is taking Alprazolam prn last rx for 10 - last fill was Nov 2024. He is frustrated about recent biopsy on right cheek this week, but excited about his upcoming trip to [place] late Summer   Hearing loss: he has been to audiologist, he has tried otc hearing aids but it did not seem to work . Stable    BPH: currently doing well on cialis an flomax, he states once he started medication his urinary urgency resolved, nocturia only once per night, last PSA still at goal 1.28   Recent Fall: he was wearing socks going down the stairs last week , he slipped off the stairs, he had some scraping of his left elbow but did okay  otherwise. 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) 10/17/2018   Right ureteral stone 08/15/2018   Bilateral inguinal hernia without obstruction or gangrene 08/13/2018   Fatty liver 08/13/2018   Difficulty balancing 05/16/2017   Benign neoplasm of ascending colon    Benign neoplasm of transverse colon    Hearing loss, sensorineural, unilateral 10/21/2016   Dyslipidemia with low high density lipoprotein (HDL) cholesterol 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) 09/16/2016   GAD (generalized anxiety disorder) 09/16/2016   Genital herpes 06/08/2016   Dizziness 03/09/2016   History of nonmelanoma skin cancer 09/30/2015   Neck pain 07/29/2015   Anxiety 07/19/2015   Constipation 07/19/2015   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) 07/19/2015   Dyslipidemia 07/19/2015   Gastric reflux 07/19/2015   History of shingles 07/19/2015   History of basal cell cancer 07/19/2015   H/O Malignant melanoma 07/19/2015   Personal history of malignant  neoplasm of head and neck 07/19/2015   Chronic recurrent major depressive disorder (HCC) 07/19/2015   Obesity (BMI 30.0-34.9) 07/19/2015   ED (erectile dysfunction) 07/19/2015    Past Surgical History:  Procedure Laterality Date   ARM SKIN LESION BIOPSY / EXCISION Right 05/18/2017   COLONOSCOPY     COLONOSCOPY WITH PROPOFOL N/A 12/15/2016   Procedure: COLONOSCOPY WITH PROPOFOL;  Surgeon: Midge Minium, MD;  Location: [facility];  Service: Endoscopy;  Laterality: N/A;  diabetic - oral meds   COLONOSCOPY WITH PROPOFOL N/A 03/31/2020   Procedure: COLONOSCOPY WITH PROPOFOL;  Surgeon: Midge Minium, MD;   Location: [facility];  Service: Endoscopy;  Laterality: N/A;   ESOPHAGOGASTRODUODENOSCOPY (EGD) WITH PROPOFOL N/A 03/31/2020   Procedure: ESOPHAGOGASTRODUODENOSCOPY (EGD) WITH PROPOFOL;  Surgeon: Midge Minium, MD;  Location: [facility] ENDOSCOPY;  Service: Endoscopy;  Laterality: N/A;   NASAL SEPTUM SURGERY     POLYPECTOMY  12/15/2016   Procedure: POLYPECTOMY;  Surgeon: Midge Minium, MD;  Location: [facility];  Service: Endoscopy;;   SENTINEL NODE BIOPSY Right 05/18/2017   [facility]    Family History  Problem Relation Age of Onset   Diabetes Mother    Heart disease Mother    Hyperlipidemia Mother    CVA Father    Cancer Father        Colon   Depression Father     Social History   Tobacco Use   Smoking status: Never   Smokeless tobacco: Never   Tobacco comments:    Smoking cessation materials not required  Substance Use Topics   Alcohol use: Yes    Alcohol/week: 0.0 standard drinks of alcohol    Comment: Holidays     Current Outpatient Medications:    ALPRAZolam (XANAX) 1 MG tablet, Take 0.5-1 tablets (0.5-1 mg total) by mouth daily as needed for anxiety., Disp: 10 tablet, Rfl: 0   amphetamine-dextroamphetamine (ADDERALL) 15 MG tablet, Take 1 tablet by mouth 2 (two) times daily., Disp: 60 tablet, Rfl: 0   amphetamine-dextroamphetamine (ADDERALL) 15 MG tablet, Take 1 tablet by mouth 2 (two) times daily. Fill Nov 16 th 2024, Disp: 60 tablet, Rfl: 0   amphetamine-dextroamphetamine (ADDERALL) 15 MG tablet, Take 1 tablet by mouth 2 (two) times daily., Disp: 60 tablet, Rfl: 0   atorvastatin (LIPITOR) 80 MG tablet, Take 1 tablet (80 mg total) by mouth daily., Disp: 90 tablet, Rfl: 1   buPROPion (WELLBUTRIN XL) 300 MG 24 hr tablet, Take 1 tablet (300 mg total) by mouth daily., Disp: 90 tablet, Rfl: 1   cholecalciferol (VITAMIN D3) 25 MCG (1000 UNIT) tablet, Take 1,000 Units by mouth daily., Disp: , Rfl:    docusate sodium (COLACE) 100 MG capsule, Take 1 tablet once or twice daily as  needed for constipation while taking narcotic pain medicine, Disp: 30 capsule, Rfl: 0   famotidine (PEPCID) 20 MG tablet, Take 1 tablet (20 mg total) by mouth daily., Disp: 90 tablet, Rfl: 1   ferrous sulfate 325 (65 FE) MG EC tablet, Take 1 tablet (325 mg total) by mouth 2 (two) times daily., Disp: 180 tablet, Rfl: 0   meloxicam (MOBIC) 15 MG tablet, TAKE 1 TABLET BY MOUTH ONCE DAILY AS NEEDED FOR PAIN. TRY NOT TO TAKE DAILY, Disp: 90 tablet, Rfl: 0   metFORMIN (GLUCOPHAGE-XR) 750 MG 24 hr tablet, TAKE 2 TABLETS BY MOUTH ONCE DAILY WITH BREAKFAST, Disp: 180 tablet, Rfl: 1   omega-3 acid ethyl esters (LOVAZA) 1 g capsule, Take 2 capsules (2 g total) by mouth  2 (two) times daily., Disp: 360 capsule, Rfl: 1   omeprazole (PRILOSEC) 40 MG capsule, Take 1 capsule (40 mg total) by mouth daily., Disp: 90 capsule, Rfl: 1   pregabalin (LYRICA) 75 MG capsule, Take 1 capsule (75 mg total) by mouth 2 (two) times daily., Disp: 180 capsule, Rfl: 1   tadalafil (CIALIS) 5 MG tablet, Take 1 tablet (5 mg total) by mouth daily as needed for erectile dysfunction., Disp: 90 tablet, Rfl: 1   tamsulosin (FLOMAX) 0.4 MG CAPS capsule, Take 1 capsule (0.4 mg total) by mouth daily., Disp: 90 capsule, Rfl: 1   valACYclovir (VALTREX) 500 MG tablet, TAKE ONE CAPLET BY MOUTH THREE TIMES DAILY, Disp: 90 tablet, Rfl: 1  No Known Allergies  I personally reviewed active problem list, medication list, allergies, family history with the patient/caregiver today.   ROS  Ten systems reviewed and is negative except as mentioned in HPI    Objective  Vitals:   02/08/24 1125  BP: 130/74  Pulse: 77  Resp: 16  SpO2: 97%  Weight: 219 lb 6.4 oz (99.5 kg)  Height: 5\' 9"  (1.753 m)    Body mass index is 32.4 kg/m.  Physical Exam  Constitutional: Patient appears well-developed and well-nourished. Obese  No distress.  HEENT: head atraumatic, normocephalic, pupils equal and reactive to light, neck supple Cardiovascular: Normal  rate, regular rhythm and normal heart sounds.  No murmur heard. No BLE edema. Pulmonary/Chest: Effort normal and breath sounds normal. No respiratory distress. Abdominal: Soft.  There is no tenderness. Psychiatric: Patient has a normal mood and affect. behavior is normal. Judgment and thought content normal.   Recent Results (from the past 2160 hours)  POCT glycosylated hemoglobin (Hb A1C)     Status: Abnormal   Collection Time: 02/08/24 11:29 AM  Result Value Ref Range   Hemoglobin A1C 6.6 (A) 4.0 - 5.6 %   HbA1c POC (<> result, manual entry)     HbA1c, POC (prediabetic range)     HbA1c, POC (controlled diabetic range)      Diabetic Foot Exam:     PHQ2/9:    02/08/2024   11:24 AM 11/09/2023    1:10 PM 10/10/2023   11:47 AM 07/21/2023   11:28 AM 04/11/2023   10:04 AM  Depression screen PHQ 2/9  Decreased Interest 0 0 0 0 0  Down, Depressed, Hopeless 0 0 0 1 0  PHQ - 2 Score 0 0 0 1 0  Altered sleeping 0 0 0 0 0  Tired, decreased energy 0 0 0 0 0  Change in appetite 0 0 0 0 0  Feeling bad or failure about yourself  0 0 0 0 0  Trouble concentrating 0 0 0 0 0  Moving slowly or fidgety/restless 0 0 0 0 0  Suicidal thoughts 0 0 0 0 0  PHQ-9 Score 0 0 0 1 0  Difficult doing work/chores Not difficult at all        phq 9 is negative  Fall Risk:    02/08/2024   11:24 AM 11/09/2023    1:14 PM 10/10/2023   11:38 AM 09/25/2023   12:10 PM 07/21/2023   11:28 AM  Fall Risk   Falls in the past year? 1 0 0 0 0  Number falls in past yr: 0 0  0 0  Injury with Fall? 0 0  0 0  Risk for fall due to : Impaired balance/gait No Fall Risks No Fall Risks  No Fall Risks  Follow up Education provided;Falls evaluation completed;Falls prevention discussed Falls prevention discussed;Falls evaluation completed Falls prevention discussed  Falls prevention discussed     Assessment & Plan  1. Type 2 diabetes mellitus with diabetic neuropathy, without long-term current use of insulin (HCC)  (Primary)  - POCT glycosylated hemoglobin (Hb A1C)  2. Chronic recurrent major depressive disorder (HCC)  - amphetamine-dextroamphetamine (ADDERALL) 15 MG tablet; Take 1 tablet by mouth 2 (two) times daily. Fill Nov 16 th 2024  Dispense: 60 tablet; Refill: 0 - amphetamine-dextroamphetamine (ADDERALL) 15 MG tablet; Take 1 tablet by mouth 2 (two) times daily.  Dispense: 60 tablet; Refill: 0 - amphetamine-dextroamphetamine (ADDERALL) 15 MG tablet; Take 1 tablet by mouth 2 (two) times daily.  Dispense: 60 tablet; Refill: 0  3. Chronic calcific pancreatitis (HCC)  No symptoms at this time  4. Atherosclerosis of aorta (HCC)  On statin therapy  5. Dyslipidemia with low high density lipoprotein (HDL) cholesterol 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)  Continue medications, needs to resume diet  6. BPH associated with nocturia  Stable  7. GAD (generalized anxiety disorder)  Doing well on current regiment  8. Gastroesophageal reflux disease without esophagitis  Continue medications  9. Bilateral groin pain  - meloxicam (MOBIC) 15 MG tablet; Take 1 tablet (15 mg total) by mouth daily.  Dispense: 90 tablet; Refill: 0

## 2024-03-25 ENCOUNTER — Other Ambulatory Visit: Payer: Self-pay | Admitting: Family Medicine

## 2024-03-25 DIAGNOSIS — E1140 Type 2 diabetes mellitus with diabetic neuropathy, unspecified: Secondary | ICD-10-CM

## 2024-04-01 ENCOUNTER — Other Ambulatory Visit: Payer: Self-pay | Admitting: Family Medicine

## 2024-04-01 DIAGNOSIS — E1140 Type 2 diabetes mellitus with diabetic neuropathy, unspecified: Secondary | ICD-10-CM

## 2024-04-22 ENCOUNTER — Other Ambulatory Visit: Payer: Self-pay | Admitting: Family Medicine

## 2024-04-22 DIAGNOSIS — F339 Major depressive disorder, recurrent, unspecified: Secondary | ICD-10-CM

## 2024-05-23 ENCOUNTER — Ambulatory Visit: Admitting: Family Medicine

## 2024-05-23 ENCOUNTER — Encounter: Payer: Self-pay | Admitting: Family Medicine

## 2024-05-23 VITALS — BP 124/70 | HR 92 | Resp 16 | Ht 69.0 in | Wt 219.0 lb

## 2024-05-23 DIAGNOSIS — K861 Other chronic pancreatitis: Secondary | ICD-10-CM

## 2024-05-23 DIAGNOSIS — K219 Gastro-esophageal reflux disease without esophagitis: Secondary | ICD-10-CM

## 2024-05-23 DIAGNOSIS — F411 Generalized anxiety disorder: Secondary | ICD-10-CM

## 2024-05-23 DIAGNOSIS — I700 Atherosclerosis of aorta: Secondary | ICD-10-CM

## 2024-05-23 DIAGNOSIS — N401 Enlarged prostate with lower urinary tract symptoms: Secondary | ICD-10-CM

## 2024-05-23 DIAGNOSIS — R351 Nocturia: Secondary | ICD-10-CM

## 2024-05-23 DIAGNOSIS — F339 Major depressive disorder, recurrent, unspecified: Secondary | ICD-10-CM | POA: Diagnosis not present

## 2024-05-23 DIAGNOSIS — R1031 Right lower quadrant pain: Secondary | ICD-10-CM

## 2024-05-23 DIAGNOSIS — E782 Mixed hyperlipidemia: Secondary | ICD-10-CM

## 2024-05-23 DIAGNOSIS — E785 Hyperlipidemia, unspecified: Secondary | ICD-10-CM

## 2024-05-23 DIAGNOSIS — R1032 Left lower quadrant pain: Secondary | ICD-10-CM

## 2024-05-23 DIAGNOSIS — E1140 Type 2 diabetes mellitus with diabetic neuropathy, unspecified: Secondary | ICD-10-CM | POA: Diagnosis not present

## 2024-05-23 DIAGNOSIS — E1169 Type 2 diabetes mellitus with other specified complication: Secondary | ICD-10-CM

## 2024-05-23 MED ORDER — ATORVASTATIN CALCIUM 80 MG PO TABS: 80.0000 mg | ORAL_TABLET | Freq: Every day | ORAL | 1 refills | Status: DC

## 2024-05-23 MED ORDER — TADALAFIL 5 MG PO TABS: 5.0000 mg | ORAL_TABLET | Freq: Every day | ORAL | 1 refills | Status: DC | PRN

## 2024-05-23 MED ORDER — OMEGA-3-ACID ETHYL ESTERS 1 G PO CAPS: 2.0000 g | ORAL_CAPSULE | Freq: Two times a day (BID) | ORAL | 1 refills | Status: DC

## 2024-05-23 MED ORDER — PREGABALIN 100 MG PO CAPS: 100.0000 mg | ORAL_CAPSULE | Freq: Two times a day (BID) | ORAL | 1 refills | Status: DC

## 2024-05-23 MED ORDER — ALPRAZOLAM 1 MG PO TABS: 0.5000 mg | ORAL_TABLET | Freq: Every day | ORAL | 0 refills | Status: AC | PRN

## 2024-05-23 MED ORDER — TAMSULOSIN HCL 0.4 MG PO CAPS: 0.4000 mg | ORAL_CAPSULE | Freq: Every day | ORAL | 1 refills | Status: DC

## 2024-05-23 MED ORDER — AMPHETAMINE-DEXTROAMPHETAMINE 15 MG PO TABS: 15.0000 mg | ORAL_TABLET | Freq: Two times a day (BID) | ORAL | 0 refills | Status: DC

## 2024-05-23 MED ORDER — METFORMIN HCL ER 750 MG PO TB24: ORAL_TABLET | ORAL | 1 refills | Status: DC

## 2024-05-23 MED ORDER — OMEPRAZOLE 40 MG PO CPDR: 40.0000 mg | DELAYED_RELEASE_CAPSULE | Freq: Every day | ORAL | 1 refills | Status: DC

## 2024-05-23 MED ORDER — AMPHETAMINE-DEXTROAMPHETAMINE 15 MG PO TABS
15.0000 mg | ORAL_TABLET | Freq: Two times a day (BID) | ORAL | 0 refills | Status: DC
Start: 2024-05-23 — End: 2024-09-25

## 2024-05-23 MED ORDER — BUPROPION HCL ER (XL) 300 MG PO TB24: 300.0000 mg | ORAL_TABLET | Freq: Every day | ORAL | 1 refills | Status: DC

## 2024-05-23 MED ORDER — FAMOTIDINE 20 MG PO TABS: 20.0000 mg | ORAL_TABLET | Freq: Every day | ORAL | 1 refills | Status: DC

## 2024-05-23 MED ORDER — MELOXICAM 15 MG PO TABS: 15.0000 mg | ORAL_TABLET | Freq: Every day | ORAL | 0 refills | Status: AC

## 2024-05-23 NOTE — Progress Notes (Signed)
 Name: Shane Boyd.   MRN: [id number]    DOB: 1951/04/23   Date:05/23/2024       Progress Note  Subjective  Chief Complaint  Chief Complaint  Patient presents with   Medical Management of Chronic Issues   HPI   Dizziness: Discussed vertebral artery problems,  we discussed imaging like CTA and referral to neurologist in the past but not interested since symptoms resolved.   No episodes in a long time . Unchanged    GERD: he switched to Omeprazole  at night and H2 blocker in am's .  He is doing well with life style modification.   Groin pain:  We discussed possible OA and referral to Ortho but we decided to try  Meloxicam  and changed shoe wear  ( OOFOS) now also using [employer].  He states knee pain also improved     Chronic calcified pancreatitis: found on CT abdomen done for evaluation of diverticulitis. No symptoms and lipase normal in the past.       Dyslipidemia/Atherosclerosis of Aorta : he is taking Lovaza  BID and atorvastatin , he denies  myalgia. Last LDL was 43 , he is also taking aspirin  81 mg. Recheck labs    DMII with ED: he is taking Metformin  1500 mg daily, he  denies polyphagia, polydipsia or polyuria. He states Lyrica  is working very well for neuropathy,he is on Cialis  for ED , A1C has been at goal, last visit it was 6.1 %  Major Depressive Disorder/GAD  he is doing better, currently on Wellbutrin , Adderal, he takes alprazolam  1 mg prn.  He is taking Alprazolam  prn last rx for 10 - last fill was January 2024, he needs a refill today. His mood is good at this time    Hearing loss: he has been to audiologist, he has tried otc hearing aids but it did not seem to work . 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) 10/17/2018   Right ureteral stone 08/15/2018   Bilateral inguinal hernia without obstruction or gangrene 08/13/2018   Fatty liver 08/13/2018   Difficulty balancing 05/16/2017   Benign neoplasm of ascending colon    Benign neoplasm of transverse colon    Hearing loss, sensorineural, unilateral 10/21/2016   Dyslipidemia with low high density lipoprotein (HDL) cholesterol 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) 09/16/2016   GAD (generalized anxiety disorder) 09/16/2016   Genital herpes 06/08/2016   Dizziness 03/09/2016   History of nonmelanoma skin cancer 09/30/2015   Neck pain 07/29/2015   Anxiety 07/19/2015   Constipation 07/19/2015   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) 07/19/2015   Dyslipidemia 07/19/2015   Gastric reflux 07/19/2015   History of shingles 07/19/2015   History of basal cell cancer 07/19/2015   H/O Malignant melanoma 07/19/2015   Personal history of malignant neoplasm of head and neck 07/19/2015   Chronic recurrent major depressive disorder (HCC) 07/19/2015   Obesity (BMI 30.0-34.9)  07/19/2015   ED (erectile dysfunction) 07/19/2015    Past Surgical History:  Procedure Laterality Date   ARM SKIN LESION BIOPSY / EXCISION Right 05/18/2017   COLONOSCOPY     COLONOSCOPY WITH PROPOFOL  N/A 12/15/2016   Procedure: COLONOSCOPY WITH PROPOFOL ;  Surgeon: Marnee Sink, MD;  Location: [facility];  Service: Endoscopy;  Laterality: N/A;  diabetic - oral meds   COLONOSCOPY WITH PROPOFOL  N/A 03/31/2020   Procedure: COLONOSCOPY WITH PROPOFOL ;  Surgeon: Marnee Sink, MD;  Location: [facility];  Service: Endoscopy;  Laterality: N/A;   ESOPHAGOGASTRODUODENOSCOPY (EGD) WITH  PROPOFOL  N/A 03/31/2020   Procedure: ESOPHAGOGASTRODUODENOSCOPY (EGD) WITH PROPOFOL ;  Surgeon: Marnee Sink, MD;  Location: [facility] ENDOSCOPY;  Service: Endoscopy;  Laterality: N/A;   NASAL SEPTUM SURGERY     POLYPECTOMY  12/15/2016   Procedure: POLYPECTOMY;  Surgeon: Marnee Sink, MD;  Location: [facility];  Service: Endoscopy;;   SENTINEL NODE BIOPSY Right 05/18/2017   [facility]    Family History  Problem Relation Age of Onset   Diabetes Mother    Heart disease Mother    Hyperlipidemia Mother    CVA Father    Cancer Father        Colon   Depression Father    Anxiety disorder Father     Social History   Tobacco Use   Smoking status: Never   Smokeless tobacco: Never   Tobacco comments:    Smoking cessation materials not required  Substance Use Topics   Alcohol use: Yes    Alcohol/week: 0.0 standard drinks of alcohol    Comment: Holidays     Current Outpatient Medications:    ALPRAZolam  (XANAX ) 1 MG tablet, Take 0.5-1 tablets (0.5-1 mg total) by mouth daily as needed for anxiety., Disp: 10 tablet, Rfl: 0   amphetamine -dextroamphetamine  (ADDERALL) 15 MG tablet, Take 1 tablet by mouth 2 (two) times daily. Fill Nov 16 th 2024, Disp: 60 tablet, Rfl: 0   amphetamine -dextroamphetamine  (ADDERALL) 15 MG tablet, Take 1 tablet by mouth 2 (two) times daily., Disp: 60 tablet, Rfl: 0   amphetamine -dextroamphetamine  (ADDERALL) 15 MG tablet, Take 1 tablet by mouth 2 (two) times daily., Disp: 60 tablet, Rfl: 0   atorvastatin  (LIPITOR) 80 MG tablet, Take 1 tablet (80 mg total) by mouth daily., Disp: 90 tablet, Rfl: 1   buPROPion  (WELLBUTRIN  XL) 300 MG 24 hr tablet, Take 1 tablet by mouth once daily, Disp: 30 tablet, Rfl: 0   cholecalciferol (VITAMIN D3) 25 MCG (1000 UNIT) tablet, Take 1,000 Units by mouth daily., Disp: , Rfl:    docusate sodium  (COLACE) 100 MG capsule, Take 1 tablet once or twice daily as needed for constipation while taking narcotic pain medicine, Disp: 30 capsule, Rfl: 0    famotidine  (PEPCID ) 20 MG tablet, Take 1 tablet (20 mg total) by mouth daily., Disp: 90 tablet, Rfl: 1   ferrous sulfate  325 (65 FE) MG EC tablet, Take 1 tablet (325 mg total) by mouth 2 (two) times daily., Disp: 180 tablet, Rfl: 0   meloxicam  (MOBIC ) 15 MG tablet, Take 1 tablet (15 mg total) by mouth daily., Disp: 90 tablet, Rfl: 0   metFORMIN  (GLUCOPHAGE -XR) 750 MG 24 hr tablet, TAKE 2 TABLETS BY MOUTH ONCE DAILY WITH BREAKFAST, Disp: 180 tablet, Rfl: 0   omega-3 acid ethyl esters (LOVAZA ) 1 g capsule, Take 2 capsules (2 g total) by mouth 2 (two) times daily., Disp: 360 capsule, Rfl: 1   omeprazole  (PRILOSEC) 40 MG capsule, Take 1 capsule (40 mg total) by  mouth daily., Disp: 90 capsule, Rfl: 1   pregabalin  (LYRICA ) 75 MG capsule, Take 1 capsule by mouth twice daily, Disp: 180 capsule, Rfl: 0   tadalafil  (CIALIS ) 5 MG tablet, Take 1 tablet (5 mg total) by mouth daily as needed for erectile dysfunction., Disp: 90 tablet, Rfl: 1   tamsulosin  (FLOMAX ) 0.4 MG CAPS capsule, Take 1 capsule (0.4 mg total) by mouth daily., Disp: 90 capsule, Rfl: 1   valACYclovir  (VALTREX ) 500 MG tablet, TAKE ONE CAPLET BY MOUTH THREE TIMES DAILY, Disp: 90 tablet, Rfl: 1  No Known Allergies  I personally reviewed active problem list, medication list, allergies with the patient/caregiver today.   ROS  Ten systems reviewed and is negative except as mentioned in HPI    Objective Physical Exam Constitutional: Patient appears well-developed and well-nourished. Obese  No distress.  HEENT: head atraumatic, normocephalic, pupils equal and reactive to light, neck supple Cardiovascular: Normal rate, regular rhythm and normal heart sounds.  No murmur heard. No BLE edema. Pulmonary/Chest: Effort normal and breath sounds normal. No respiratory distress. Abdominal: Soft.  There is no tenderness. Psychiatric: Patient has a normal mood and affect. behavior is normal. Judgment and thought content normal.    VITALS: BP-  124/70 MEASUREMENTS: Weight- exactly the same. CONSTITUTIONAL: Patient appears well-developed and well-nourished.  No distress. HEENT: Head atraumatic, normocephalic, neck supple. CARDIOVASCULAR: Normal rate, regular rhythm and normal heart sounds.  No murmur heard. No BLE edema. PULMONARY: Effort normal and breath sounds normal. No respiratory distress. ABDOMINAL: There is no tenderness or distention. MUSCULOSKELETAL: Normal gait. Without gross motor or sensory deficit. PSYCHIATRIC: Patient has a normal mood and affect. behavior is normal. Judgment and thought content normal.  Vitals:   05/23/24 1100  BP: 124/70  Pulse: 92  Resp: 16  SpO2: 97%  Weight: 219 lb (99.3 kg)  Height: 5' 9 (1.753 m)    Body mass index is 32.34 kg/m.    PHQ2/9:    05/23/2024   11:00 AM 02/08/2024   11:24 AM 11/09/2023    1:10 PM 10/10/2023   11:47 AM 07/21/2023   11:28 AM  Depression screen PHQ 2/9  Decreased Interest 0 0 0 0 0  Down, Depressed, Hopeless 0 0 0 0 1  PHQ - 2 Score 0 0 0 0 1  Altered sleeping 0 0 0 0 0  Tired, decreased energy 0 0 0 0 0  Change in appetite 0 0 0 0 0  Feeling bad or failure about yourself  0 0 0 0 0  Trouble concentrating 0 0 0 0 0  Moving slowly or fidgety/restless 0 0 0 0 0  Suicidal thoughts 0 0 0 0 0  PHQ-9 Score 0 0 0 0 1  Difficult doing work/chores Not difficult at all Not difficult at all       phq 9 is negative  Fall Risk:    05/23/2024   10:56 AM 02/08/2024   11:24 AM 11/09/2023    1:14 PM 10/10/2023   11:38 AM 09/25/2023   12:10 PM  Fall Risk   Falls in the past year? 0 1 0 0 0  Number falls in past yr: 0 0 0  0  Injury with Fall? 0 0 0  0  Risk for fall due to : No Fall Risks Impaired balance/gait No Fall Risks No Fall Risks   Follow up Falls prevention discussed;Education provided;Falls evaluation completed Education provided;Falls evaluation completed;Falls prevention discussed Falls prevention discussed;Falls evaluation completed Falls  prevention discussed  Assessment & Plan Major depressive disorder recurrent  and generalized anxiety disorder Well-controlled on Wellbutrin  and Adderall. Alprazolam  needed for travel-related anxiety and sleep disturbances. - Continue Wellbutrin  300 mg daily. - Prescribe Adderall 15 mg twice daily with three refills. - Prescribe alprazolam  1 mg, dispense 10 tablets for travel-related anxiety and sleep disturbances.  Osteoarthritis Groin pain likely due to osteoarthritis. Managed with meloxicam  and Ovace. [employer] beneficial. Knee pain exacerbated by certain activities. - Continue meloxicam  as needed for pain. - Use Ovace as needed. - Encourage use of [employer] for support. - Advise using a table for painting to avoid knee strain.  Peripheral neuropathy Managed with Lyrica . Recommended dose increase for better symptom control. - Increase Lyrica  to 100 mg twice daily. - Ensure adequate supply of Lyrica  for travel.  Type 2 diabetes mellitus with dyslipidemia and ED Well-controlled with metformin . A1c stable. - Continue metformin  750 mg twice daily. - Order blood work to reassess A1c levels.  Dyslipidemia and aorta  atherosclerosis Well-managed with Lovaza  and atorvastatin . LDL cholesterol at 43 mg/dL. - Continue Lovaza  twice daily. - Continue atorvastatin  as prescribed. - Order blood work to reassess lipid levels.  Chronic Calcified pancreatitis -discussed symptoms  Gastroesophageal reflux disease (GERD) Well-controlled with omeprazole . Lifestyle modifications effective. - Continue omeprazole  as prescribed. - Maintain lifestyle modifications to prevent reflux.  BPH with nocturia -continue current regiment

## 2024-05-24 ENCOUNTER — Ambulatory Visit: Payer: Self-pay | Admitting: Family Medicine

## 2024-05-24 LAB — MICROALBUMIN / CREATININE URINE RATIO
Creatinine, Urine: 140 mg/dL (ref 20–320)
Microalb Creat Ratio: 3 mg/g{creat} (ref <30)
Microalb, Ur: 0.4 mg/dL

## 2024-05-24 LAB — LIPID PANEL
Cholesterol: 103 mg/dL (ref <200)
HDL: 42 mg/dL (ref >=40)
LDL Cholesterol (Calc): 35 mg/dL
Non-HDL Cholesterol (Calc): 61 mg/dL (ref <130)
Total CHOL/HDL Ratio: 2.5 (calc) (ref <5.0)
Triglycerides: 187 mg/dL — ABNORMAL HIGH (ref <150)

## 2024-05-24 LAB — COMPREHENSIVE METABOLIC PANEL WITH GFR
AG Ratio: 2.2 (calc) (ref 1.0–2.5)
ALT: 17 U/L (ref 9–46)
AST: 19 U/L (ref 10–35)
Albumin: 4.6 g/dL (ref 3.6–5.1)
Alkaline phosphatase (APISO): 86 U/L (ref 35–144)
BUN: 17 mg/dL (ref 7–25)
CO2: 28 mmol/L (ref 20–32)
Calcium: 9.4 mg/dL (ref 8.6–10.3)
Chloride: 102 mmol/L (ref 98–110)
Creat: 1.12 mg/dL (ref 0.70–1.28)
Globulin: 2.1 g/dL (ref 1.9–3.7)
Glucose, Bld: 101 mg/dL — ABNORMAL HIGH (ref 65–99)
Potassium: 5 mmol/L (ref 3.5–5.3)
Sodium: 138 mmol/L (ref 135–146)
Total Bilirubin: 0.6 mg/dL (ref 0.2–1.2)
Total Protein: 6.7 g/dL (ref 6.1–8.1)
eGFR: 69 mL/min/{1.73_m2} (ref >=60)

## 2024-05-24 LAB — CBC WITH DIFFERENTIAL/PLATELET
Absolute Lymphocytes: 2171 {cells}/uL (ref 850–3900)
Absolute Monocytes: 523 {cells}/uL (ref 200–950)
Basophils Absolute: 27 {cells}/uL (ref 0–200)
Basophils Relative: 0.4 %
Eosinophils Absolute: 281 {cells}/uL (ref 15–500)
Eosinophils Relative: 4.2 %
HCT: 40.3 % (ref 38.5–50.0)
Hemoglobin: 13.3 g/dL (ref 13.2–17.1)
MCH: 28.3 pg (ref 27.0–33.0)
MCHC: 33 g/dL (ref 32.0–36.0)
MCV: 85.7 fL (ref 80.0–100.0)
MPV: 12.1 fL (ref 7.5–12.5)
Monocytes Relative: 7.8 %
Neutro Abs: 3698 {cells}/uL (ref 1500–7800)
Neutrophils Relative %: 55.2 %
Platelets: 181 10*3/uL (ref 140–400)
RBC: 4.7 10*6/uL (ref 4.20–5.80)
RDW: 14.5 % (ref 11.0–15.0)
Total Lymphocyte: 32.4 %
WBC: 6.7 10*3/uL (ref 3.8–10.8)

## 2024-05-24 LAB — PSA: PSA: 1.88 ng/mL (ref <=4.00)

## 2024-07-22 ENCOUNTER — Encounter: Payer: Self-pay | Admitting: Family Medicine

## 2024-07-23 ENCOUNTER — Encounter: Payer: Self-pay | Admitting: Family Medicine

## 2024-08-05 ENCOUNTER — Encounter: Payer: Self-pay | Admitting: Family Medicine

## 2024-08-06 ENCOUNTER — Ambulatory Visit (LOCAL_COMMUNITY_HEALTH_CENTER)

## 2024-08-06 DIAGNOSIS — Z23 Encounter for immunization: Secondary | ICD-10-CM | POA: Diagnosis not present

## 2024-08-06 DIAGNOSIS — Z719 Counseling, unspecified: Secondary | ICD-10-CM

## 2024-08-06 NOTE — Progress Notes (Signed)
 Patient seen in nurse clinic for flu and COVID vaccine.  Patient traveling to [place] in 2 weeks and wants to have flu and COVID vaccines before traveling. Discussed the COVID vaccine currently available is the same formulation as last fall.  Patient still wants to get 2024-25 COVID vaccine.  Comirnaty +12 YR and Fluzone HD given and tolerated well. VIS provided. [facility] updated and copy provided.

## 2024-09-09 DIAGNOSIS — D044 Carcinoma in situ of skin of scalp and neck: Secondary | ICD-10-CM | POA: Diagnosis not present

## 2024-09-09 DIAGNOSIS — L570 Actinic keratosis: Secondary | ICD-10-CM | POA: Diagnosis not present

## 2024-09-10 ENCOUNTER — Encounter: Payer: Self-pay | Admitting: Family Medicine

## 2024-09-10 ENCOUNTER — Other Ambulatory Visit: Payer: Self-pay | Admitting: Family Medicine

## 2024-09-10 DIAGNOSIS — F339 Major depressive disorder, recurrent, unspecified: Secondary | ICD-10-CM

## 2024-09-10 MED ORDER — AMPHETAMINE-DEXTROAMPHETAMINE 15 MG PO TABS: 15.0000 mg | ORAL_TABLET | Freq: Two times a day (BID) | ORAL | 0 refills | Status: DC

## 2024-09-23 DIAGNOSIS — H2513 Age-related nuclear cataract, bilateral: Secondary | ICD-10-CM | POA: Diagnosis not present

## 2024-09-23 DIAGNOSIS — E119 Type 2 diabetes mellitus without complications: Secondary | ICD-10-CM | POA: Diagnosis not present

## 2024-09-23 DIAGNOSIS — H43391 Other vitreous opacities, right eye: Secondary | ICD-10-CM | POA: Diagnosis not present

## 2024-09-23 LAB — OPHTHALMOLOGY REPORT-SCANNED

## 2024-09-25 ENCOUNTER — Ambulatory Visit: Admitting: Family Medicine

## 2024-09-25 ENCOUNTER — Encounter: Payer: Self-pay | Admitting: Family Medicine

## 2024-09-25 VITALS — BP 130/74 | HR 97 | Resp 16 | Ht 69.0 in | Wt 221.9 lb

## 2024-09-25 DIAGNOSIS — F411 Generalized anxiety disorder: Secondary | ICD-10-CM

## 2024-09-25 DIAGNOSIS — E782 Mixed hyperlipidemia: Secondary | ICD-10-CM | POA: Diagnosis not present

## 2024-09-25 DIAGNOSIS — E1169 Type 2 diabetes mellitus with other specified complication: Secondary | ICD-10-CM | POA: Diagnosis not present

## 2024-09-25 DIAGNOSIS — K861 Other chronic pancreatitis: Secondary | ICD-10-CM

## 2024-09-25 DIAGNOSIS — Z7984 Long term (current) use of oral hypoglycemic drugs: Secondary | ICD-10-CM | POA: Diagnosis not present

## 2024-09-25 DIAGNOSIS — F325 Major depressive disorder, single episode, in full remission: Secondary | ICD-10-CM

## 2024-09-25 DIAGNOSIS — K219 Gastro-esophageal reflux disease without esophagitis: Secondary | ICD-10-CM

## 2024-09-25 DIAGNOSIS — I700 Atherosclerosis of aorta: Secondary | ICD-10-CM

## 2024-09-25 DIAGNOSIS — E1140 Type 2 diabetes mellitus with diabetic neuropathy, unspecified: Secondary | ICD-10-CM

## 2024-09-25 LAB — POCT GLYCOSYLATED HEMOGLOBIN (HGB A1C): Hemoglobin A1C: 6.5 % — AB (ref 4.0–5.6)

## 2024-09-25 MED ORDER — AMPHETAMINE-DEXTROAMPHETAMINE 15 MG PO TABS: 15.0000 mg | ORAL_TABLET | Freq: Two times a day (BID) | ORAL | 0 refills | Status: DC

## 2024-09-25 NOTE — Progress Notes (Addendum)
 Name: Shane Boyd.   MRN: [id number]    DOB: [birth year]   Date:09/25/2024       Progress Note  Subjective  Chief Complaint  Chief Complaint  Patient presents with   Medical Management of Chronic Issues   Discussed the use of AI scribe software for clinical note transcription with the patient, who gave verbal consent to proceed.  History of Present Illness Shane Boyd. Shane Boyd is a 73 year old male who presents for a follow-up visit.  He recently traveled to [place], [place], and [place], which involved more walking than usual, leading to increased physical exertion. On one day in [place], he experienced significant hip discomfort due to extensive walking, but he completed the trip without major issues.  He has type 2 diabetes managed with metformin  750 mg twice daily. His A1c has slightly improved from 6.6 to 6.5. During his trip, he had difficulty finding places to eat, which may have contributed to stable blood sugar levels. No symptoms of hyperglycemia such as increased hunger, thirst, or frequent urination.  He experiences diabetic neuropathy and takes pregabalin  100 mg twice daily, which effectively manages symptoms of burning and pain. He previously had a callus on his foot, which has been removed.  He manages dyslipidemia with atorvastatin  80 mg and Lovaza . His LDL cholesterol was 35 mg/dL, and his triglycerides were slightly elevated; he may not have been fasting during the test.  He has a history of atherosclerosis and takes cholesterol medication. He also experiences gastroesophageal reflux disease (GERD) and takes omeprazole  before dinner and Pepcid  at night.  He has a history of anxiety and major depression and takes Wellbutrin  300 mg daily and occasionally uses alprazolam . He also takes Adderall 15 mg BID for motivation and reports no side effects from these medications.  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) 09/16/2016   GAD (generalized anxiety disorder) 09/16/2016   Genital herpes 06/08/2016   Dizziness 03/09/2016   History of nonmelanoma skin cancer 09/30/2015   Neck pain 07/29/2015   Constipation 07/19/2015   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) 07/19/2015   Gastric reflux 07/19/2015   History of shingles 07/19/2015   History of basal cell cancer 07/19/2015   H/O Malignant melanoma 07/19/2015   Personal history of malignant neoplasm of head and neck 07/19/2015   Chronic recurrent major depressive disorder 07/19/2015   Obesity (BMI 30.0-34.9) 07/19/2015   ED (erectile dysfunction) 07/19/2015    Past Surgical History:  Procedure Laterality Date   ARM SKIN LESION BIOPSY / EXCISION Right 05/18/2017   COLONOSCOPY     COLONOSCOPY WITH PROPOFOL  N/A 12/15/2016   Procedure: COLONOSCOPY WITH PROPOFOL ;  Surgeon: Rogelia Copping, MD;  Location: [facility];  Service: Endoscopy;  Laterality: N/A;  diabetic - oral meds   COLONOSCOPY WITH PROPOFOL  N/A 03/31/2020   Procedure: COLONOSCOPY WITH PROPOFOL ;  Surgeon: Jinny Carmine, MD;  Location: [facility];  Service: Endoscopy;  Laterality: N/A;    ESOPHAGOGASTRODUODENOSCOPY (EGD) WITH PROPOFOL  N/A 03/31/2020   Procedure: ESOPHAGOGASTRODUODENOSCOPY (EGD) WITH PROPOFOL ;  Surgeon: Jinny Carmine, MD;  Location: [facility];  Service: Endoscopy;  Laterality: N/A;   NASAL SEPTUM SURGERY     POLYPECTOMY  12/15/2016   Procedure: POLYPECTOMY;  Surgeon: Carmine Jinny, MD;  Location: [facility];  Service: Endoscopy;;   SENTINEL NODE BIOPSY Right 05/18/2017   [facility]    Family History  Problem Relation Age of Onset   Diabetes Mother    Heart disease Mother    Hyperlipidemia Mother    CVA Father    Cancer Father        Colon   Depression Father    Anxiety disorder Father     Social History   Tobacco Use   Smoking status: Never   Smokeless tobacco: Never   Tobacco comments:    Smoking cessation materials not required  Substance Use Topics   Alcohol use: Yes    Alcohol/week: 0.0 standard drinks of alcohol    Comment: Holidays     Current Outpatient Medications:    ALPRAZolam  (XANAX ) 1 MG tablet, Take 0.5-1 tablets (0.5-1 mg total) by mouth daily as needed for anxiety., Disp: 10 tablet, Rfl: 0   amphetamine -dextroamphetamine  (ADDERALL) 15 MG tablet, Take 1 tablet by mouth 2 (two) times daily., Disp: 60 tablet, Rfl: 0   amphetamine -dextroamphetamine  (ADDERALL) 15 MG tablet, Take 1 tablet by mouth 2 (two) times daily. Fill Nov 16 th 2024, Disp: 60 tablet, Rfl: 0   amphetamine -dextroamphetamine  (ADDERALL) 15 MG tablet, Take 1 tablet by mouth 2 (two) times daily., Disp: 60 tablet, Rfl: 0   atorvastatin  (LIPITOR) 80 MG tablet, Take 1 tablet (80 mg total) by mouth daily., Disp: 90 tablet, Rfl: 1   buPROPion  (WELLBUTRIN  XL) 300 MG 24 hr tablet, Take 1 tablet (300 mg total) by mouth daily., Disp: 90 tablet, Rfl: 1   cholecalciferol (VITAMIN D3) 25 MCG (1000 UNIT) tablet, Take 1,000 Units by mouth daily., Disp: , Rfl:    docusate sodium  (COLACE) 100 MG capsule, Take 1 tablet once or twice daily as needed for constipation while taking  narcotic pain medicine, Disp: 30 capsule, Rfl: 0   famotidine  (PEPCID ) 20 MG tablet, Take 1 tablet (20 mg total) by mouth daily., Disp: 90 tablet, Rfl: 1   ferrous sulfate  325 (65 FE) MG EC tablet, Take 1 tablet (325 mg total) by mouth 2 (two) times daily., Disp: 180 tablet, Rfl: 0   meloxicam  (MOBIC ) 15 MG tablet, Take 1 tablet (15 mg total) by mouth daily., Disp: 90 tablet, Rfl: 0   metFORMIN  (GLUCOPHAGE -XR) 750 MG 24 hr tablet, TAKE 2 TABLETS BY MOUTH ONCE DAILY WITH BREAKFAST, Disp: 180 tablet, Rfl: 1   omega-3 acid ethyl esters (LOVAZA ) 1 g capsule, Take 2 capsules (2 g total) by mouth 2 (two) times daily., Disp: 360 capsule, Rfl: 1   omeprazole  (PRILOSEC) 40 MG capsule, Take 1 capsule (40 mg total) by mouth daily., Disp: 90 capsule, Rfl: 1   pregabalin  (LYRICA ) 100 MG capsule, Take 1 capsule (100 mg total) by mouth 2 (two) times daily., Disp: 180 capsule, Rfl: 1   tadalafil  (CIALIS ) 5 MG tablet, Take 1 tablet (5 mg total) by mouth daily as needed  for erectile dysfunction., Disp: 90 tablet, Rfl: 1   tamsulosin  (FLOMAX ) 0.4 MG CAPS capsule, Take 1 capsule (0.4 mg total) by mouth daily., Disp: 90 capsule, Rfl: 1   valACYclovir  (VALTREX ) 500 MG tablet, TAKE ONE CAPLET BY MOUTH THREE TIMES DAILY, Disp: 90 tablet, Rfl: 1  No Known Allergies  I personally reviewed active problem list, medication list, allergies, family history with the patient/caregiver today.   ROS  Ten systems reviewed and is negative except as mentioned in HPI    Objective Physical Exam CONSTITUTIONAL: Patient appears well-developed and well-nourished.  No distress. HEENT: Head atraumatic, normocephalic, neck supple. CARDIOVASCULAR: Normal rate, regular rhythm and normal heart sounds.  No murmur heard. No BLE edema. PULMONARY: Effort normal and breath sounds normal. No respiratory distress. ABDOMINAL: There is no tenderness or distention. MUSCULOSKELETAL: Normal gait. Without gross motor or sensory  deficit. PSYCHIATRIC: Patient has a normal mood and affect. behavior is normal. Judgment and thought content normal.  Vitals:   09/25/24 1017  BP: 130/74  Pulse: 97  Resp: 16  SpO2: 97%  Weight: 221 lb 14.4 oz (100.7 kg)  Height: 5' 9 (1.753 m)    Body mass index is 32.77 kg/m.  Recent Results (from the past 2160 hours)  OPHTHALMOLOGY REPORT-SCANNED     Status: None   Collection Time: 09/23/24 12:00 AM  Result Value Ref Range   HM Diabetic Eye Exam No Retinopathy No Retinopathy    Comment: Abstracted by HIM  POCT glycosylated hemoglobin (Hb A1C)     Status: Abnormal   Collection Time: 09/25/24 10:24 AM  Result Value Ref Range   Hemoglobin A1C 6.5 (A) 4.0 - 5.6 %   HbA1c POC (<> result, manual entry)     HbA1c, POC (prediabetic range)     HbA1c, POC (controlled diabetic range)      Diabetic Foot Exam:     PHQ2/9:    09/25/2024   10:13 AM 05/23/2024   11:00 AM 02/08/2024   11:24 AM 11/09/2023    1:10 PM 10/10/2023   11:47 AM  Depression screen PHQ 2/9  Decreased Interest 0 0 0 0 0  Down, Depressed, Hopeless 0 0 0 0 0  PHQ - 2 Score 0 0 0 0 0  Altered sleeping 0 0 0 0 0  Tired, decreased energy 0 0 0 0 0  Change in appetite 0 0 0 0 0  Feeling bad or failure about yourself  0 0 0 0 0  Trouble concentrating 0 0 0 0 0  Moving slowly or fidgety/restless 0 0 0 0 0  Suicidal thoughts 0 0 0 0 0  PHQ-9 Score 0 0 0 0 0  Difficult doing work/chores Not difficult at all Not difficult at all Not difficult at all      phq 9 is negative  Fall Risk:    09/25/2024   10:13 AM 05/23/2024   10:56 AM 02/08/2024   11:24 AM 11/09/2023    1:14 PM 10/10/2023   11:38 AM  Fall Risk   Falls in the past year? 0 0 1 0 0  Number falls in past yr: 0 0 0 0   Injury with Fall? 0 0 0 0   Risk for fall due to : No Fall Risks No Fall Risks Impaired balance/gait No Fall Risks No Fall Risks  Follow up Falls evaluation completed Falls prevention discussed;Education provided;Falls evaluation  completed Education provided;Falls evaluation completed;Falls prevention discussed Falls prevention discussed;Falls evaluation completed Falls prevention discussed  Assessment & Plan Type 2 diabetes mellitus with diabetic neuropathy and dyslipidemia Diabetes well-controlled, A1c 6.5. Neuropathy managed with pregabalin . Dyslipidemia managed with atorvastatin  and Lovaza , LDL at goal, triglycerides slightly elevated. - Continue metformin  750 mg twice daily. - Continue pregabalin  100 mg twice daily. - Continue atorvastatin  80 mg daily. - Continue Lovaza  as prescribed. - Ensure refills for medications are available. - Monitor A1c and lipid levels regularly. - Follow up on eye exam results.  Atherosclerosis of the aorta Managed with atorvastatin  to control cholesterol. - Continue atorvastatin  80 mg daily.  Major depressive disorder and anxiety disorder Well-managed with Wellbutrin , alprazolam , and Adderall. - Continue Wellbutrin  300 mg daily. - Use alprazolam  as needed for anxiety. - Continue Adderall 50 mg daily. - Ensure refills for Adderall are available.  Gastroesophageal reflux disease Managed with omeprazole  and Pepcid . - Continue omeprazole  before dinner. - Continue Pepcid  at night.  Skin cancer Follow-up with specialist for management. Recent treatment involved scraping and burning of squamous cell carcinoma. - Continue follow-up with specialist for skin cancer management.

## 2024-11-21 ENCOUNTER — Ambulatory Visit: Payer: Self-pay

## 2024-11-21 DIAGNOSIS — Z0000 Encounter for general adult medical examination without abnormal findings: Secondary | ICD-10-CM | POA: Diagnosis not present

## 2024-11-21 NOTE — Progress Notes (Signed)
 Chief Complaint  Patient presents with   Medicare Wellness     Subjective:   Shane Vento. is a 73 y.o. male who presents for a Medicare Annual Wellness Visit.  Visit info / Clinical Intake: Medicare Wellness Visit Type:: Subsequent Annual Wellness Visit Persons participating in visit and providing information:: patient Medicare Wellness Visit Mode:: Video Since this visit was completed virtually, some vitals may be partially provided or unavailable. Missing vitals are due to the limitations of the virtual format.: Unable to obtain vitals - no equipment If Telephone or Video please confirm:: I connected with patient using audio/video enable telemedicine. I verified patient identity with two identifiers, discussed telehealth limitations, and patient agreed to proceed. Patient Location:: HOME Provider Location:: OFFICE Interpreter Needed?: No Pre-visit prep was completed: yes AWV questionnaire completed by patient prior to visit?: yes Date:: 11/21/24 Living arrangements:: lives with spouse/significant other Patient's Overall Health Status Rating: very good Typical amount of pain: some (HIP, KNEE PAIN) Does pain affect daily life?: no Are you currently prescribed opioids?: no  Dietary Habits and Nutritional Risks How many meals a day?: 3 Eats fruit and vegetables daily?: yes Most meals are obtained by: preparing own meals In the last 2 weeks, have you had any of the following?: (!) nausea, vomiting, diarrhea (NAUSEA, VERTIGO) Diabetic:: (!) yes Any non-healing wounds?: no How often do you check your BS?: as needed Would you like to be referred to a Nutritionist or for Diabetic Management? : no  Functional Status Activities of Daily Living (to include ambulation/medication): Independent Ambulation: Independent Medication Administration: Independent Home Management (perform basic housework or laundry): Independent Manage your own finances?: yes Primary transportation  is: driving Concerns about vision?: no *vision screening is required for [facility]* (WEARS GLASSES ALL DAY- DR.WOODARD) Concerns about hearing?: (!) yes Uses hearing aids?: no Hear whispered voice?: yes  Fall Screening Falls in the past year?: 1 Number of falls in past year: 0 Was there an injury with Fall?: 0 Fall Risk Category Calculator: 1 Patient Fall Risk Level: Low Fall Risk  Fall Risk Patient at Risk for Falls Due to: No Fall Risks Fall risk Follow up: Falls evaluation completed; Falls prevention discussed  Home and Transportation Safety: All rugs have non-skid backing?: yes All stairs or steps have railings?: yes Grab bars in the bathtub or shower?: (!) no Have non-skid surface in bathtub or shower?: yes Good home lighting?: yes Regular seat belt use?: yes Hospital stays in the last year:: no  Cognitive Assessment Difficulty concentrating, remembering, or making decisions? : no Will 6CIT or Mini Cog be Completed: yes What year is it?: 0 points What month is it?: 0 points Give patient an address phrase to remember (5 components): [address] , [place] About what time is it?: 0 points Count backwards from 20 to 1: 0 points Say the months of the year in reverse: 0 points Repeat the address phrase from earlier: 0 points 6 CIT Score: 0 points  Advance Directives (For Healthcare) Does Patient Have a Medical Advance Directive?: No Would patient like information on creating a medical advance directive?: No - Patient declined  Reviewed/Updated  Reviewed/Updated: Reviewed All (Medical, Surgical, Family, Medications, Allergies, Care Teams, Patient Goals)    Allergies (verified) Patient has no known allergies.   Current Medications (verified) Outpatient Encounter Medications as of 11/21/2024  Medication Sig   ALPRAZolam  (XANAX ) 1 MG tablet Take 0.5-1 tablets (0.5-1 mg total) by mouth daily as needed for anxiety.   amphetamine -dextroamphetamine  (  ADDERALL) 15 MG  tablet Take 1 tablet by mouth 2 (two) times daily.   atorvastatin  (LIPITOR) 80 MG tablet Take 1 tablet (80 mg total) by mouth daily.   buPROPion  (WELLBUTRIN  XL) 300 MG 24 hr tablet Take 1 tablet (300 mg total) by mouth daily.   cholecalciferol (VITAMIN D3) 25 MCG (1000 UNIT) tablet Take 1,000 Units by mouth daily.   docusate sodium  (COLACE) 100 MG capsule Take 1 tablet once or twice daily as needed for constipation while taking narcotic pain medicine   famotidine  (PEPCID ) 20 MG tablet Take 1 tablet (20 mg total) by mouth daily.   ferrous sulfate  325 (65 FE) MG EC tablet Take 1 tablet (325 mg total) by mouth 2 (two) times daily.   meloxicam  (MOBIC ) 15 MG tablet Take 1 tablet (15 mg total) by mouth daily. (Patient taking differently: Take 15 mg by mouth daily. TAKES PRN)   metFORMIN  (GLUCOPHAGE -XR) 750 MG 24 hr tablet TAKE 2 TABLETS BY MOUTH ONCE DAILY WITH BREAKFAST   omega-3 acid ethyl esters (LOVAZA ) 1 g capsule Take 2 capsules (2 g total) by mouth 2 (two) times daily.   omeprazole  (PRILOSEC) 40 MG capsule Take 1 capsule (40 mg total) by mouth daily.   pregabalin  (LYRICA ) 100 MG capsule Take 1 capsule (100 mg total) by mouth 2 (two) times daily.   tadalafil  (CIALIS ) 5 MG tablet Take 1 tablet (5 mg total) by mouth daily as needed for erectile dysfunction.   tamsulosin  (FLOMAX ) 0.4 MG CAPS capsule Take 1 capsule (0.4 mg total) by mouth daily.   valACYclovir  (VALTREX ) 500 MG tablet TAKE ONE CAPLET BY MOUTH THREE TIMES DAILY (Patient taking differently: TAKE ONE CAPLET BY MOUTH THREE TIMES DAILY TAKES PRN)   amphetamine -dextroamphetamine  (ADDERALL) 15 MG tablet Take 1 tablet by mouth 2 (two) times daily.   amphetamine -dextroamphetamine  (ADDERALL) 15 MG tablet Take 1 tablet by mouth 2 (two) times daily.   No facility-administered encounter medications on file as of 11/21/2024.    History: Past Medical History:  Diagnosis Date   Anemia    Anxiety    Arthritis 2024   Chronic insomnia     Constipation    Depression    Diabetes mellitus without complication (HCC)    Encounter for long-term (current) use of other high-risk medications    GERD (gastroesophageal reflux disease)    History of malignant melanoma    Dr. Charlott   History of squamous cell carcinoma    Hx of basal cell carcinoma    Hyperlipidemia    Leukocytosis    Obesity    Other male erectile dysfunction    Vertigo    1-2x/yr   Past Surgical History:  Procedure Laterality Date   ARM SKIN LESION BIOPSY / EXCISION Right 05/18/2017   COLONOSCOPY     COLONOSCOPY WITH PROPOFOL  N/A 12/15/2016   Procedure: COLONOSCOPY WITH PROPOFOL ;  Surgeon: Rogelia Copping, MD;  Location: [facility];  Service: Endoscopy;  Laterality: N/A;  diabetic - oral meds   COLONOSCOPY WITH PROPOFOL  N/A 03/31/2020   Procedure: COLONOSCOPY WITH PROPOFOL ;  Surgeon: Copping Rogelia, MD;  Location: [facility];  Service: Endoscopy;  Laterality: N/A;   ESOPHAGOGASTRODUODENOSCOPY (EGD) WITH PROPOFOL  N/A 03/31/2020   Procedure: ESOPHAGOGASTRODUODENOSCOPY (EGD) WITH PROPOFOL ;  Surgeon: Copping Rogelia, MD;  Location: [facility];  Service: Endoscopy;  Laterality: N/A;   NASAL SEPTUM SURGERY     POLYPECTOMY  12/15/2016   Procedure: POLYPECTOMY;  Surgeon: Rogelia Copping, MD;  [facility];  Service: Endoscopy;;  SENTINEL NODE BIOPSY Right 05/18/2017   [facility]   Family History  Problem Relation Age of Onset   Diabetes Mother    Heart disease Mother    Hyperlipidemia Mother    CVA Father    Cancer Father        Colon   Depression Father    Anxiety disorder Father    Social History   Occupational History   Occupation: retired  Tobacco Use   Smoking status: Never   Smokeless tobacco: Never   Tobacco comments:    Smoking cessation materials not required  Vaping Use   Vaping status: Never Used  Substance and Sexual Activity   Alcohol use: Yes    Alcohol/week: 0.0 standard drinks of alcohol    Comment: Holidays   Drug  use: No   Sexual activity: Yes    Partners: Female   Tobacco Counseling Counseling given: Not Answered Tobacco comments: Smoking cessation materials not required  SDOH Screenings   Food Insecurity: No Food Insecurity (09/24/2024)  Housing: Low Risk (09/24/2024)  Transportation Needs: No Transportation Needs (09/24/2024)  Utilities: Not At Risk (11/09/2023)  Alcohol Screen: Low Risk (09/24/2024)  Depression (PHQ2-9): Low Risk (09/25/2024)  Financial Resource Strain: Low Risk (09/24/2024)  Physical Activity: Insufficiently Active (09/24/2024)  Social Connections: Moderately Integrated (09/24/2024)  Stress: No Stress Concern Present (09/24/2024)  Tobacco Use: Low Risk (11/21/2024)  Health Literacy: Adequate Health Literacy (11/09/2023)   See flowsheets for full screening details  Depression Screen PHQ 2 & 9 Depression Scale- Over the past 2 weeks, how often have you been bothered by any of the following problems? Little interest or pleasure in doing things: 0 Feeling down, depressed, or hopeless (PHQ Adolescent also includes...irritable): 0 PHQ-2 Total Score: 0 Trouble falling or staying asleep, or sleeping too much: 0 Feeling tired or having little energy: 0 Poor appetite or overeating (PHQ Adolescent also includes...weight loss): 0 Feeling bad about yourself - or that you are a failure or have let yourself or your family down: 0 Trouble concentrating on things, such as reading the newspaper or watching television (PHQ Adolescent also includes...like school work): 0 Moving or speaking so slowly that other people could have noticed. Or the opposite - being so fidgety or restless that you have been moving around a lot more than usual: 0 Thoughts that you would be better off dead, or of hurting yourself in some way: 0 PHQ-9 Total Score: 0 If you checked off any problems, how difficult have these problems made it for you to do your work, take care of things at home, or get along with  other people?: Not difficult at all     Goals Addressed             This Visit's Progress    DIET - INCREASE WATER  INTAKE               Objective:    There were no vitals filed for this visit. There is no height or weight on file to calculate BMI.  Hearing/Vision screen Hearing Screening - Comments:: NEEDS AIDS Vision Screening - Comments:: WEARS GLASSES- WOODARD Immunizations and Health Maintenance Health Maintenance  Topic Date Due   FOOT EXAM  10/09/2024   COVID-19 Vaccine (8 - Pfizer risk 2025-26 season) 02/03/2025   HEMOGLOBIN A1C  03/26/2025   Colonoscopy  03/31/2025   Diabetic kidney evaluation - eGFR measurement  05/23/2025   Diabetic kidney evaluation - Urine ACR  05/23/2025   OPHTHALMOLOGY EXAM  09/23/2025  Medicare Annual Wellness (AWV)  11/21/2025   DTaP/Tdap/Td (5 - Td or Tdap) 10/12/2032   Pneumococcal Vaccine: 50+ Years  Completed   Influenza Vaccine  Completed   Hepatitis C Screening  Completed   Zoster Vaccines- Shingrix   Completed   Meningococcal B Vaccine  Aged Out        Assessment/Plan:  This is a routine wellness examination for Shane Boyd.  Patient Care Team: Sowles, Krichna, MD as PCP - General (Family Medicine) Darliss Rogue, MD as PCP - Cardiology (Cardiology) Maree Jannett POUR, MD as Consulting Physician (Neurology) Nanci Ozell KIDD, MD as Consulting Physician (Surgery) Dasher, Alm LABOR, MD as Consulting Physician (Dermatology) [facility], [facility]  I have personally reviewed and noted the following in the patients chart:   Medical and social history Use of alcohol, tobacco or illicit drugs  Current medications and supplements including opioid prescriptions. Functional ability and status Nutritional status Physical activity Advanced directives List of other physicians Hospitalizations, surgeries, and ER visits in previous 12 months Vitals Screenings to include cognitive, depression, and falls Referrals and  appointments  No orders of the defined types were placed in this encounter.  In addition, I have reviewed and discussed with patient certain preventive protocols, quality metrics, and best practice recommendations. A written personalized care plan for preventive services as well as general preventive health recommendations were provided to patient.   Shane GORMAN Das, LPN   [id number]   Return in 1 year (on 11/21/2025).  After Visit Summary: (MyChart) Due to this being a telephonic visit, the after visit summary with patients personalized plan was offered to patient via MyChart   Nurse Notes: UTD ON SHOTS; AGED OUT OF COLONOSCOPY

## 2024-11-21 NOTE — Patient Instructions (Addendum)
 Shane Boyd,  Thank you for taking the time for your Medicare Wellness Visit. I appreciate your continued commitment to your health goals. Please review the care plan we discussed, and feel free to reach out if I can assist you further.  Please note that Annual Wellness Visits do not include a physical exam. Some assessments may be limited, especially if the visit was conducted virtually. If needed, we may recommend an in-person follow-up with your provider.  Ongoing Care Seeing your primary care provider every 3 to 6 months helps us  monitor your health and provide consistent, personalized care.   Referrals If a referral was made during today's visit and you haven't received any updates within two weeks, please contact the referred provider directly to check on the status.  Recommended Screenings:  Health Maintenance  Topic Date Due   Complete foot exam   10/09/2024   COVID-19 Vaccine (8 - Pfizer risk 2025-26 season) 02/03/2025   Hemoglobin A1C  03/26/2025   Colon Cancer Screening  03/31/2025   Yearly kidney function blood test for diabetes  05/23/2025   Yearly kidney health urinalysis for diabetes  05/23/2025   Eye exam for diabetics  09/23/2025   Medicare Annual Wellness Visit  11/21/2025   DTaP/Tdap/Td vaccine (5 - Td or Tdap) 10/12/2032   Pneumococcal Vaccine for age over 76  Completed   Flu Shot  Completed   Hepatitis C Screening  Completed   Zoster (Shingles) Vaccine  Completed   Meningitis B Vaccine  Aged Out     Vision: Annual vision screenings are recommended for early detection of glaucoma, cataracts, and diabetic retinopathy. These exams can also reveal signs of chronic conditions such as diabetes and high blood pressure.  Dental: Annual dental screenings help detect early signs of oral cancer, gum disease, and other conditions linked to overall health, including heart disease and diabetes.  Please see the attached documents for additional preventive care  recommendations.   NEXT AWV 12/04/25 @ 10:50 AM BY VIDEO

## 2024-11-26 ENCOUNTER — Other Ambulatory Visit: Payer: Self-pay | Admitting: Family Medicine

## 2024-11-26 DIAGNOSIS — F339 Major depressive disorder, recurrent, unspecified: Secondary | ICD-10-CM

## 2024-11-27 NOTE — Telephone Encounter (Signed)
 Requested Prescriptions  Pending Prescriptions Disp Refills   buPROPion  (WELLBUTRIN  XL) 300 MG 24 hr tablet [Pharmacy Med Name: buPROPion  HCl ER (XL) 300 MG Oral Tablet Extended Release 24 Hour] 90 tablet 0    Sig: Take 1 tablet by mouth once daily     Psychiatry: Antidepressants - bupropion  Passed - 11/27/2024  3:04 PM      Passed - Cr in normal range and within 360 days    Creat  Date Value Ref Range Status  05/23/2024 1.12 0.70 - 1.28 mg/dL Final   Creatinine, Urine  Date Value Ref Range Status  05/23/2024 140 20 - 320 mg/dL Final         Passed - AST in normal range and within 360 days    AST  Date Value Ref Range Status  05/23/2024 19 10 - 35 U/L Final         Passed - ALT in normal range and within 360 days    ALT  Date Value Ref Range Status  05/23/2024 17 9 - 46 U/L Final         Passed - Completed PHQ-2 or PHQ-9 in the last 360 days      Passed - Last BP in normal range    BP Readings from Last 1 Encounters:  09/25/24 130/74         Passed - Valid encounter within last 6 months    Recent Outpatient Visits           2 months ago Type 2 diabetes mellitus with diabetic neuropathy, without long-term current use of insulin [facility])   [place] [facility] [place] Mire, MD   6 months ago Type 2 diabetes mellitus with diabetic neuropathy, without long-term current use of insulin [facility])   [place] [facility] [place], Mire, MD   9 months ago Type 2 diabetes mellitus with diabetic neuropathy, without long-term current use of insulin [facility])   [facility] [place], Krichna, MD

## 2024-12-01 ENCOUNTER — Other Ambulatory Visit: Payer: Self-pay | Admitting: Family Medicine

## 2024-12-01 DIAGNOSIS — K219 Gastro-esophageal reflux disease without esophagitis: Secondary | ICD-10-CM

## 2024-12-03 ENCOUNTER — Encounter: Payer: Self-pay | Admitting: Family Medicine

## 2024-12-03 ENCOUNTER — Other Ambulatory Visit: Payer: Self-pay

## 2024-12-03 DIAGNOSIS — K219 Gastro-esophageal reflux disease without esophagitis: Secondary | ICD-10-CM

## 2024-12-03 MED ORDER — OMEPRAZOLE 40 MG PO CPDR: 40.0000 mg | DELAYED_RELEASE_CAPSULE | Freq: Every day | ORAL | 0 refills | Status: DC

## 2024-12-03 NOTE — Telephone Encounter (Signed)
 Refilled 12/03/24 # 90. Requested Prescriptions  Refused Prescriptions Disp Refills   omeprazole  (PRILOSEC) 40 MG capsule [Pharmacy Med Name: Omeprazole  40 MG Oral Capsule Delayed Release] 90 capsule 0    Sig: Take 1 capsule by mouth once daily     Gastroenterology: Proton Pump Inhibitors Passed - 12/03/2024  1:54 PM      Passed - Valid encounter within last 12 months    Recent Outpatient Visits           2 months ago Type 2 diabetes mellitus with diabetic neuropathy, without long-term current use of insulin [facility])   [place] [facility] [place], Dorette, MD   6 months ago Type 2 diabetes mellitus with diabetic neuropathy, without long-term current use of insulin [facility])   [place] [facility] Glenard Dorette, MD   9 months ago Type 2 diabetes mellitus with diabetic neuropathy, without long-term current use of insulin [facility])   [facility] Glenard Dorette, MD

## 2024-12-25 ENCOUNTER — Other Ambulatory Visit: Payer: Self-pay | Admitting: Family Medicine

## 2024-12-25 DIAGNOSIS — N401 Enlarged prostate with lower urinary tract symptoms: Secondary | ICD-10-CM

## 2024-12-26 ENCOUNTER — Other Ambulatory Visit (HOSPITAL_COMMUNITY): Payer: Self-pay

## 2024-12-26 ENCOUNTER — Ambulatory Visit: Admitting: Family Medicine

## 2024-12-26 ENCOUNTER — Telehealth: Payer: Self-pay | Admitting: Pharmacy Technician

## 2024-12-26 ENCOUNTER — Encounter: Payer: Self-pay | Admitting: Family Medicine

## 2024-12-26 VITALS — BP 132/76 | HR 73 | Resp 16 | Ht 69.0 in | Wt 221.1 lb

## 2024-12-26 DIAGNOSIS — N401 Enlarged prostate with lower urinary tract symptoms: Secondary | ICD-10-CM

## 2024-12-26 DIAGNOSIS — E782 Mixed hyperlipidemia: Secondary | ICD-10-CM

## 2024-12-26 DIAGNOSIS — E1140 Type 2 diabetes mellitus with diabetic neuropathy, unspecified: Secondary | ICD-10-CM | POA: Diagnosis not present

## 2024-12-26 DIAGNOSIS — K861 Other chronic pancreatitis: Secondary | ICD-10-CM | POA: Diagnosis not present

## 2024-12-26 DIAGNOSIS — R351 Nocturia: Secondary | ICD-10-CM | POA: Diagnosis not present

## 2024-12-26 DIAGNOSIS — E1169 Type 2 diabetes mellitus with other specified complication: Secondary | ICD-10-CM | POA: Diagnosis not present

## 2024-12-26 DIAGNOSIS — F325 Major depressive disorder, single episode, in full remission: Secondary | ICD-10-CM | POA: Diagnosis not present

## 2024-12-26 DIAGNOSIS — Z7984 Long term (current) use of oral hypoglycemic drugs: Secondary | ICD-10-CM | POA: Diagnosis not present

## 2024-12-26 DIAGNOSIS — K219 Gastro-esophageal reflux disease without esophagitis: Secondary | ICD-10-CM

## 2024-12-26 MED ORDER — TAMSULOSIN HCL 0.4 MG PO CAPS: 0.4000 mg | ORAL_CAPSULE | Freq: Every day | ORAL | 0 refills | Status: AC

## 2024-12-26 MED ORDER — OMEGA-3-ACID ETHYL ESTERS 1 G PO CAPS: 2.0000 g | ORAL_CAPSULE | Freq: Two times a day (BID) | ORAL | 1 refills | Status: AC

## 2024-12-26 MED ORDER — AMPHETAMINE-DEXTROAMPHETAMINE 15 MG PO TABS: 15.0000 mg | ORAL_TABLET | Freq: Two times a day (BID) | ORAL | 0 refills | Status: AC

## 2024-12-26 MED ORDER — FAMOTIDINE 20 MG PO TABS: 20.0000 mg | ORAL_TABLET | Freq: Every day | ORAL | 1 refills | Status: AC

## 2024-12-26 MED ORDER — PREGABALIN 100 MG PO CAPS: 100.0000 mg | ORAL_CAPSULE | Freq: Two times a day (BID) | ORAL | 1 refills | Status: AC

## 2024-12-26 MED ORDER — ATORVASTATIN CALCIUM 80 MG PO TABS: 80.0000 mg | ORAL_TABLET | Freq: Every day | ORAL | 1 refills | Status: AC

## 2024-12-26 MED ORDER — BUPROPION HCL ER (XL) 300 MG PO TB24: 300.0000 mg | ORAL_TABLET | Freq: Every day | ORAL | 1 refills | Status: AC

## 2024-12-26 MED ORDER — TADALAFIL 5 MG PO TABS: 5.0000 mg | ORAL_TABLET | Freq: Every day | ORAL | 1 refills | Status: AC | PRN

## 2024-12-26 MED ORDER — METFORMIN HCL ER 750 MG PO TB24: ORAL_TABLET | ORAL | 1 refills | Status: AC

## 2024-12-26 MED ORDER — OMEPRAZOLE 40 MG PO CPDR: 40.0000 mg | DELAYED_RELEASE_CAPSULE | Freq: Every day | ORAL | 1 refills | Status: AC

## 2024-12-26 NOTE — Progress Notes (Signed)
 Name: Herron Fero.   MRN: [id number]    DOB: 05/22/1951   Date:12/26/2024       Progress Note  Subjective  Chief Complaint  Chief Complaint  Patient presents with   Medical Management of Chronic Issues   Discussed the use of AI scribe software for clinical note transcription with the patient, who gave verbal consent to proceed.  History of Present Illness Aida VEAR Renie Mickey. Merleen is a 74 year old male who presents for a routine follow-up visit.  He recently experienced flu-like symptoms after a family visit during Christmas, with mild symptoms such as a minor cough lasting three to four days. He has since recovered without lingering effects. He had received a flu shot prior to this illness and reports no lingering cough or wheezing.  He reports home glucose readings around 110 mg/dL. He monitors his A1c every six months, with the last reading being 6.5% in October. He takes metformin  and reports no hypoglycemia symptoms. He has adjusted his diet by reducing bread consumption and engages in hobbies such as refinishing furniture for about 45 minutes a day.  He manages dyslipidemia with atorvastatin  and Lovaza , taking two doses of Lovaza  daily. He reports no muscle aches or side effects from atorvastatin .  He experiences neuropathy primarily in his toes, managed with Lyrica . He finds relief with certain footwear, noting that [employer] are particularly comfortable, while [employer] shoes occasionally cause discomfort.  He takes omeprazole  for reflux, which he administers at dinner time, significantly improving his symptoms. He also takes famotidine  every morning and uses Tums occasionally for breakthrough symptoms.  He has a history of chronic calcified pancreatitis noted on a CT scan but reports no digestive issues or abdominal pain.  For benign prostatic hyperplasia, he takes Cialis  and tamsulosin . He reports that he is able to sleep through the night and sometimes does not get up at  night, but no more than once.  He is on Wellbutrin  for depression and Adderall for motivation and energy. He feels well with this regimen, noting that he feels 'pretty good' and functions well day-to-day. No side effects from current depression and energy management medications.    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) 09/16/2016   GAD (generalized anxiety disorder) 09/16/2016   Genital herpes 06/08/2016   Dizziness 03/09/2016   History of nonmelanoma skin cancer 09/30/2015   Neck pain 07/29/2015   Constipation 07/19/2015   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) 07/19/2015   Gastric reflux 07/19/2015   History of shingles 07/19/2015   History of basal cell cancer 07/19/2015   H/O Malignant melanoma 07/19/2015   Personal history of malignant neoplasm of head and neck 07/19/2015   Chronic recurrent major depressive disorder 07/19/2015   Obesity (BMI 30.0-34.9) 07/19/2015   ED (erectile dysfunction) 07/19/2015    Past Surgical History:  Procedure Laterality Date   ARM SKIN LESION  BIOPSY / EXCISION Right 05/18/2017   COLONOSCOPY     COLONOSCOPY WITH PROPOFOL  N/A 12/15/2016   Procedure: COLONOSCOPY WITH PROPOFOL ;  Surgeon: Rogelia Copping, MD;  [facility];  Service: Endoscopy;  Laterality: N/A;  diabetic - oral meds   COLONOSCOPY WITH PROPOFOL   N/A 03/31/2020   Procedure: COLONOSCOPY WITH PROPOFOL ;  Surgeon: Copping Rogelia, MD;  Location[facility];  Service: Endoscopy;  Laterality: N/A;   ESOPHAGOGASTRODUODENOSCOPY (EGD) WITH PROPOFOL  N/A 03/31/2020   Procedure: ESOPHAGOGASTRODUODENOSCOPY (EGD) WITH PROPOFOL ;  Surgeon: Copping Rogelia, MD;  Location: [facility] ENDOSCOPY;  Service: Endoscopy;  Laterality: N/A;   NASAL SEPTUM SURGERY     POLYPECTOMY  12/15/2016   Procedure: POLYPECTOMY;  Surgeon: Rogelia Copping, MD;  Location[facility];  Service: Endoscopy;;   SENTINEL NODE BIOPSY Right 05/18/2017   [facility]    Family History  Problem Relation Age of Onset   Diabetes Mother    Heart disease Mother    Hyperlipidemia Mother    CVA Father    Cancer Father        Colon   Depression Father    Anxiety disorder Father     Social History   Tobacco Use   Smoking status: Never   Smokeless tobacco: Never   Tobacco comments:    Smoking cessation materials not required  Substance Use Topics   Alcohol use: Yes    Alcohol/week: 0.0 standard drinks of alcohol    Comment: Holidays    Current Medications[1]  Allergies[2]  I personally reviewed active problem list, medication list, allergies, family history with the patient/caregiver today.   ROS  Ten systems reviewed and is negative except as mentioned in HPI    Objective Physical Exam CONSTITUTIONAL: Patient appears well-developed and well-nourished. No distress. HEENT: Head atraumatic, normocephalic, neck supple. CARDIOVASCULAR: Normal rate, regular rhythm and normal heart sounds. No murmur heard. No BLE edema. Extremities normal. PULMONARY: Effort normal and breath sounds normal. No respiratory distress. ABDOMINAL: There is no tenderness or distention. MUSCULOSKELETAL: Normal gait. Without gross motor or sensory deficit. PSYCHIATRIC: Patient has a normal mood and affect. Behavior is normal. Judgment and thought content normal. SKIN: Callus present on foot. Wart-like  lesion on third toe.  Vitals:   12/26/24 1043  BP: 132/76  Pulse: 73  Resp: 16  SpO2: 96%  Weight: 221 lb 1.6 oz (100.3 kg)  Height: 5' 9 (1.753 m)    Body mass index is 32.65 kg/m.   Diabetic Foot Exam:  Diabetic foot exam was performed with the following findings:   Normal sensation of 10g monofilament Intact posterior tibialis and dorsalis pedis pulses Lesion on top of second toe left foot, possible wart? Callus formation on both first toes      PHQ2/9:    12/26/2024   10:43 AM 11/21/2024    1:01 PM 09/25/2024   10:13 AM 05/23/2024   11:00 AM 02/08/2024   11:24 AM  Depression screen PHQ 2/9  Decreased Interest 0 0 0 0 0  Down, Depressed, Hopeless 0 0 0 0 0  PHQ - 2 Score 0 0 0 0 0  Altered sleeping 0 0 0 0 0  Tired, decreased energy 0 0 0 0 0  Change in appetite 0 0 0 0 0  Feeling bad or failure about yourself  0 0 0 0 0  Trouble concentrating 0 0 0 0 0  Moving slowly or fidgety/restless 0 0 0 0 0  Suicidal thoughts 0 0 0 0 0  PHQ-9 Score  0 0 0  0  0   Difficult doing work/chores Not difficult at all Not difficult at all Not difficult at all Not difficult at all Not difficult at all     Data saved with a previous flowsheet row definition    phq 9 is negative  Fall Risk:    12/26/2024   10:42 AM 11/21/2024   12:46 PM 11/21/2024    7:53 AM 09/25/2024   10:13 AM 05/23/2024   10:56 AM  Fall Risk   Falls in the past year? 1 1 1   0 0  Number falls in past yr: 0 0 0  0 0  Injury with Fall? 0 0 0  0  0   Risk for fall due to : No Fall Risks No Fall Risks  No Fall Risks No Fall Risks  Follow up Falls evaluation completed Falls evaluation completed;Falls prevention discussed  Falls evaluation completed Falls prevention discussed;Education provided;Falls evaluation completed     Manually entered by patient   Data saved with a previous flowsheet row definition      Assessment & Plan Type 2 diabetes mellitus complicated by diabetic neuropathy and  dyslipidemia Diabetes well-controlled with A1c of 6.5%. Neuropathy managed with pregabalin . Dyslipidemia managed with atorvastatin  and Lovaza . - Continue metformin  for diabetes management. - Continue pregabalin  for neuropathy. - Continue atorvastatin  and Lovaza  for dyslipidemia. - Monitor A1c every six months.  Major depressive disorder in remission Depression in remission with Wellbutrin  and Adderall. Discussed Adderall side effects, no issues reported. Encouraged dose reduction to assess tolerance. - Continue Wellbutrin  300 mg daily. - Try reducing Adderall to 15 mg once daily, then consider reducing to 10 mg twice daily if tolerated.  Benign prostatic hyperplasia with lower urinary tract symptoms Symptoms well-managed with Cialis  and tamsulosin . - Continue Cialis  5 mg daily. - Continue tamsulosin  as prescribed.  Gastroesophageal reflux disease Reflux well-controlled with omeprazole  and famotidine . Uses Tums as needed. - Continue omeprazole  before the largest meal of the day. - Consider reducing famotidine  use and monitor symptoms.  Chronic calcific pancreatitis Incidental finding on CT, asymptomatic.        [1]  Current Outpatient Medications:    ALPRAZolam  (XANAX ) 1 MG tablet, Take 0.5-1 tablets (0.5-1 mg total) by mouth daily as needed for anxiety., Disp: 10 tablet, Rfl: 0   cholecalciferol (VITAMIN D3) 25 MCG (1000 UNIT) tablet, Take 1,000 Units by mouth daily., Disp: , Rfl:    docusate sodium  (COLACE) 100 MG capsule, Take 1 tablet once or twice daily as needed for constipation while taking narcotic pain medicine, Disp: 30 capsule, Rfl: 0   ferrous sulfate  325 (65 FE) MG EC tablet, Take 1 tablet (325 mg total) by mouth 2 (two) times daily., Disp: 180 tablet, Rfl: 0   meloxicam  (MOBIC ) 15 MG tablet, Take 1 tablet (15 mg total) by mouth daily. (Patient taking differently: Take 15 mg by mouth daily. TAKES PRN), Disp: 90 tablet, Rfl: 0   valACYclovir  (VALTREX ) 500 MG tablet,  TAKE ONE CAPLET BY MOUTH THREE TIMES DAILY (Patient taking differently: TAKE ONE CAPLET BY MOUTH THREE TIMES DAILY TAKES PRN), Disp: 90 tablet, Rfl: 1   amphetamine -dextroamphetamine  (ADDERALL) 15 MG tablet, Take 1 tablet by mouth 2 (two) times daily., Disp: 60 tablet, Rfl: 0   amphetamine -dextroamphetamine  (ADDERALL) 15 MG tablet, Take 1 tablet by mouth 2 (two) times daily., Disp: 60 tablet, Rfl: 0   amphetamine -dextroamphetamine  (ADDERALL) 15 MG tablet, Take 1 tablet by mouth 2 (two) times daily., Disp: 60 tablet,  Rfl: 0   atorvastatin  (LIPITOR) 80 MG tablet, Take 1 tablet (80 mg total) by mouth daily., Disp: 90 tablet, Rfl: 1   buPROPion  (WELLBUTRIN  XL) 300 MG 24 hr tablet, Take 1 tablet (300 mg total) by mouth daily., Disp: 90 tablet, Rfl: 1   famotidine  (PEPCID ) 20 MG tablet, Take 1 tablet (20 mg total) by mouth daily., Disp: 90 tablet, Rfl: 1   metFORMIN  (GLUCOPHAGE -XR) 750 MG 24 hr tablet, TAKE 2 TABLETS BY MOUTH ONCE DAILY WITH BREAKFAST, Disp: 180 tablet, Rfl: 1   omega-3 acid ethyl esters (LOVAZA ) 1 g capsule, Take 2 capsules (2 g total) by mouth 2 (two) times daily., Disp: 360 capsule, Rfl: 1   omeprazole  (PRILOSEC) 40 MG capsule, Take 1 capsule (40 mg total) by mouth daily., Disp: 90 capsule, Rfl: 1   pregabalin  (LYRICA ) 100 MG capsule, Take 1 capsule (100 mg total) by mouth 2 (two) times daily., Disp: 180 capsule, Rfl: 1   tadalafil  (CIALIS ) 5 MG tablet, Take 1 tablet (5 mg total) by mouth daily as needed for erectile dysfunction., Disp: 90 tablet, Rfl: 1   tamsulosin  (FLOMAX ) 0.4 MG CAPS capsule, Take 1 capsule (0.4 mg total) by mouth daily., Disp: 90 capsule, Rfl: 0 [2] No Known Allergies

## 2024-12-26 NOTE — Telephone Encounter (Signed)
 Pharmacy Patient Advocate Encounter   Received notification from [facility] KEY that prior authorization for Tadalafil  5MG  tablets  is required/requested.   Insurance verification completed.   The patient is insured through [place].   Per test claim: PA required; PA started via CoverMyMeds. KEY [id number] . Waiting for clinical questions to populate.

## 2024-12-26 NOTE — Telephone Encounter (Signed)
 Requested Prescriptions  Pending Prescriptions Disp Refills   tamsulosin  (FLOMAX ) 0.4 MG CAPS capsule [Pharmacy Med Name: Tamsulosin  HCl 0.4 MG Oral Capsule] 90 capsule 0    Sig: Take 1 capsule by mouth once daily     Urology: Alpha-Adrenergic Blocker Passed - 12/26/2024 10:41 AM      Passed - PSA in normal range and within 360 days    PSA  Date Value Ref Range Status  05/23/2024 1.88 < OR = 4.00 ng/mL Final    Comment:    The total PSA value from this assay system is  standardized against the WHO standard. The test  result will be approximately 20% lower when compared  to the equimolar-standardized total PSA (Beckman  Coulter). Comparison of serial PSA results should be  interpreted with this fact in mind. . This test was performed using the Siemens  chemiluminescent method. Values obtained from  different assay methods cannot be used interchangeably. PSA levels, regardless of value, should not be interpreted as absolute evidence of the presence or absence of disease.   09/10/2012 Normal, 1.0  Final         Passed - Last BP in normal range    BP Readings from Last 1 Encounters:  09/25/24 130/74         Passed - Valid encounter within last 12 months    Recent Outpatient Visits           3 months ago Type 2 diabetes mellitus with diabetic neuropathy, without long-term current use of insulin [facility])   [place] [facility] Glen[place] Mire, MD   7 months ago Type 2 diabetes mellitus with diabetic neuropathy, without long-term current use of insulin [facility])   [place] [facility] [place], Mire, MD   10 months ago Type 2 diabetes mellitus with diabetic neuropathy, without long-term current use of insulin [facility])   [facility] Glenard Mire, MD

## 2024-12-27 ENCOUNTER — Other Ambulatory Visit (HOSPITAL_COMMUNITY): Payer: Self-pay

## 2024-12-27 NOTE — Telephone Encounter (Signed)
 Pharmacy Patient Advocate Encounter  Received notification from [employer] that Prior Authorization for Tadalafil  5MG  tablets has been APPROVED from 12/05/24 to 12/04/25. Ran test claim, Copay is $14.16 for 3 months. This test claim was processed through [facility]- copay amounts may vary at other pharmacies due to pharmacy/plan contracts, or as the patient moves through the different stages of their insurance plan.   PA #/Case ID/Reference #: [id number]

## 2025-04-07 ENCOUNTER — Ambulatory Visit: Admitting: Family Medicine

## 2025-12-04 ENCOUNTER — Ambulatory Visit
